# Patient Record
Sex: Male | Born: 1935 | Race: Black or African American | Hispanic: No | Marital: Married | Smoking: Former smoker
Health system: Southern US, Community
[De-identification: ages and names within clinical notes are randomized; demographics above are authoritative.]

## PROBLEM LIST (undated history)

## (undated) DIAGNOSIS — J9611 Chronic respiratory failure with hypoxia: Secondary | ICD-10-CM

## (undated) DIAGNOSIS — I10 Essential (primary) hypertension: Secondary | ICD-10-CM

## (undated) DIAGNOSIS — J9602 Acute respiratory failure with hypercapnia: Secondary | ICD-10-CM

## (undated) DIAGNOSIS — R748 Abnormal levels of other serum enzymes: Secondary | ICD-10-CM

## (undated) DIAGNOSIS — E7800 Pure hypercholesterolemia, unspecified: Secondary | ICD-10-CM

## (undated) DIAGNOSIS — R9431 Abnormal electrocardiogram [ECG] [EKG]: Secondary | ICD-10-CM

## (undated) DIAGNOSIS — D649 Anemia, unspecified: Secondary | ICD-10-CM

## (undated) DIAGNOSIS — M549 Dorsalgia, unspecified: Secondary | ICD-10-CM

## (undated) DIAGNOSIS — I639 Cerebral infarction, unspecified: Secondary | ICD-10-CM

## (undated) DIAGNOSIS — J189 Pneumonia, unspecified organism: Secondary | ICD-10-CM

## (undated) DIAGNOSIS — G8929 Other chronic pain: Secondary | ICD-10-CM

## (undated) DIAGNOSIS — J449 Chronic obstructive pulmonary disease, unspecified: Secondary | ICD-10-CM

## (undated) HISTORY — PX: INGUINAL HERNIA REPAIR: SUR1180

## (undated) HISTORY — PX: OTHER SURGICAL HISTORY: SHX169

---

## 2011-10-06 DIAGNOSIS — J9602 Acute respiratory failure with hypercapnia: Secondary | ICD-10-CM

## 2011-10-06 HISTORY — DX: Acute respiratory failure with hypercapnia: J96.02

## 2011-10-12 ENCOUNTER — Encounter (HOSPITAL_COMMUNITY): Payer: Self-pay | Admitting: Emergency Medicine

## 2011-10-12 ENCOUNTER — Emergency Department (HOSPITAL_COMMUNITY): Payer: Medicare PPO

## 2011-10-12 ENCOUNTER — Inpatient Hospital Stay (HOSPITAL_COMMUNITY)
Admission: EM | Admit: 2011-10-12 | Discharge: 2011-10-24 | DRG: 207 | Disposition: A | Discharge status: Skilled Nursing Facility | Payer: Medicare PPO | Attending: Internal Medicine | Admitting: Internal Medicine

## 2011-10-12 DIAGNOSIS — R0789 Other chest pain: Secondary | ICD-10-CM | POA: Diagnosis present

## 2011-10-12 DIAGNOSIS — N179 Acute kidney failure, unspecified: Secondary | ICD-10-CM

## 2011-10-12 DIAGNOSIS — G9340 Encephalopathy, unspecified: Secondary | ICD-10-CM

## 2011-10-12 DIAGNOSIS — R5383 Other fatigue: Secondary | ICD-10-CM

## 2011-10-12 DIAGNOSIS — J9602 Acute respiratory failure with hypercapnia: Secondary | ICD-10-CM

## 2011-10-12 DIAGNOSIS — R0902 Hypoxemia: Secondary | ICD-10-CM

## 2011-10-12 DIAGNOSIS — R748 Abnormal levels of other serum enzymes: Secondary | ICD-10-CM

## 2011-10-12 DIAGNOSIS — R7309 Other abnormal glucose: Secondary | ICD-10-CM | POA: Diagnosis not present

## 2011-10-12 DIAGNOSIS — J9611 Chronic respiratory failure with hypoxia: Secondary | ICD-10-CM

## 2011-10-12 DIAGNOSIS — R3989 Other symptoms and signs involving the genitourinary system: Secondary | ICD-10-CM | POA: Diagnosis present

## 2011-10-12 DIAGNOSIS — Z9981 Dependence on supplemental oxygen: Secondary | ICD-10-CM

## 2011-10-12 DIAGNOSIS — J9610 Chronic respiratory failure, unspecified whether with hypoxia or hypercapnia: Secondary | ICD-10-CM

## 2011-10-12 DIAGNOSIS — E870 Hyperosmolality and hypernatremia: Secondary | ICD-10-CM

## 2011-10-12 DIAGNOSIS — D649 Anemia, unspecified: Secondary | ICD-10-CM | POA: Diagnosis present

## 2011-10-12 DIAGNOSIS — R071 Chest pain on breathing: Secondary | ICD-10-CM | POA: Diagnosis present

## 2011-10-12 DIAGNOSIS — Z79899 Other long term (current) drug therapy: Secondary | ICD-10-CM

## 2011-10-12 DIAGNOSIS — J189 Pneumonia, unspecified organism: Secondary | ICD-10-CM

## 2011-10-12 DIAGNOSIS — Z7982 Long term (current) use of aspirin: Secondary | ICD-10-CM

## 2011-10-12 DIAGNOSIS — J329 Chronic sinusitis, unspecified: Secondary | ICD-10-CM

## 2011-10-12 DIAGNOSIS — J9620 Acute and chronic respiratory failure, unspecified whether with hypoxia or hypercapnia: Secondary | ICD-10-CM | POA: Diagnosis present

## 2011-10-12 DIAGNOSIS — I10 Essential (primary) hypertension: Secondary | ICD-10-CM

## 2011-10-12 DIAGNOSIS — E871 Hypo-osmolality and hyponatremia: Secondary | ICD-10-CM | POA: Diagnosis present

## 2011-10-12 DIAGNOSIS — Z87891 Personal history of nicotine dependence: Secondary | ICD-10-CM

## 2011-10-12 DIAGNOSIS — E872 Acidosis: Secondary | ICD-10-CM

## 2011-10-12 DIAGNOSIS — T380X5A Adverse effect of glucocorticoids and synthetic analogues, initial encounter: Secondary | ICD-10-CM | POA: Diagnosis not present

## 2011-10-12 DIAGNOSIS — G8929 Other chronic pain: Secondary | ICD-10-CM

## 2011-10-12 DIAGNOSIS — M6282 Rhabdomyolysis: Secondary | ICD-10-CM

## 2011-10-12 DIAGNOSIS — R000 Tachycardia, unspecified: Secondary | ICD-10-CM

## 2011-10-12 DIAGNOSIS — J9600 Acute respiratory failure, unspecified whether with hypoxia or hypercapnia: Secondary | ICD-10-CM

## 2011-10-12 DIAGNOSIS — J441 Chronic obstructive pulmonary disease with (acute) exacerbation: Principal | ICD-10-CM

## 2011-10-12 DIAGNOSIS — M549 Dorsalgia, unspecified: Secondary | ICD-10-CM | POA: Diagnosis present

## 2011-10-12 DIAGNOSIS — T50905A Adverse effect of unspecified drugs, medicaments and biological substances, initial encounter: Secondary | ICD-10-CM

## 2011-10-12 DIAGNOSIS — R9431 Abnormal electrocardiogram [ECG] [EKG]: Secondary | ICD-10-CM

## 2011-10-12 DIAGNOSIS — R739 Hyperglycemia, unspecified: Secondary | ICD-10-CM | POA: Diagnosis not present

## 2011-10-12 DIAGNOSIS — E875 Hyperkalemia: Secondary | ICD-10-CM | POA: Diagnosis present

## 2011-10-12 DIAGNOSIS — R1310 Dysphagia, unspecified: Secondary | ICD-10-CM

## 2011-10-12 DIAGNOSIS — R5381 Other malaise: Secondary | ICD-10-CM

## 2011-10-12 HISTORY — DX: Anemia, unspecified: D64.9

## 2011-10-12 HISTORY — DX: Dorsalgia, unspecified: M54.9

## 2011-10-12 HISTORY — DX: Essential (primary) hypertension: I10

## 2011-10-12 HISTORY — DX: Cerebral infarction, unspecified: I63.9

## 2011-10-12 HISTORY — DX: Other chronic pain: G89.29

## 2011-10-12 HISTORY — DX: Acute respiratory failure with hypercapnia: J96.02

## 2011-10-12 HISTORY — DX: Abnormal levels of other serum enzymes: R74.8

## 2011-10-12 HISTORY — DX: Abnormal electrocardiogram (ECG) (EKG): R94.31

## 2011-10-12 HISTORY — DX: Chronic obstructive pulmonary disease, unspecified: J44.9

## 2011-10-12 HISTORY — DX: Pure hypercholesterolemia, unspecified: E78.00

## 2011-10-12 HISTORY — DX: Chronic respiratory failure with hypoxia: J96.11

## 2011-10-12 LAB — COMPREHENSIVE METABOLIC PANEL
ALT: 27 U/L (ref 0–53)
Alkaline Phosphatase: 63 U/L (ref 39–117)
BUN: 19 mg/dL (ref 6–23)
CO2: 33 mEq/L — ABNORMAL HIGH (ref 19–32)
Chloride: 90 mEq/L — ABNORMAL LOW (ref 96–112)
GFR calc Af Amer: 63 mL/min — ABNORMAL LOW (ref >90)
Glucose, Bld: 124 mg/dL — ABNORMAL HIGH (ref 70–99)
Potassium: 3.7 mEq/L (ref 3.5–5.1)
Sodium: 133 mEq/L — ABNORMAL LOW (ref 135–145)
Total Bilirubin: 0.6 mg/dL (ref 0.3–1.2)

## 2011-10-12 LAB — URINALYSIS, ROUTINE W REFLEX MICROSCOPIC
Bilirubin Urine: NEGATIVE
Ketones, ur: NEGATIVE mg/dL
Leukocytes, UA: NEGATIVE
Nitrite: NEGATIVE
Protein, ur: 100 mg/dL — AB
pH: 6 (ref 5.0–8.0)

## 2011-10-12 LAB — MRSA PCR SCREENING: MRSA by PCR: NEGATIVE

## 2011-10-12 LAB — CBC WITH DIFFERENTIAL/PLATELET
Eosinophils Absolute: 0.1 10*3/uL (ref 0.0–0.7)
Hemoglobin: 12.2 g/dL — ABNORMAL LOW (ref 13.0–17.0)
Lymphocytes Relative: 13 % (ref 12–46)
Lymphs Abs: 1.2 10*3/uL (ref 0.7–4.0)
MCH: 29.2 pg (ref 26.0–34.0)
Monocytes Relative: 8 % (ref 3–12)
Neutro Abs: 7.2 10*3/uL (ref 1.7–7.7)
Neutrophils Relative %: 78 % — ABNORMAL HIGH (ref 43–77)
Platelets: 164 10*3/uL (ref 150–400)
RBC: 4.18 MIL/uL — ABNORMAL LOW (ref 4.22–5.81)
WBC: 9.2 10*3/uL (ref 4.0–10.5)

## 2011-10-12 LAB — BLOOD GAS, ARTERIAL
Acid-Base Excess: 4.8 mmol/L — ABNORMAL HIGH (ref 0.0–2.0)
Bicarbonate: 30.7 mEq/L — ABNORMAL HIGH (ref 20.0–24.0)
Patient temperature: 37
TCO2: 25.5 mmol/L (ref 0–100)

## 2011-10-12 LAB — PRO B NATRIURETIC PEPTIDE: Pro B Natriuretic peptide (BNP): 408.7 pg/mL (ref 0–450)

## 2011-10-12 LAB — D-DIMER, QUANTITATIVE: D-Dimer, Quant: 0.87 ug/mL-FEU — ABNORMAL HIGH (ref 0.00–0.48)

## 2011-10-12 LAB — CARDIAC PANEL(CRET KIN+CKTOT+MB+TROPI)
Relative Index: 1.6 (ref 0.0–2.5)
Total CK: 1925 U/L — ABNORMAL HIGH (ref 7–232)
Troponin I: <0.3 ng/mL (ref <0.30)

## 2011-10-12 LAB — PROCALCITONIN: Procalcitonin: 0.12 ng/mL

## 2011-10-12 LAB — TROPONIN I: Troponin I: <0.3 ng/mL (ref <0.30)

## 2011-10-12 MED ORDER — LEVALBUTEROL HCL 0.63 MG/3ML IN NEBU
0.6300 mg | INHALATION_SOLUTION | RESPIRATORY_TRACT | Status: DC | PRN
  Administered 2011-10-18: 0.63 mg via RESPIRATORY_TRACT
  Filled 2011-10-12 (×2): qty 3

## 2011-10-12 MED ORDER — ACETAMINOPHEN 650 MG RE SUPP: 650.0000 mg | Freq: Four times a day (QID) | RECTAL | Status: DC | PRN

## 2011-10-12 MED ORDER — NABUMETONE 500 MG PO TABS
500.0000 mg | ORAL_TABLET | Freq: Two times a day (BID) | ORAL | Status: DC | PRN
  Administered 2011-10-12: 500 mg via ORAL
  Filled 2011-10-12 (×2): qty 1

## 2011-10-12 MED ORDER — ALBUTEROL SULFATE (5 MG/ML) 0.5% IN NEBU
INHALATION_SOLUTION | RESPIRATORY_TRACT | Status: AC
  Administered 2011-10-12: 10:00:00
  Filled 2011-10-12: qty 0.5

## 2011-10-12 MED ORDER — DOCUSATE SODIUM 100 MG PO CAPS
100.0000 mg | ORAL_CAPSULE | Freq: Two times a day (BID) | ORAL | Status: DC
  Administered 2011-10-12 – 2011-10-14 (×5): 100 mg via ORAL
  Filled 2011-10-12 (×8): qty 1

## 2011-10-12 MED ORDER — ACETAMINOPHEN 325 MG PO TABS
650.0000 mg | ORAL_TABLET | Freq: Four times a day (QID) | ORAL | Status: DC | PRN
  Administered 2011-10-18: 650 mg via ORAL
  Filled 2011-10-12: qty 2

## 2011-10-12 MED ORDER — OXYCODONE HCL 5 MG PO TABS
5.0000 mg | ORAL_TABLET | ORAL | Status: DC | PRN
  Administered 2011-10-14 – 2011-10-15 (×3): 5 mg via ORAL
  Filled 2011-10-12 (×4): qty 1

## 2011-10-12 MED ORDER — IPRATROPIUM BROMIDE 0.02 % IN SOLN
0.5000 mg | RESPIRATORY_TRACT | Status: DC
  Administered 2011-10-12 – 2011-10-24 (×69): 0.5 mg via RESPIRATORY_TRACT
  Filled 2011-10-12 (×70): qty 2.5

## 2011-10-12 MED ORDER — LEVALBUTEROL HCL 0.63 MG/3ML IN NEBU
0.6300 mg | INHALATION_SOLUTION | RESPIRATORY_TRACT | Status: DC
  Administered 2011-10-12 – 2011-10-24 (×70): 0.63 mg via RESPIRATORY_TRACT
  Filled 2011-10-12 (×70): qty 3

## 2011-10-12 MED ORDER — MAGNESIUM SULFATE 40 MG/ML IJ SOLN
2.0000 g | Freq: Once | INTRAMUSCULAR | Status: AC
  Administered 2011-10-12: 2 g via INTRAVENOUS
  Filled 2011-10-12: qty 50

## 2011-10-12 MED ORDER — ALBUTEROL (5 MG/ML) CONTINUOUS INHALATION SOLN
10.0000 mg/h | INHALATION_SOLUTION | Freq: Once | RESPIRATORY_TRACT | Status: AC
  Administered 2011-10-14: 10 mg/h via RESPIRATORY_TRACT
  Filled 2011-10-12: qty 20

## 2011-10-12 MED ORDER — IPRATROPIUM BROMIDE 0.02 % IN SOLN
RESPIRATORY_TRACT | Status: AC
  Filled 2011-10-12: qty 2.5

## 2011-10-12 MED ORDER — METHYLPREDNISOLONE SODIUM SUCC 125 MG IJ SOLR
INTRAMUSCULAR | Status: AC
  Administered 2011-10-12: 80 mg via INTRAVENOUS
  Filled 2011-10-12: qty 2

## 2011-10-12 MED ORDER — GUAIFENESIN ER 600 MG PO TB12
600.0000 mg | ORAL_TABLET | Freq: Two times a day (BID) | ORAL | Status: DC
  Administered 2011-10-12 – 2011-10-14 (×5): 600 mg via ORAL
  Filled 2011-10-12 (×8): qty 1

## 2011-10-12 MED ORDER — AMLODIPINE BESYLATE 5 MG PO TABS
5.0000 mg | ORAL_TABLET | Freq: Every day | ORAL | Status: DC
  Administered 2011-10-12 – 2011-10-14 (×3): 5 mg via ORAL
  Filled 2011-10-12 (×3): qty 1

## 2011-10-12 MED ORDER — METHYLPREDNISOLONE SODIUM SUCC 125 MG IJ SOLR: 125.0000 mg | Freq: Once | INTRAMUSCULAR | Status: DC

## 2011-10-12 MED ORDER — ONDANSETRON HCL 4 MG/2ML IJ SOLN: 4.0000 mg | Freq: Four times a day (QID) | INTRAMUSCULAR | Status: DC | PRN

## 2011-10-12 MED ORDER — IOHEXOL 350 MG/ML SOLN
100.0000 mL | Freq: Once | INTRAVENOUS | Status: AC | PRN
  Administered 2011-10-12: 100 mL via INTRAVENOUS

## 2011-10-12 MED ORDER — ALUM & MAG HYDROXIDE-SIMETH 200-200-20 MG/5ML PO SUSP: 30.0000 mL | Freq: Four times a day (QID) | ORAL | Status: DC | PRN

## 2011-10-12 MED ORDER — POTASSIUM CHLORIDE IN NACL 20-0.9 MEQ/L-% IV SOLN
INTRAVENOUS | Status: DC
  Administered 2011-10-12 – 2011-10-13 (×3): via INTRAVENOUS

## 2011-10-12 MED ORDER — LOSARTAN POTASSIUM 50 MG PO TABS
100.0000 mg | ORAL_TABLET | Freq: Every day | ORAL | Status: DC
  Administered 2011-10-12 – 2011-10-14 (×3): 100 mg via ORAL
  Filled 2011-10-12: qty 2
  Filled 2011-10-12: qty 1
  Filled 2011-10-12 (×2): qty 2
  Filled 2011-10-12: qty 1

## 2011-10-12 MED ORDER — GABAPENTIN 300 MG PO CAPS
300.0000 mg | ORAL_CAPSULE | Freq: Three times a day (TID) | ORAL | Status: DC
  Administered 2011-10-12 – 2011-10-14 (×6): 300 mg via ORAL
  Filled 2011-10-12 (×9): qty 1

## 2011-10-12 MED ORDER — CLONIDINE HCL 0.1 MG PO TABS
0.1000 mg | ORAL_TABLET | Freq: Every day | ORAL | Status: DC
  Administered 2011-10-12 – 2011-10-13 (×2): 0.1 mg via ORAL
  Filled 2011-10-12 (×2): qty 1

## 2011-10-12 MED ORDER — FAMOTIDINE 20 MG PO TABS
20.0000 mg | ORAL_TABLET | Freq: Two times a day (BID) | ORAL | Status: DC
  Administered 2011-10-12 – 2011-10-14 (×5): 20 mg via ORAL
  Filled 2011-10-12 (×5): qty 1

## 2011-10-12 MED ORDER — ASPIRIN EC 81 MG PO TBEC
81.0000 mg | DELAYED_RELEASE_TABLET | Freq: Every day | ORAL | Status: DC
  Administered 2011-10-12 – 2011-10-14 (×3): 81 mg via ORAL
  Filled 2011-10-12 (×5): qty 1

## 2011-10-12 MED ORDER — SIMVASTATIN 20 MG PO TABS
20.0000 mg | ORAL_TABLET | Freq: Every day | ORAL | Status: DC
  Administered 2011-10-12: 20 mg via ORAL
  Filled 2011-10-12 (×2): qty 1

## 2011-10-12 MED ORDER — METHYLPREDNISOLONE SODIUM SUCC 125 MG IJ SOLR
80.0000 mg | Freq: Four times a day (QID) | INTRAMUSCULAR | Status: DC
  Administered 2011-10-12 – 2011-10-17 (×20): 80 mg via INTRAVENOUS
  Filled 2011-10-12 (×19): qty 2

## 2011-10-12 MED ORDER — MAGNESIUM SULFATE 50 % IJ SOLN
2.0000 g | Freq: Once | INTRAVENOUS | Status: DC
  Filled 2011-10-12: qty 4

## 2011-10-12 MED ORDER — ENOXAPARIN SODIUM 40 MG/0.4ML ~~LOC~~ SOLN
40.0000 mg | SUBCUTANEOUS | Status: DC
  Administered 2011-10-12 – 2011-10-23 (×12): 40 mg via SUBCUTANEOUS
  Filled 2011-10-12 (×12): qty 0.4

## 2011-10-12 MED ORDER — SODIUM CHLORIDE 0.9 % IJ SOLN
INTRAMUSCULAR | Status: AC
  Filled 2011-10-12: qty 6

## 2011-10-12 MED ORDER — BENZONATATE 100 MG PO CAPS
100.0000 mg | ORAL_CAPSULE | Freq: Three times a day (TID) | ORAL | Status: DC
  Administered 2011-10-12 – 2011-10-14 (×6): 100 mg via ORAL
  Filled 2011-10-12 (×7): qty 1

## 2011-10-12 MED ORDER — DEXTROSE 5 % IV SOLN
500.0000 mg | INTRAVENOUS | Status: DC
  Administered 2011-10-12 – 2011-10-17 (×6): 500 mg via INTRAVENOUS
  Filled 2011-10-12 (×9): qty 500

## 2011-10-12 MED ORDER — CEFTRIAXONE SODIUM 1 G IJ SOLR
1.0000 g | INTRAMUSCULAR | Status: DC
  Administered 2011-10-12 – 2011-10-23 (×12): 1 g via INTRAVENOUS
  Filled 2011-10-12 (×15): qty 10

## 2011-10-12 MED ORDER — SODIUM CHLORIDE 0.9 % IV SOLN
INTRAVENOUS | Status: DC
  Administered 2011-10-12: 10:00:00 via INTRAVENOUS

## 2011-10-12 MED ORDER — ONDANSETRON HCL 4 MG PO TABS: 4.0000 mg | ORAL_TABLET | Freq: Four times a day (QID) | ORAL | Status: DC | PRN

## 2011-10-12 NOTE — ED Notes (Signed)
CRITICAL VALUE ALERT  Critical value received:  Ph 7.30  CO2 63.2  PO2 62.2  so2 88.5  Bicarb 30.7  Date of notification:  10/12/2011  Time of notification:  1407  Critical value read back:yes  Nurse who received alert:  c Yariel Ferraris rn  MD notified (1st page):  Dr Sherrie Mustache  Time of first page:  1407  MD notified (2nd page):  Time of second page:  Responding MD:  Dr Sherrie Mustache  Time MD responded:  [phone]

## 2011-10-12 NOTE — H&P (Signed)
Harold Johnson MRN: [id number] DOB/AGE: [birth year] 77 y.o. Primary Care Physician: Samuel Jester, M.D. Admit date: 10/12/2011 Chief Complaint: Shortness of breath, worsening wheezing, and cough. HPI: The patient is a 77 year old man with a history significant for oxygen-dependent COPD, hypertension, and chronic low back pain, who presents to the emergency department today with a chief complaint of worsening shortness of breath and cough. He wheezes daily for the most part, however, he has had more wheezing and chest congestion over the past week. He has had worsening shortness of breath at rest and with ambulation. He denies orthopnea. He has had an intermittently dry cough and a productive cough with green to brown sputum. He has had subjective fever but no subjective chills. He has had chest wall pain when he coughs. He denies any associated nausea, vomiting, diarrhea, abdominal pain, blood in his stools, constipation, diarrhea, or pain with urination. He has never been intubated. He stopped smoking more than 10 years ago. He wears his oxygen almost 24 hours daily. He uses his albuterol nebulizer every 6 hours, but he did not increase its use the with worsening wheezing.  In route to the emergency department, apparently the EMT gave him 125 mg of Solu-Medrol and an albuterol nebulization. On arrival to the emergency department, the patient's oxygen saturations were in the upper 80s to low 90s on 2 L of oxygen. He is currently oxygenating 92%. He is tachycardic with a heart rate ranging from 110-124. His chest x-ray revealed COPD/emphysema, but no acute cardiopulmonary disease. His ABG on 2 L of oxygen reveals a pH of 7.3, PCO2 of 63, and PO2 of 62. His sodium is 133, CO2 was 33, glucose is 124, and his troponin I is within normal limits. A d-dimer was ordered by the emergency department physician. It was noted to be elevated. Subsequently, CT angiogram scan of his chest was ordered. It revealed no evidence of  pulmonary emboli but with a small amount of patchy opacity at the right lung base. He is being admitted for further evaluation and management.  Past Medical History  Diagnosis Date  . COPD (chronic obstructive pulmonary disease)   . Hypertension   . Hypercholesteremia   . Stroke   . Chronic respiratory failure with hypoxia   . Chronic back pain     Past Surgical History  Procedure Date  . Fatty tumor     Excision from left shoulder.  . Inguinal hernia repair     Prior to Admission medications   Medication Sig Start Date End Date Taking? Authorizing Provider  amLODipine (NORVASC) 5 MG tablet Take 5 mg by mouth daily.   Yes Historical Provider, MD  aspirin EC 81 MG tablet Take 81 mg by mouth daily.   Yes Historical Provider, MD  cloNIDine (CATAPRES) 0.1 MG tablet Take 0.1 mg by mouth at bedtime.   Yes Historical Provider, MD  gabapentin (NEURONTIN) 300 MG capsule Take 300 mg by mouth 3 (three) times daily.   Yes Historical Provider, MD  losartan-hydrochlorothiazide (HYZAAR) 100-25 MG per tablet Take 1 tablet by mouth daily.   Yes Historical Provider, MD  nabumetone (RELAFEN) 500 MG tablet Take 500 mg by mouth 2 (two) times daily.   Yes Historical Provider, MD  simvastatin (ZOCOR) 20 MG tablet Take 20 mg by mouth at bedtime.   Yes Historical Provider, MD    Allergies: No Known Allergies  Family history: His mother died at a young age. Etiology unsure, however, he thinks that she may have died of  a stroke. His father died of an enlarged heart, query congestive heart failure.  Social History: He is married. He has no children. He lives in [place]. He is retired. After smoking for 45-50 years, he stopped smoking 10 years ago. He stopped drinking alcohol in 1996. He denies illicit drug use.  reports that he has quit smoking.    ROS: As above in history present illness. In addition, he has chronic back pain with some radiation of the pain to his legs. Occasionally has numbness and  tingling in his legs. Otherwise, review of systems is negative.  PHYSICAL EXAM: Blood pressure 156/71, pulse 124, temperature 98.5 F (36.9 C), temperature source Oral, resp. rate 22, height 5\' 9"  (1.753 m), weight 96.163 kg (212 lb), SpO2 89.00%. General: Pleasant alert 77 year old African-American man laying in bed, in no acute distress. HEENT: Head is normocephalic, nontraumatic. Pupils are equal, round, and reactive to light. Extraocular movements are intact. Conjunctivae are clear. Sclerae are white. Tympanic membranes obscured by cerumen bilaterally. Nasal mucosa is dry. No sinus tenderness. Oropharynx reveals fair dentition. Mucous membranes are mildly dry. No posterior exudates or erythema. Neck: Supple, no adenopathy, no thyromegaly, no JVD. Lungs: Bilateral diffuse wheezes throughout all lung fields. Breath sounds audible down to the bases. Heart: S1, S2, with tachycardia. Abdomen: Mildly obese, protuberant, soft, nontender, nondistended. Extremities: Pedal pulses are palpable bilaterally. No pedal edema. No acute hot red joints. Neurologic: Cranial nerves II through XII are intact. Strength is 5 over 5 throughout. Sensation is grossly intact. Psychiatric: He is alert and oriented x3. His speech is clear. He is cooperative. His affect is pleasant.  Basic Metabolic Panel:  Basename 10/12/11 0958  NA 133*  K 3.7  CL 90*  CO2 33*  GLUCOSE 124*  BUN 19  CREATININE 1.26  CALCIUM 10.6*  MG --  PHOS --   Liver Function Tests:  Basename 10/12/11 0958  AST 52*  ALT 27  ALKPHOS 63  BILITOT 0.6  PROT 8.6*  ALBUMIN 3.9   No results found for this basename: LIPASE:2,AMYLASE:2 in the last 72 hours No results found for this basename: AMMONIA:2 in the last 72 hours CBC:  Basename 10/12/11 0958  WBC 9.2  NEUTROABS 7.2  HGB 12.2*  HCT 38.5*  MCV 92.1  PLT 164   Cardiac Enzymes:  Basename 10/12/11 1000  CKTOTAL --  CKMB --  CKMBINDEX --  TROPONINI <0.30    BNP:  Basename 10/12/11 0958  PROBNP 408.7   D-Dimer:  Alvira Philips 10/12/11 0958  DDIMER 0.87*   CBG: No results found for this basename: GLUCAP:6 in the last 72 hours Hemoglobin A1C: No results found for this basename: HGBA1C in the last 72 hours Fasting Lipid Panel: No results found for this basename: CHOL,HDL,LDLCALC,TRIG,CHOLHDL,LDLDIRECT in the last 72 hours Thyroid Function Tests: No results found for this basename: TSH,T4TOTAL,FREET4,T3FREE,THYROIDAB in the last 72 hours Anemia Panel: No results found for this basename: VITAMINB12,FOLATE,FERRITIN,TIBC,IRON,RETICCTPCT in the last 72 hours Coagulation: No results found for this basename: LABPROT:2,INR:2 in the last 72 hours Urine Drug Screen: Drugs of Abuse  No results found for this basename: labopia,  cocainscrnur,  labbenz,  amphetmu,  thcu,  labbarb    Alcohol Level: No results found for this basename: ETH:2 in the last 72 hours Urinalysis:  Basename 10/12/11 1019  COLORURINE YELLOW  LABSPEC >1.030*  PHURINE 6.0  GLUCOSEU NEGATIVE  HGBUR LARGE*  BILIRUBINUR NEGATIVE  KETONESUR NEGATIVE  PROTEINUR 100*  UROBILINOGEN 0.2  NITRITE NEGATIVE  LEUKOCYTESUR NEGATIVE  Misc. Labs:   EKG: Sinus tachycardia, heart rate 119 beats per minute, right axis deviation, nonspecific T-wave abnormalities.  Recent Results (from the past 240 hour(s))  CULTURE, BLOOD (ROUTINE X 2)     Status: Normal (Preliminary result)   Collection Time   10/12/11  9:59 AM      Component Value Range Status Comment   Specimen Description BLOOD LEFT WRIST   Final    Special Requests BOTTLES DRAWN AEROBIC AND ANAEROBIC 8CC   Final    Culture NO GROWTH <24 HRS   Final    Report Status PENDING   Incomplete   CULTURE, BLOOD (ROUTINE X 2)     Status: Normal (Preliminary result)   Collection Time   10/12/11 10:03 AM      Component Value Range Status Comment   Specimen Description BLOOD RIGHT HAND   Final    Special Requests BOTTLES DRAWN AEROBIC  AND ANAEROBIC 8CC   Final    Culture NO GROWTH <24 HRS   Final    Report Status PENDING   Incomplete      Results for orders placed during the hospital encounter of 10/12/11 (from the past 48 hour(s))  CBC WITH DIFFERENTIAL     Status: Abnormal   Collection Time   10/12/11  9:58 AM      Component Value Range Comment   WBC 9.2  4.0 - 10.5 K/uL    RBC 4.18 (*) 4.22 - 5.81 MIL/uL    Hemoglobin 12.2 (*) 13.0 - 17.0 g/dL    HCT 16.1 (*) 09.6 - 52.0 %    MCV 92.1  78.0 - 100.0 fL    MCH 29.2  26.0 - 34.0 pg    MCHC 31.7  30.0 - 36.0 g/dL    RDW 04.5  40.9 - 81.1 %    Platelets 164  150 - 400 K/uL    Neutrophils Relative 78 (*) 43 - 77 %    Neutro Abs 7.2  1.7 - 7.7 K/uL    Lymphocytes Relative 13  12 - 46 %    Lymphs Abs 1.2  0.7 - 4.0 K/uL    Monocytes Relative 8  3 - 12 %    Monocytes Absolute 0.8  0.1 - 1.0 K/uL    Eosinophils Relative 1  0 - 5 %    Eosinophils Absolute 0.1  0.0 - 0.7 K/uL    Basophils Relative 0  0 - 1 %    Basophils Absolute 0.0  0.0 - 0.1 K/uL   COMPREHENSIVE METABOLIC PANEL     Status: Abnormal   Collection Time   10/12/11  9:58 AM      Component Value Range Comment   Sodium 133 (*) 135 - 145 mEq/L    Potassium 3.7  3.5 - 5.1 mEq/L    Chloride 90 (*) 96 - 112 mEq/L    CO2 33 (*) 19 - 32 mEq/L    Glucose, Bld 124 (*) 70 - 99 mg/dL    BUN 19  6 - 23 mg/dL    Creatinine, Ser 9.14  0.50 - 1.35 mg/dL    Calcium 78.2 (*) 8.4 - 10.5 mg/dL    Total Protein 8.6 (*) 6.0 - 8.3 g/dL    Albumin 3.9  3.5 - 5.2 g/dL    AST 52 (*) 0 - 37 U/L    ALT 27  0 - 53 U/L    Alkaline Phosphatase 63  39 - 117 U/L    Total Bilirubin  0.6  0.3 - 1.2 mg/dL    GFR calc non Af Amer 54 (*) >90 mL/min    GFR calc Af Amer 63 (*) >90 mL/min   PRO B NATRIURETIC PEPTIDE     Status: Normal   Collection Time   10/12/11  9:58 AM      Component Value Range Comment   Pro B Natriuretic peptide (BNP) 408.7  0 - 450 pg/mL   D-DIMER, QUANTITATIVE     Status: Abnormal   Collection Time   10/12/11   9:58 AM      Component Value Range Comment   D-Dimer, Quant 0.87 (*) 0.00 - 0.48 ug/mL-FEU   LACTIC ACID, PLASMA     Status: Normal   Collection Time   10/12/11  9:59 AM      Component Value Range Comment   Lactic Acid, Venous 1.5  0.5 - 2.2 mmol/L   PROCALCITONIN     Status: Normal   Collection Time   10/12/11  9:59 AM      Component Value Range Comment   Procalcitonin 0.12     CULTURE, BLOOD (ROUTINE X 2)     Status: Normal (Preliminary result)   Collection Time   10/12/11  9:59 AM      Component Value Range Comment   Specimen Description BLOOD LEFT WRIST      Special Requests BOTTLES DRAWN AEROBIC AND ANAEROBIC 8CC      Culture NO GROWTH <24 HRS      Report Status PENDING     TROPONIN I     Status: Normal   Collection Time   10/12/11 10:00 AM      Component Value Range Comment   Troponin I <0.30  <0.30 ng/mL   CULTURE, BLOOD (ROUTINE X 2)     Status: Normal (Preliminary result)   Collection Time   10/12/11 10:03 AM      Component Value Range Comment   Specimen Description BLOOD RIGHT HAND      Special Requests BOTTLES DRAWN AEROBIC AND ANAEROBIC 8CC      Culture NO GROWTH <24 HRS      Report Status PENDING     URINALYSIS, ROUTINE W REFLEX MICROSCOPIC     Status: Abnormal   Collection Time   10/12/11 10:19 AM      Component Value Range Comment   Color, Urine YELLOW  YELLOW    APPearance CLEAR  CLEAR    Specific Gravity, Urine >1.030 (*) 1.005 - 1.030    pH 6.0  5.0 - 8.0    Glucose, UA NEGATIVE  NEGATIVE mg/dL    Hgb urine dipstick LARGE (*) NEGATIVE    Bilirubin Urine NEGATIVE  NEGATIVE    Ketones, ur NEGATIVE  NEGATIVE mg/dL    Protein, ur 161 (*) NEGATIVE mg/dL    Urobilinogen, UA 0.2  0.0 - 1.0 mg/dL    Nitrite NEGATIVE  NEGATIVE    Leukocytes, UA NEGATIVE  NEGATIVE   URINE MICROSCOPIC-ADD ON     Status: Normal   Collection Time   10/12/11 10:19 AM      Component Value Range Comment   Squamous Epithelial / LPF RARE  RARE    RBC / HPF 0-2  <3 RBC/hpf   BLOOD GAS,  ARTERIAL     Status: Abnormal   Collection Time   10/12/11  1:58 PM      Component Value Range Comment   O2 Content 2.0      pH, Arterial 7.307 (*) 7.350 -  7.450    pCO2 arterial 63.2 (*) 35.0 - 45.0 mmHg    pO2, Arterial 62.2 (*) 80.0 - 100.0 mmHg    Bicarbonate 30.7 (*) 20.0 - 24.0 mEq/L    TCO2 25.5  0 - 100 mmol/L    Acid-Base Excess 4.8 (*) 0.0 - 2.0 mmol/L    O2 Saturation 88.5      Patient temperature 37.0      Collection site RIGHT RADIAL      Drawn by COLLECTED BY RT      Sample type ARTERIAL      Allens test (pass/fail) PASS  PASS     Ct Angio Chest W/cm &/or Wo Cm  10/12/2011  *RADIOLOGY REPORT*  Clinical Data: Shortness of breath.  Elevated D-dimer.  CT ANGIOGRAPHY CHEST  Technique:  Multidetector CT imaging of the chest using the standard protocol during bolus administration of intravenous contrast. Multiplanar reconstructed images including MIPs were obtained and reviewed to evaluate the vascular anatomy.  Contrast: OMNIPAQUE IOHEXOL 350 MG/ML SOLN  Comparison: Portable chest obtained earlier today.  Findings: Normally opacified pulmonary arteries with no pulmonary arterial filling defects.  Airspace opacity in the inferior, posterior aspect of the right lower lobe.  The remainder of the lungs are clear.  Minimally prominent right inferior hilar lymph nodes.  No lung masses.  Two small areas of low density in the liver, not included in their entirety.  No definite associated enhancement visualized.  IMPRESSION:  1.  No pulmonary emboli. 2.  Small amount of patchy opacity at the right lung base.  This could represent pneumonia or patchy atelectasis. 3.  Minimally prominent right inferior hilar lymph nodes, most likely reactive. 4.  Two small probable cysts in the liver.  Original Report Authenticated By: Darrol Angel, M.D.   Dg Chest Port 1 View  10/12/2011  *RADIOLOGY REPORT*  Clinical Data: 1-week history of shortness of breath, cough, and fever.  History of oxygen dependent  COPD.  PORTABLE CHEST - 1 VIEW 10/12/2011 0949 hours:  Comparison: None.  Findings: Emphysematous changes in the upper lobes.  Suboptimal inspiration which accounts for crowded bronchovascular markings in the lung bases.  Cardiac silhouette mildly enlarged for technique and degree of inspiration.  Lungs clear.  No visible pleural effusions.  IMPRESSION: Suboptimal inspiration.  COPD/emphysema.  No acute cardiopulmonary disease.  Original Report Authenticated By: Arnell Sieving, M.D.    Impression:  Principal Problem:  *COPD with acute exacerbation Active Problems:  Chronic respiratory failure with hypoxia  Acute respiratory failure with hypercapnia  PNA (pneumonia)  Tachycardia  Right Axis Deviation  Hyponatremia  Anemia  Chronic back pain  Chest wall pain  Hypertension    1. COPD with acute exacerbation and concomitant community-acquired pneumonia.  2. Acute on chronic hypoxic respiratory failure with concomitant hypercapnia/respiratory acidosis.  3. Hyponatremia, likely secondary to hypovolemia and possibly dehydration. His urine specific gravity appears to be elevated.  4. Sinus tachycardia, likely secondary to acute pulmonary infection/COPD exacerbation, nebulizers, and Solu-Medrol.  5. Chest wall pain, secondary to coughing.  6. Normocytic anemia.  7. Chronic low back pain. He is treated chronically with gabapentin and nabumetone.  8. Hypertension. History chronically with amlodipine, clonidine, and losartan/HCTZ.  Plan:  1. We'll admit the patient to the step down unit for closer monitoring. 2. He was given Solu-Medrol by EMT and multiple albuterol nebulizations in the emergency department. 3. We'll continue IV steroids and Xopenex/Atrovent nebulizations. We'll add Rocephin and azithromycin. We'll continue oxygen therapy at  2-3 L per minute. We'll consider BiPAP if needed. We'll add Tessalon Perles 3 times a day for cough. Will also add Mucinex. We'll give 2 g of  magnesium sulfate IV to hopefully decrease the extent of bronchospasms. 4. Analgesics as needed for pain. 5. IV fluid hydration with normal saline. We'll hold hydrochlorothiazide. 6. We'll start Pepcid prophylactically while on steroids and history of chronic NSAID use. 7. For further evaluation, we'll check cardiac enzymes, anemia panel, TSH, free T4.   Total critical care time 1 hour 15 minutes.   Aili Casillas 10/12/2011, 2:58 PM

## 2011-10-12 NOTE — ED Notes (Signed)
Pt c/o sob and cough x 1 week. Pt has been taking breathing tx's at home with not much relief. 02 2l [place] at home constant. Pt is alert/oriented. Pt c/o being hot also x 1 week. Sob much worse with transfer from ems stretcher to ours. Pt denies pain. No swelling noted to extremities or abd. Slight accessory muscle use noted. Lung sounds wheezing through out with slight rub to r lower/lateral area. sputum is light brown in color. Nondiaphoretic.

## 2011-10-12 NOTE — ED Provider Notes (Addendum)
History    This chart was scribed for Harold Human, MD, MD by Smitty Pluck. The patient was seen in room APA18 and the patient's care was started at 9:39AM.   CSN: [id number]  Arrival date & time 10/12/11  7829   First MD Initiated Contact with Patient 10/12/11 [phone]      Chief Complaint  Patient presents with  . Shortness of Breath  . Cough    (Consider location/radiation/quality/duration/timing/severity/associated sxs/prior treatment) Patient is a 77 y.o. male presenting with shortness of breath and cough. The history is provided by the patient. No language interpreter was used.  Shortness of Breath  The current episode started more than 1 week ago. The problem occurs continuously. The problem has been gradually worsening. The problem is severe. Nothing (He is on home oxygen 2 liters per minute.) relieves the symptoms. The symptoms are aggravated by activity. Associated symptoms include cough, shortness of breath and wheezing. Pertinent negatives include no chest pain. He was not exposed to toxic fumes. Past medical history comments: COPD. Marland Kitchen  Cough Associated symptoms include shortness of breath and wheezing. Pertinent negatives include no chest pain. Past medical history comments: COPD. Harold Johnson is a 77 y.o. male who presents to the Emergency Department BIB EMS complaining of moderate productive cough with green sputum with associated chest pain and SOB onset 1 week ago. Pt reports that he uses 2L of O2 at all times. He reports using breathing treatments at home without relief. Pt reports that he has been hot lately. Pt reports hx of COPD. Denies MI and heart complications. Pt reports that he does not drink alcohol or smoke any more. Symptoms have been constant since onset without radiation.  PCP is Dr.   No past medical history on file.  No past surgical history on file.  No family history on file.  History  Substance Use Topics  . Smoking status: Not on file  .  Smokeless tobacco: Not on file  . Alcohol Use: Not on file      Review of Systems  Constitutional: Negative.        Felt warm, sweated a lot.  Did not measure his temperature.  HENT: Negative.   Eyes: Negative.   Respiratory: Positive for cough, shortness of breath and wheezing.   Cardiovascular: Negative.  Negative for chest pain and leg swelling.  Gastrointestinal: Negative.   Genitourinary: Negative.   Musculoskeletal: Negative.   Skin:       Episodes of sweating.  Neurological: Negative.   Psychiatric/Behavioral: Negative.   All other systems reviewed and are negative.   10 Systems reviewed and all are negative for acute change except as noted in the HPI.   Allergies  Review of patient's allergies indicates not on file.  Home Medications  No current outpatient prescriptions on file.  There were no vitals taken for this visit.  Physical Exam  Nursing note and vitals reviewed. Constitutional: He is oriented to person, place, and time. He appears well-developed and well-nourished.       Elderly man, breathing oxygen. No distress at rest.  HENT:  Head: Normocephalic and atraumatic.  Neck: Normal range of motion. Neck supple.  Cardiovascular: Tachycardia present.   No murmur heard. Pulmonary/Chest: Effort normal. He has wheezes.       Wheezes heard over both lung fields.  Abdominal: Soft. There is no tenderness.  Musculoskeletal: He exhibits no edema.  Neurological: He is alert and oriented to person, place, and time.  Skin: Skin is warm and dry.  Psychiatric: He has a normal mood and affect. His behavior is normal.    ED Course  Procedures (including critical care time) DIAGNOSTIC STUDIES: Oxygen Saturation is 94% on [place], normal by my interpretation.    COORDINATION OF CARE: 9:44AM EDP discusses pt ED treatment course with pt.  10:00AM EDP ordered medication: 0.9% NaCl infusion, albuterol 0.5%, solu-medrol, Atrovent 0.02% nebulizer  12:43PM EDP rechecks pt  and discusses pt lab results with pt.   Results for orders placed during the hospital encounter of 10/12/11  CBC WITH DIFFERENTIAL      Component Value Range   WBC 9.2  4.0 - 10.5 K/uL   RBC 4.18 (*) 4.22 - 5.81 MIL/uL   Hemoglobin 12.2 (*) 13.0 - 17.0 g/dL   HCT 96.0 (*) 45.4 - 09.8 %   MCV 92.1  78.0 - 100.0 fL   MCH 29.2  26.0 - 34.0 pg   MCHC 31.7  30.0 - 36.0 g/dL   RDW 11.9  14.7 - 82.9 %   Platelets 164  150 - 400 K/uL   Neutrophils Relative 78 (*) 43 - 77 %   Neutro Abs 7.2  1.7 - 7.7 K/uL   Lymphocytes Relative 13  12 - 46 %   Lymphs Abs 1.2  0.7 - 4.0 K/uL   Monocytes Relative 8  3 - 12 %   Monocytes Absolute 0.8  0.1 - 1.0 K/uL   Eosinophils Relative 1  0 - 5 %   Eosinophils Absolute 0.1  0.0 - 0.7 K/uL   Basophils Relative 0  0 - 1 %   Basophils Absolute 0.0  0.0 - 0.1 K/uL  COMPREHENSIVE METABOLIC PANEL      Component Value Range   Sodium 133 (*) 135 - 145 mEq/L   Potassium 3.7  3.5 - 5.1 mEq/L   Chloride 90 (*) 96 - 112 mEq/L   CO2 33 (*) 19 - 32 mEq/L   Glucose, Bld 124 (*) 70 - 99 mg/dL   BUN 19  6 - 23 mg/dL   Creatinine, Ser 5.62  0.50 - 1.35 mg/dL   Calcium 13.0 (*) 8.4 - 10.5 mg/dL   Total Protein 8.6 (*) 6.0 - 8.3 g/dL   Albumin 3.9  3.5 - 5.2 g/dL   AST 52 (*) 0 - 37 U/L   ALT 27  0 - 53 U/L   Alkaline Phosphatase 63  39 - 117 U/L   Total Bilirubin 0.6  0.3 - 1.2 mg/dL   GFR calc non Af Amer 54 (*) >90 mL/min   GFR calc Af Amer 63 (*) >90 mL/min  URINALYSIS, ROUTINE W REFLEX MICROSCOPIC      Component Value Range   Color, Urine YELLOW  YELLOW   APPearance CLEAR  CLEAR   Specific Gravity, Urine >1.030 (*) 1.005 - 1.030   pH 6.0  5.0 - 8.0   Glucose, UA NEGATIVE  NEGATIVE mg/dL   Hgb urine dipstick LARGE (*) NEGATIVE   Bilirubin Urine NEGATIVE  NEGATIVE   Ketones, ur NEGATIVE  NEGATIVE mg/dL   Protein, ur 865 (*) NEGATIVE mg/dL   Urobilinogen, UA 0.2  0.0 - 1.0 mg/dL   Nitrite NEGATIVE  NEGATIVE   Leukocytes, UA NEGATIVE  NEGATIVE  PRO B  NATRIURETIC PEPTIDE      Component Value Range   Pro B Natriuretic peptide (BNP) 408.7  0 - 450 pg/mL  D-DIMER, QUANTITATIVE      Component Value Range  D-Dimer, Quant 0.87 (*) 0.00 - 0.48 ug/mL-FEU  LACTIC ACID, PLASMA      Component Value Range   Lactic Acid, Venous 1.5  0.5 - 2.2 mmol/L  CULTURE, BLOOD (ROUTINE X 2)      Component Value Range   Specimen Description BLOOD LEFT WRIST     Special Requests BOTTLES DRAWN AEROBIC AND ANAEROBIC 8CC     Culture PENDING     Report Status PENDING    CULTURE, BLOOD (ROUTINE X 2)      Component Value Range   Specimen Description BLOOD RIGHT HAND     Special Requests BOTTLES DRAWN AEROBIC AND ANAEROBIC 8CC     Culture PENDING     Report Status PENDING    TROPONIN I      Component Value Range   Troponin I <0.30  <0.30 ng/mL  URINE MICROSCOPIC-ADD ON      Component Value Range   Squamous Epithelial / LPF RARE  RARE   RBC / HPF 0-2  <3 RBC/hpf   Dg Chest Port 1 View  10/12/2011  *RADIOLOGY REPORT*  Clinical Data: 1-week history of shortness of breath, cough, and fever.  History of oxygen dependent COPD.  PORTABLE CHEST - 1 VIEW 10/12/2011 0949 hours:  Comparison: None.  Findings: Emphysematous changes in the upper lobes.  Suboptimal inspiration which accounts for crowded bronchovascular markings in the lung bases.  Cardiac silhouette mildly enlarged for technique and degree of inspiration.  Lungs clear.  No visible pleural effusions.  IMPRESSION: Suboptimal inspiration.  COPD/emphysema.  No acute cardiopulmonary disease.  Original Report Authenticated By: Arnell Sieving, M.D.     10:50 AM Chest x-ray shows COPD.  D-dimer elevated at 0.87.  Waiting for chemistries to come back.  11:20 AM Results for orders placed during the hospital encounter of 10/12/11  CBC WITH DIFFERENTIAL      Component Value Range   WBC 9.2  4.0 - 10.5 K/uL   RBC 4.18 (*) 4.22 - 5.81 MIL/uL   Hemoglobin 12.2 (*) 13.0 - 17.0 g/dL   HCT 16.1 (*) 09.6 - 04.5 %    MCV 92.1  78.0 - 100.0 fL   MCH 29.2  26.0 - 34.0 pg   MCHC 31.7  30.0 - 36.0 g/dL   RDW 40.9  81.1 - 91.4 %   Platelets 164  150 - 400 K/uL   Neutrophils Relative 78 (*) 43 - 77 %   Neutro Abs 7.2  1.7 - 7.7 K/uL   Lymphocytes Relative 13  12 - 46 %   Lymphs Abs 1.2  0.7 - 4.0 K/uL   Monocytes Relative 8  3 - 12 %   Monocytes Absolute 0.8  0.1 - 1.0 K/uL   Eosinophils Relative 1  0 - 5 %   Eosinophils Absolute 0.1  0.0 - 0.7 K/uL   Basophils Relative 0  0 - 1 %   Basophils Absolute 0.0  0.0 - 0.1 K/uL  COMPREHENSIVE METABOLIC PANEL      Component Value Range   Sodium 133 (*) 135 - 145 mEq/L   Potassium 3.7  3.5 - 5.1 mEq/L   Chloride 90 (*) 96 - 112 mEq/L   CO2 33 (*) 19 - 32 mEq/L   Glucose, Bld 124 (*) 70 - 99 mg/dL   BUN 19  6 - 23 mg/dL   Creatinine, Ser 7.82  0.50 - 1.35 mg/dL   Calcium 95.6 (*) 8.4 - 10.5 mg/dL   Total Protein 8.6 (*) 6.0 -  8.3 g/dL   Albumin 3.9  3.5 - 5.2 g/dL   AST 52 (*) 0 - 37 U/L   ALT 27  0 - 53 U/L   Alkaline Phosphatase 63  39 - 117 U/L   Total Bilirubin 0.6  0.3 - 1.2 mg/dL   GFR calc non Af Amer 54 (*) >90 mL/min   GFR calc Af Amer 63 (*) >90 mL/min  URINALYSIS, ROUTINE W REFLEX MICROSCOPIC      Component Value Range   Color, Urine YELLOW  YELLOW   APPearance CLEAR  CLEAR   Specific Gravity, Urine >1.030 (*) 1.005 - 1.030   pH 6.0  5.0 - 8.0   Glucose, UA NEGATIVE  NEGATIVE mg/dL   Hgb urine dipstick LARGE (*) NEGATIVE   Bilirubin Urine NEGATIVE  NEGATIVE   Ketones, ur NEGATIVE  NEGATIVE mg/dL   Protein, ur 086 (*) NEGATIVE mg/dL   Urobilinogen, UA 0.2  0.0 - 1.0 mg/dL   Nitrite NEGATIVE  NEGATIVE   Leukocytes, UA NEGATIVE  NEGATIVE  PRO B NATRIURETIC PEPTIDE      Component Value Range   Pro B Natriuretic peptide (BNP) 408.7  0 - 450 pg/mL  D-DIMER, QUANTITATIVE      Component Value Range   D-Dimer, Quant 0.87 (*) 0.00 - 0.48 ug/mL-FEU  LACTIC ACID, PLASMA      Component Value Range   Lactic Acid, Venous 1.5  0.5 - 2.2 mmol/L    CULTURE, BLOOD (ROUTINE X 2)      Component Value Range   Specimen Description BLOOD LEFT WRIST     Special Requests BOTTLES DRAWN AEROBIC AND ANAEROBIC 8CC     Culture PENDING     Report Status PENDING    CULTURE, BLOOD (ROUTINE X 2)      Component Value Range   Specimen Description BLOOD RIGHT HAND     Special Requests BOTTLES DRAWN AEROBIC AND ANAEROBIC 8CC     Culture PENDING     Report Status PENDING    TROPONIN I      Component Value Range   Troponin I <0.30  <0.30 ng/mL  URINE MICROSCOPIC-ADD ON      Component Value Range   Squamous Epithelial / LPF RARE  RARE   RBC / HPF 0-2  <3 RBC/hpf   Dg Chest Port 1 View  10/12/2011  *RADIOLOGY REPORT*  Clinical Data: 1-week history of shortness of breath, cough, and fever.  History of oxygen dependent COPD.  PORTABLE CHEST - 1 VIEW 10/12/2011 0949 hours:  Comparison: None.  Findings: Emphysematous changes in the upper lobes.  Suboptimal inspiration which accounts for crowded bronchovascular markings in the lung bases.  Cardiac silhouette mildly enlarged for technique and degree of inspiration.  Lungs clear.  No visible pleural effusions.  IMPRESSION: Suboptimal inspiration.  COPD/emphysema.  No acute cardiopulmonary disease.  Original Report Authenticated By: Arnell Sieving, M.D.    Lab workup showed elevated D-dimer, chest x-ray showed COPD, will get CT angio of chest to check for PE.    1:02 PM CT angio of chest was negative for PE. Case discussed with Elliot Cousin, M.D., admit to telemetry to [facility] 1 for COPD exacerbation.  2:46 PM  Date: 10/12/2011  Rate: 119  Rhythm: sinus tachycardia  QRS Axis: right  Intervals: normal  ST/T Wave abnormalities: Inverted T waves in inferior leads.  Conduction Disutrbances:none  Narrative Interpretation: Abnormal EKG  Old EKG Reviewed: none available    1. COPD exacerbation  I personally performed the services described in this documentation, which was  scribed in my presence. The recorded information has been reviewed and considered.  Harold Johnson, M.D.      Carleene Cooper III, MD 10/12/11 1306     Carleene Cooper III, MD 10/12/11 [phone]

## 2011-10-12 NOTE — ED Notes (Signed)
Breathing tx finished. Pt ready for ct chest

## 2011-10-12 NOTE — ED Notes (Signed)
RT in to obtain ABG 

## 2011-10-13 ENCOUNTER — Encounter (HOSPITAL_COMMUNITY): Payer: Self-pay | Admitting: Internal Medicine

## 2011-10-13 DIAGNOSIS — R748 Abnormal levels of other serum enzymes: Secondary | ICD-10-CM

## 2011-10-13 DIAGNOSIS — D649 Anemia, unspecified: Secondary | ICD-10-CM

## 2011-10-13 DIAGNOSIS — M6282 Rhabdomyolysis: Secondary | ICD-10-CM | POA: Diagnosis present

## 2011-10-13 DIAGNOSIS — T50905A Adverse effect of unspecified drugs, medicaments and biological substances, initial encounter: Secondary | ICD-10-CM | POA: Diagnosis not present

## 2011-10-13 HISTORY — DX: Abnormal levels of other serum enzymes: R74.8

## 2011-10-13 LAB — BASIC METABOLIC PANEL
CO2: 35 mEq/L — ABNORMAL HIGH (ref 19–32)
Calcium: 10.5 mg/dL (ref 8.4–10.5)
Chloride: 99 mEq/L (ref 96–112)
Creatinine, Ser: 1.32 mg/dL (ref 0.50–1.35)
GFR calc Af Amer: 59 mL/min — ABNORMAL LOW (ref >90)
Sodium: 141 mEq/L (ref 135–145)

## 2011-10-13 LAB — CBC
MCV: 92.9 fL (ref 78.0–100.0)
Platelets: 182 10*3/uL (ref 150–400)
RBC: 4.08 MIL/uL — ABNORMAL LOW (ref 4.22–5.81)
RDW: 13.8 % (ref 11.5–15.5)
WBC: 10.2 10*3/uL (ref 4.0–10.5)

## 2011-10-13 LAB — CARDIAC PANEL(CRET KIN+CKTOT+MB+TROPI): Relative Index: 2.4 (ref 0.0–2.5)

## 2011-10-13 LAB — IRON AND TIBC
Iron: 26 ug/dL — ABNORMAL LOW (ref 42–135)
Saturation Ratios: 11 % — ABNORMAL LOW (ref 20–55)
TIBC: 235 ug/dL (ref 215–435)

## 2011-10-13 LAB — GLUCOSE, CAPILLARY
Glucose-Capillary: 143 mg/dL — ABNORMAL HIGH (ref 70–99)
Glucose-Capillary: 237 mg/dL — ABNORMAL HIGH (ref 70–99)

## 2011-10-13 MED ORDER — INSULIN ASPART 100 UNIT/ML ~~LOC~~ SOLN
0.0000 [IU] | Freq: Three times a day (TID) | SUBCUTANEOUS | Status: DC
  Administered 2011-10-13: 3 [IU] via SUBCUTANEOUS
  Administered 2011-10-13: 7 [IU] via SUBCUTANEOUS
  Administered 2011-10-14 – 2011-10-15 (×3): 3 [IU] via SUBCUTANEOUS
  Administered 2011-10-15: 4 [IU] via SUBCUTANEOUS
  Administered 2011-10-16 (×2): 3 [IU] via SUBCUTANEOUS

## 2011-10-13 MED ORDER — ALPRAZOLAM 0.5 MG PO TABS
0.5000 mg | ORAL_TABLET | Freq: Once | ORAL | Status: AC
  Administered 2011-10-13: 0.5 mg via ORAL
  Filled 2011-10-13: qty 1

## 2011-10-13 MED ORDER — INSULIN GLARGINE 100 UNIT/ML ~~LOC~~ SOLN
10.0000 [IU] | Freq: Every day | SUBCUTANEOUS | Status: DC
  Administered 2011-10-13 – 2011-10-16 (×4): 10 [IU] via SUBCUTANEOUS

## 2011-10-13 MED ORDER — INSULIN ASPART 100 UNIT/ML ~~LOC~~ SOLN: 0.0000 [IU] | Freq: Every day | SUBCUTANEOUS | Status: DC

## 2011-10-13 NOTE — Progress Notes (Signed)
Pt. transferred to 300 dept.  

## 2011-10-13 NOTE — Progress Notes (Signed)
Subjective: The patient says that he is breathing a little bit better. He has less chest congestion. He has no chest wall pain currently. He denies musculoskeletal pain or tenderness except for his chronic low back pain. He denies any recent falls.    Objective: Vital signs in last 24 hours: Filed Vitals:   10/13/11 0600 10/13/11 0700 10/13/11 0800 10/13/11 0834  BP:      Pulse: 78 85    Temp:   97.6 F (36.4 C)   TempSrc:   Oral   Resp: 12 21    Height:      Weight:      SpO2: 98% 99%  95%    Intake/Output Summary (Last 24 hours) at 10/13/11 0914 Last data filed at 10/13/11 0700  Gross per 24 hour  Intake   2195 ml  Output   1800 ml  Net    395 ml    Weight change:   Physical exam: General: Pleasant 77 year old after -- American man sitting up in bed, in no acute distress. Lungs: Faint diffuse wheezing bilaterally, decreased rhonchus wheezing compared to yesterday. Heart: S1, S2, with a soft systolic murmur. Abdomen: Positive bowel sounds, soft, nontender, nondistended. Extremities: No pedal edema. Musculoskeletal: No tenderness of his pectoralis major or minor. No tenderness over his proximal and distal legs. No acute hot red joints.  Lab Results: Basic Metabolic Panel:  Basename 10/13/11 0426 10/12/11 0958  NA 141 133*  K 4.1 3.7  CL 99 90*  CO2 35* 33*  GLUCOSE 152* 124*  BUN 23 19  CREATININE 1.32 1.26  CALCIUM 10.5 10.6*  MG -- --  PHOS -- --   Liver Function Tests:  [facility] 10/12/11 0958  AST 52*  ALT 27  ALKPHOS 63  BILITOT 0.6  PROT 8.6*  ALBUMIN 3.9   No results found for this basename: LIPASE:2,AMYLASE:2 in the last 72 hours No results found for this basename: AMMONIA:2 in the last 72 hours CBC:  Basename 10/13/11 0426 10/12/11 0958  WBC 10.2 9.2  NEUTROABS -- 7.2  HGB 11.9* 12.2*  HCT 37.9* 38.5*  MCV 92.9 92.1  PLT 182 164   Cardiac Enzymes:  Basename 10/13/11 0426 10/12/11 1526 10/12/11 1000  CKTOTAL 3040* 1925* --  CKMB  73.0* 30.6* --  CKMBINDEX -- -- --  TROPONINI <0.30 <0.30 <0.30   BNP:  Basename 10/12/11 0958  PROBNP 408.7   D-Dimer:  Alvira Philips 10/12/11 0958  DDIMER 0.87*   CBG: No results found for this basename: GLUCAP:6 in the last 72 hours Hemoglobin A1C: No results found for this basename: HGBA1C in the last 72 hours Fasting Lipid Panel: No results found for this basename: CHOL,HDL,LDLCALC,TRIG,CHOLHDL,LDLDIRECT in the last 72 hours Thyroid Function Tests:  Basename 10/12/11 0958  TSH 0.543  T4TOTAL --  FREET4 1.20  T3FREE --  THYROIDAB --   Anemia Panel: No results found for this basename: VITAMINB12,FOLATE,FERRITIN,TIBC,IRON,RETICCTPCT in the last 72 hours Coagulation: No results found for this basename: LABPROT:2,INR:2 in the last 72 hours Urine Drug Screen: Drugs of Abuse  No results found for this basename: labopia, cocainscrnur, labbenz, amphetmu, thcu, labbarb    Alcohol Level: No results found for this basename: ETH:2 in the last 72 hours Urinalysis:  Basename 10/12/11 1019  COLORURINE YELLOW  LABSPEC >1.030*  PHURINE 6.0  GLUCOSEU NEGATIVE  HGBUR LARGE*  BILIRUBINUR NEGATIVE  KETONESUR NEGATIVE  PROTEINUR 100*  UROBILINOGEN 0.2  NITRITE NEGATIVE  LEUKOCYTESUR NEGATIVE   Misc. Labs:   Micro: Recent Results (from  the past 240 hour(s))  CULTURE, BLOOD (ROUTINE X 2)     Status: Normal (Preliminary result)   Collection Time   10/12/11  9:59 AM      Component Value Range Status Comment   Specimen Description BLOOD LEFT WRIST   Final    Special Requests BOTTLES DRAWN AEROBIC AND ANAEROBIC 8CC   Final    Culture NO GROWTH <24 HRS   Final    Report Status PENDING   Incomplete   CULTURE, BLOOD (ROUTINE X 2)     Status: Normal (Preliminary result)   Collection Time   10/12/11 10:03 AM      Component Value Range Status Comment   Specimen Description BLOOD RIGHT HAND   Final    Special Requests BOTTLES DRAWN AEROBIC AND ANAEROBIC 8CC   Final    Culture NO  GROWTH <24 HRS   Final    Report Status PENDING   Incomplete   MRSA PCR SCREENING     Status: Normal   Collection Time   10/12/11  3:21 PM      Component Value Range Status Comment   MRSA by PCR NEGATIVE  NEGATIVE Final     Studies/Results: Ct Angio Chest W/cm &/or Wo Cm  10/12/2011  *RADIOLOGY REPORT*  Clinical Data: Shortness of breath.  Elevated D-dimer.  CT ANGIOGRAPHY CHEST  Technique:  Multidetector CT imaging of the chest using the standard protocol during bolus administration of intravenous contrast. Multiplanar reconstructed images including MIPs were obtained and reviewed to evaluate the vascular anatomy.  Contrast: OMNIPAQUE IOHEXOL 350 MG/ML SOLN  Comparison: Portable chest obtained earlier today.  Findings: Normally opacified pulmonary arteries with no pulmonary arterial filling defects.  Airspace opacity in the inferior, posterior aspect of the right lower lobe.  The remainder of the lungs are clear.  Minimally prominent right inferior hilar lymph nodes.  No lung masses.  Two small areas of low density in the liver, not included in their entirety.  No definite associated enhancement visualized.  IMPRESSION:  1.  No pulmonary emboli. 2.  Small amount of patchy opacity at the right lung base.  This could represent pneumonia or patchy atelectasis. 3.  Minimally prominent right inferior hilar lymph nodes, most likely reactive. 4.  Two small probable cysts in the liver.  Original Report Authenticated By: Darrol Angel, M.D.   Dg Chest Port 1 View  10/12/2011  *RADIOLOGY REPORT*  Clinical Data: 1-week history of shortness of breath, cough, and fever.  History of oxygen dependent COPD.  PORTABLE CHEST - 1 VIEW 10/12/2011 0949 hours:  Comparison: None.  Findings: Emphysematous changes in the upper lobes.  Suboptimal inspiration which accounts for crowded bronchovascular markings in the lung bases.  Cardiac silhouette mildly enlarged for technique and degree of inspiration.  Lungs clear.  No  visible pleural effusions.  IMPRESSION: Suboptimal inspiration.  COPD/emphysema.  No acute cardiopulmonary disease.  Original Report Authenticated By: Arnell Sieving, M.D.    Medications:  Scheduled:   . albuterol      . albuterol  10 mg/hr Nebulization Once  . amLODipine  5 mg Oral Daily  . aspirin EC  81 mg Oral Daily  . azithromycin  500 mg Intravenous Q24H  . benzonatate  100 mg Oral TID  . cefTRIAXone (ROCEPHIN)  IV  1 g Intravenous Q24H  . cloNIDine  0.1 mg Oral QHS  . docusate sodium  100 mg Oral BID  . enoxaparin (LOVENOX) injection  40 mg Subcutaneous Q24H  .  famotidine  20 mg Oral BID  . gabapentin  300 mg Oral TID  . guaiFENesin  600 mg Oral BID  . ipratropium      . ipratropium  0.5 mg Nebulization Q4H  . levalbuterol  0.63 mg Nebulization Q4H  . losartan  100 mg Oral Daily  . magnesium sulfate 1 - 4 g bolus IVPB  2 g Intravenous Once  . methylPREDNISolone (SOLU-MEDROL) injection  80 mg Intravenous Q6H  . sodium chloride      . DISCONTD: magnesium sulfate LVP 250-500 ml  2 g Intravenous Once  . DISCONTD: methylPREDNISolone (SOLU-MEDROL) injection  125 mg Intravenous Once  . DISCONTD: simvastatin  20 mg Oral QHS   Continuous:   . 0.9 % NaCl with KCl 20 mEq / L 75 mL/hr at 10/13/11 0737  . DISCONTD: sodium chloride 125 mL/hr at 10/12/11 1012   ZOX:[id number], acetaminophen, alum & mag hydroxide-simeth, iohexol, levalbuterol, nabumetone, ondansetron (ZOFRAN) IV, ondansetron, oxyCODONE  Assessment: Principal Problem:  *COPD with acute exacerbation Active Problems:  Chronic respiratory failure with hypoxia  Acute respiratory failure with hypercapnia  PNA (pneumonia)  Tachycardia  Right Axis Deviation  Hyponatremia  Anemia  Chronic back pain  Chest wall pain  Hypertension  Elevated CK    1. COPD with exacerbation and concomitant community-acquired pneumonia. We'll continue Rocephin, azithromycin, IV steroids, nebulizers, and oxygen. He is also  status post IV magnesium sulfate. We'll also continue Mucinex and Tessalon Perles. He is improved slightly.   Acute on chronic hypoxic respiratory failure with mild concomitant hypercapnia/respiratory acidosis.  Elevated total CK and CK-MB. His troponin I is negative. Etiology of this elevation is unclear. He has had no recent falls. This could be related to statin therapy.  Hypertension. Currently stable.  Sinus tachycardia. Resolved with IV fluid hydration.  Hyponatremia secondary to hypovolemia. Resolved with IV fluids.  Normocytic anemia. His free T4 and TSH are within normal limits. Anemia panel is pending.  Steroid-induced hyperglycemia. We'll start sliding scale NovoLog and Lantus.  Plan: 1. We'll discontinue statin. 2. Continue gentle IV fluid hydration. 3. Continue monitoring the patient in the step down setting. 4. We'll start sliding scale NovoLog and Lantus.   LOS: 1 day   Maleeha Halls 10/13/2011, 9:14 AM

## 2011-10-13 NOTE — Progress Notes (Signed)
md notified of ckmb of 33

## 2011-10-14 ENCOUNTER — Encounter (HOSPITAL_COMMUNITY): Payer: Self-pay | Admitting: Anesthesiology

## 2011-10-14 ENCOUNTER — Inpatient Hospital Stay (HOSPITAL_COMMUNITY): Payer: Medicare PPO

## 2011-10-14 DIAGNOSIS — E875 Hyperkalemia: Secondary | ICD-10-CM | POA: Diagnosis present

## 2011-10-14 DIAGNOSIS — G9340 Encephalopathy, unspecified: Secondary | ICD-10-CM

## 2011-10-14 LAB — BLOOD GAS, ARTERIAL
Acid-Base Excess: 6.1 mmol/L — ABNORMAL HIGH (ref 0.0–2.0)
Bicarbonate: 33.8 mEq/L — ABNORMAL HIGH (ref 20.0–24.0)
Bicarbonate: 31.6 mEq/L — ABNORMAL HIGH (ref 20.0–24.0)
Drawn by: 23534
FIO2: 40 %
O2 Content: 2.5 L/min
Patient temperature: 37
TCO2: 22.4 mmol/L (ref 0–100)
pCO2 arterial: 98.4 mmHg (ref 35.0–45.0)
pH, Arterial: 7.339 — ABNORMAL LOW (ref 7.350–7.450)
pO2, Arterial: 69.7 mmHg — ABNORMAL LOW (ref 80.0–100.0)

## 2011-10-14 LAB — URINE CULTURE: Colony Count: NO GROWTH

## 2011-10-14 LAB — BASIC METABOLIC PANEL
Calcium: 10.2 mg/dL (ref 8.4–10.5)
Creatinine, Ser: 1.16 mg/dL (ref 0.50–1.35)
GFR calc Af Amer: 69 mL/min — ABNORMAL LOW (ref >90)
GFR calc non Af Amer: 60 mL/min — ABNORMAL LOW (ref >90)
Sodium: 141 mEq/L (ref 135–145)

## 2011-10-14 LAB — CK TOTAL AND CKMB (NOT AT ARMC)
CK, MB: 133.6 ng/mL (ref 0.3–4.0)
Relative Index: 2.6 — ABNORMAL HIGH (ref 0.0–2.5)
Total CK: 5238 U/L — ABNORMAL HIGH (ref 7–232)

## 2011-10-14 LAB — GLUCOSE, CAPILLARY: Glucose-Capillary: 116 mg/dL — ABNORMAL HIGH (ref 70–99)

## 2011-10-14 LAB — TROPONIN I: Troponin I: <0.3 ng/mL (ref <0.30)

## 2011-10-14 LAB — PRO B NATRIURETIC PEPTIDE: Pro B Natriuretic peptide (BNP): 321.9 pg/mL (ref 0–450)

## 2011-10-14 MED ORDER — PROPOFOL 10 MG/ML IV EMUL
5.0000 ug/kg/min | INTRAVENOUS | Status: DC
  Administered 2011-10-14 (×2): 35 ug/kg/min via INTRAVENOUS
  Administered 2011-10-15: 45 ug/kg/min via INTRAVENOUS
  Administered 2011-10-15: 30 ug/kg/min via INTRAVENOUS
  Administered 2011-10-15 (×3): 35 ug/kg/min via INTRAVENOUS
  Administered 2011-10-16: 9.6 ug/kg/min via INTRAVENOUS
  Administered 2011-10-16: 17.69 ug/kg/min via INTRAVENOUS
  Administered 2011-10-16: 35 ug/kg/min via INTRAVENOUS
  Administered 2011-10-17: 17.69 ug/kg/min via INTRAVENOUS
  Administered 2011-10-17: 15 ug/kg/min via INTRAVENOUS
  Administered 2011-10-17: 35 ug/kg/min via INTRAVENOUS
  Administered 2011-10-17: 30 ug/kg/min via INTRAVENOUS
  Administered 2011-10-18 (×2): 25 ug/kg/min via INTRAVENOUS
  Administered 2011-10-18: 35 ug/kg/min via INTRAVENOUS
  Administered 2011-10-18: 30 ug/kg/min via INTRAVENOUS
  Administered 2011-10-18: 15 ug/kg/min via INTRAVENOUS
  Administered 2011-10-18: 30 ug/kg/min via INTRAVENOUS
  Administered 2011-10-19: 35 ug/kg/min via INTRAVENOUS
  Administered 2011-10-19: 25 ug/kg/min via INTRAVENOUS
  Administered 2011-10-19: 35 ug/kg/min via INTRAVENOUS
  Administered 2011-10-19: 30 ug/kg/min via INTRAVENOUS
  Administered 2011-10-20: 20 ug/kg/min via INTRAVENOUS
  Administered 2011-10-20: 25 ug/kg/min via INTRAVENOUS
  Filled 2011-10-14 (×28): qty 100

## 2011-10-14 MED ORDER — FUROSEMIDE 10 MG/ML IJ SOLN
20.0000 mg | Freq: Once | INTRAMUSCULAR | Status: AC
  Administered 2011-10-14: 20 mg via INTRAVENOUS
  Filled 2011-10-14: qty 2

## 2011-10-14 MED ORDER — PANTOPRAZOLE SODIUM 40 MG IV SOLR
40.0000 mg | INTRAVENOUS | Status: DC
  Administered 2011-10-14 – 2011-10-15 (×2): 40 mg via INTRAVENOUS
  Filled 2011-10-14 (×2): qty 40

## 2011-10-14 MED ORDER — SUCCINYLCHOLINE CHLORIDE 20 MG/ML IJ SOLN
INTRAMUSCULAR | Status: AC
  Filled 2011-10-14: qty 1

## 2011-10-14 MED ORDER — ROCURONIUM BROMIDE 50 MG/5ML IV SOLN
INTRAVENOUS | Status: AC
  Filled 2011-10-14: qty 2

## 2011-10-14 MED ORDER — HYDRALAZINE HCL 20 MG/ML IJ SOLN
10.0000 mg | INTRAMUSCULAR | Status: DC | PRN
  Administered 2011-10-16 – 2011-10-20 (×4): 10 mg via INTRAVENOUS
  Filled 2011-10-14 (×4): qty 1

## 2011-10-14 MED ORDER — BIOTENE DRY MOUTH MT LIQD
15.0000 mL | Freq: Four times a day (QID) | OROMUCOSAL | Status: DC
  Administered 2011-10-14 – 2011-10-24 (×33): 15 mL via OROMUCOSAL

## 2011-10-14 MED ORDER — CHLORHEXIDINE GLUCONATE 0.12 % MT SOLN
15.0000 mL | Freq: Two times a day (BID) | OROMUCOSAL | Status: DC
  Administered 2011-10-14 – 2011-10-24 (×20): 15 mL via OROMUCOSAL
  Filled 2011-10-14 (×20): qty 15

## 2011-10-14 MED ORDER — ETOMIDATE 2 MG/ML IV SOLN
INTRAVENOUS | Status: AC
  Filled 2011-10-14: qty 20

## 2011-10-14 MED ORDER — SODIUM CHLORIDE 0.45 % IV SOLN
INTRAVENOUS | Status: DC
  Administered 2011-10-14: 75 mL/h via INTRAVENOUS
  Administered 2011-10-15: 04:00:00 via INTRAVENOUS

## 2011-10-14 MED ORDER — CLONIDINE HCL 0.2 MG/24HR TD PTWK: 0.2000 mg | MEDICATED_PATCH | TRANSDERMAL | Status: DC

## 2011-10-14 MED ORDER — LIDOCAINE HCL (CARDIAC) 20 MG/ML IV SOLN
INTRAVENOUS | Status: AC
  Filled 2011-10-14: qty 5

## 2011-10-14 MED ORDER — PROPOFOL 10 MG/ML IV EMUL
INTRAVENOUS | Status: AC
  Filled 2011-10-14: qty 100

## 2011-10-14 MED ORDER — SODIUM CHLORIDE 0.9 % IJ SOLN
INTRAMUSCULAR | Status: AC
  Filled 2011-10-14: qty 3

## 2011-10-14 NOTE — Anesthesia Preprocedure Evaluation (Addendum)
Anesthesia Evaluation  Patient identified by MRN, date of birth, ID band Patient awake    Reviewed: Allergy & Precautions, H&P , NPO status   Airway Mallampati: II TM Distance: >3 FB     Dental  (+) Partial Lower and Missing   Pulmonary COPD oxygen dependent, Current Smoker,    + decreased breath sounds+ wheezing      Cardiovascular Rhythm:regular Rate:Tachycardia     Neuro/Psych    GI/Hepatic   Endo/Other    Renal/GU      Musculoskeletal   Abdominal   Peds  Hematology   Anesthesia Other Findings Called to intubate Mr. Oleksy who was admitted with SOB and hypertension.  K+ 5.4.  BP 188/111, HR 119.  O2 sats 98% on nasal O2 with CO2 of 98.  Intubation ordered by Dr. Sherrie Mustache.  Reproductive/Obstetrics                           Anesthesia Physical Anesthesia Plan  ASA: III  Anesthesia Plan:    Post-op Pain Management:    Induction:   Airway Management Planned: Oral ETT  Additional Equipment:   Intra-op Plan:   Post-operative Plan:   Informed Consent:   Only emergency history available  Plan Discussed with:   Anesthesia Plan Comments:         Anesthesia Quick Evaluation

## 2011-10-14 NOTE — Progress Notes (Addendum)
Subjective:  RN reports that the patient has become confused. He is moving less air. He is wheezing more. When I entered the room, the patient was clearly confused, significantly different from yesterday. His eyes are open, but he does not provide good eye contact. When asked if he was in any distress or pain, he mumbles something that's not intelligible.    Objective: Vital signs in last 24 hours: Filed Vitals:   10/14/11 0512 10/14/11 0738 10/14/11 0857 10/14/11 1123  BP: 171/80  153/75   Pulse: 107  107   Temp: 97.8 F (36.6 C)  97.4 F (36.3 C)   TempSrc: Oral  Oral   Resp: 20  20   Height:      Weight:      SpO2: 99% 97% 95% 94%    Intake/Output Summary (Last 24 hours) at 10/14/11 1151 Last data filed at 10/14/11 0500  Gross per 24 hour  Intake   3891 ml  Output      0 ml  Net   3891 ml    Weight change:   Physical exam: Lungs: Diffuse bilateral wheezing bilaterally and decreased air movement. The kidney. Heart: S1, S2, with a soft systolic murmur. Abdomen: Positive bowel sounds, soft, nontender, nondistended. Extremities: No pedal edema. Neurologic: He is very confused. His eyes are open but he is lethargic. He does not follow commands.     Lab Results: Basic Metabolic Panel:  Basename 10/14/11 0657 10/13/11 0426  NA 141 141  K 5.4* 4.1  CL 102 99  CO2 34* 35*  GLUCOSE 141* 152*  BUN 28* 23  CREATININE 1.16 1.32  CALCIUM 10.2 10.5  MG -- --  PHOS -- --   Liver Function Tests:  [facility] 10/12/11 0958  AST 52*  ALT 27  ALKPHOS 63  BILITOT 0.6  PROT 8.6*  ALBUMIN 3.9   No results found for this basename: LIPASE:2,AMYLASE:2 in the last 72 hours No results found for this basename: AMMONIA:2 in the last 72 hours CBC:  Basename 10/13/11 0426 10/12/11 0958  WBC 10.2 9.2  NEUTROABS -- 7.2  HGB 11.9* 12.2*  HCT 37.9* 38.5*  MCV 92.9 92.1  PLT 182 164   Cardiac Enzymes:  Basename 10/14/11 0657 10/13/11 0426 10/12/11 1526 10/12/11 1000    CKTOTAL 5238* 3040* 1925* --  CKMB 133.6* 73.0* 30.6* --  CKMBINDEX -- -- -- --  TROPONINI -- <0.30 <0.30 <0.30   BNP:  Basename 10/12/11 0958  PROBNP 408.7   D-Dimer:  Alvira Philips 10/12/11 0958  DDIMER 0.87*   CBG:  Basename 10/14/11 1114 10/14/11 0754 10/13/11 2054 10/13/11 1810 10/13/11 1146  GLUCAP 127* 131* 125* 237* 143*   Hemoglobin A1C: No results found for this basename: HGBA1C in the last 72 hours Fasting Lipid Panel: No results found for this basename: CHOL,HDL,LDLCALC,TRIG,CHOLHDL,LDLDIRECT in the last 72 hours Thyroid Function Tests:  Basename 10/12/11 0958  TSH 0.543  T4TOTAL --  FREET4 1.20  T3FREE --  THYROIDAB --   Anemia Panel:  Basename 10/13/11 0426  VITAMINB12 680  FOLATE --  FERRITIN 140  TIBC 235  IRON 26*  RETICCTPCT --   Coagulation: No results found for this basename: LABPROT:2,INR:2 in the last 72 hours Urine Drug Screen: Drugs of Abuse  No results found for this basename: labopia,  cocainscrnur,  labbenz,  amphetmu,  thcu,  labbarb    Alcohol Level: No results found for this basename: ETH:2 in the last 72 hours Urinalysis:  Basename 10/12/11 1019  COLORURINE  YELLOW  LABSPEC >1.030*  PHURINE 6.0  GLUCOSEU NEGATIVE  HGBUR LARGE*  BILIRUBINUR NEGATIVE  KETONESUR NEGATIVE  PROTEINUR 100*  UROBILINOGEN 0.2  NITRITE NEGATIVE  LEUKOCYTESUR NEGATIVE   Misc. Labs:   Micro: Recent Results (from the past 240 hour(s))  CULTURE, BLOOD (ROUTINE X 2)     Status: Normal (Preliminary result)   Collection Time   10/12/11  9:59 AM      Component Value Range Status Comment   Specimen Description BLOOD LEFT WRIST   Final    Special Requests BOTTLES DRAWN AEROBIC AND ANAEROBIC 8CC   Final    Culture NO GROWTH 1 DAY   Final    Report Status PENDING   Incomplete   CULTURE, BLOOD (ROUTINE X 2)     Status: Normal (Preliminary result)   Collection Time   10/12/11 10:03 AM      Component Value Range Status Comment   Specimen Description  BLOOD RIGHT HAND   Final    Special Requests BOTTLES DRAWN AEROBIC AND ANAEROBIC 8CC   Final    Culture NO GROWTH 1 DAY   Final    Report Status PENDING   Incomplete   URINE CULTURE     Status: Normal   Collection Time   10/12/11 10:19 AM      Component Value Range Status Comment   Specimen Description URINE, CLEAN CATCH   Final    Special Requests NONE   Final    Culture  Setup Time 10/12/2011 19:39   Final    Colony Count NO GROWTH   Final    Culture NO GROWTH   Final    Report Status 10/14/2011 FINAL   Final   MRSA PCR SCREENING     Status: Normal   Collection Time   10/12/11  3:21 PM      Component Value Range Status Comment   MRSA by PCR NEGATIVE  NEGATIVE Final     Studies/Results: Ct Angio Chest W/cm &/or Wo Cm  10/12/2011  *RADIOLOGY REPORT*  Clinical Data: Shortness of breath.  Elevated D-dimer.  CT ANGIOGRAPHY CHEST  Technique:  Multidetector CT imaging of the chest using the standard protocol during bolus administration of intravenous contrast. Multiplanar reconstructed images including MIPs were obtained and reviewed to evaluate the vascular anatomy.  Contrast: OMNIPAQUE IOHEXOL 350 MG/ML SOLN  Comparison: Portable chest obtained earlier today.  Findings: Normally opacified pulmonary arteries with no pulmonary arterial filling defects.  Airspace opacity in the inferior, posterior aspect of the right lower lobe.  The remainder of the lungs are clear.  Minimally prominent right inferior hilar lymph nodes.  No lung masses.  Two small areas of low density in the liver, not included in their entirety.  No definite associated enhancement visualized.  IMPRESSION:  1.  No pulmonary emboli. 2.  Small amount of patchy opacity at the right lung base.  This could represent pneumonia or patchy atelectasis. 3.  Minimally prominent right inferior hilar lymph nodes, most likely reactive. 4.  Two small probable cysts in the liver.  Original Report Authenticated By: Darrol Angel, M.D.     Medications:  Scheduled:    . albuterol  10 mg/hr Nebulization Once  . ALPRAZolam  0.5 mg Oral Once  . amLODipine  5 mg Oral Daily  . aspirin EC  81 mg Oral Daily  . azithromycin  500 mg Intravenous Q24H  . benzonatate  100 mg Oral TID  . cefTRIAXone (ROCEPHIN)  IV  1 g  Intravenous Q24H  . cloNIDine  0.1 mg Oral QHS  . docusate sodium  100 mg Oral BID  . enoxaparin (LOVENOX) injection  40 mg Subcutaneous Q24H  . famotidine  20 mg Oral BID  . furosemide  20 mg Intravenous Once  . gabapentin  300 mg Oral TID  . guaiFENesin  600 mg Oral BID  . insulin aspart  0-20 Units Subcutaneous TID WC  . insulin aspart  0-5 Units Subcutaneous QHS  . insulin glargine  10 Units Subcutaneous QHS  . ipratropium  0.5 mg Nebulization Q4H  . levalbuterol  0.63 mg Nebulization Q4H  . losartan  100 mg Oral Daily  . methylPREDNISolone (SOLU-MEDROL) injection  80 mg Intravenous Q6H   Continuous:    . sodium chloride    . DISCONTD: 0.9 % NaCl with KCl 20 mEq / L 75 mL/hr at 10/13/11 2245   ZOX:[id number], acetaminophen, alum & mag hydroxide-simeth, levalbuterol, nabumetone, ondansetron (ZOFRAN) IV, ondansetron, oxyCODONE  Assessment: Principal Problem:  *COPD with acute exacerbation Active Problems:  Chronic respiratory failure with hypoxia  Acute respiratory failure with hypercapnia  PNA (pneumonia)  Tachycardia  Right Axis Deviation  Hyponatremia  Anemia  Chronic back pain  Chest wall pain  Hypertension  Elevated CK  Hyperglycemia, drug-induced  Encephalopathy acute  Hyperkalemia   The patient has become significantly worse. He is lethargic, encephalopathic, and moving little air. ABG ordered and just resulted. PH is 7.1, PCO2 98, PO2 70. Apparently, the patient had been getting high-dose oxygen infusions with the nebulizations which may have decreased his respiratory drive resulting in respiratory acidosis. Discussed CODE STATUS with his wife. He is a full code. Given his acute  and worsening respiratory failure and acute encephalopathy it would be best to intubate him. Anesthesiology has been notified. We'll consult Dr. Juanetta Gosling to assist with ventilator management. We'll discontinue potassium in the IV fluids. We'll give 1 dose of Lasix IV empirically. We'll order a 2-D echocardiogram and another EKG for evaluation of elevated CK and CK-MB. We'll check a proBNP is well.   Plan: Transfer the patient to the ICU. He will be intubated and mechanically ventilated. As above.    Critical care time: 60 minutes.    LOS: 2 days   Beya Tipps 10/14/2011, 11:51 AM

## 2011-10-14 NOTE — Consult Note (Signed)
Consult requested by: Dr. Sherrie Mustache Consult requested for respiratory failure:  HPI: This is a 77 year old who has severe COPD which requires oxygen at home. He was admitted to the hospital about 2 days ago and had done well initially and seemed to be improving. However today he developed marked worsening of his shortness of breath and was brought to the intensive care unit. Found a PCO2 in the 90s and respiratory distress and was intubated and placed on mechanical ventilation. He is not able to give any history but apparently he has not been intubated in the past  Past Medical History  Diagnosis Date  . COPD (chronic obstructive pulmonary disease)   . Hypertension   . Hypercholesteremia   . Stroke   . Chronic respiratory failure with hypoxia   . Chronic back pain   . Elevated CK 10/13/2011     History reviewed. No pertinent family history.   History   Social History  . Marital Status: Married    Spouse Name: N/A    Number of Children: N/A  . Years of Education: N/A   Social History Main Topics  . Smoking status: Former Games developer  . Smokeless tobacco: None  . Alcohol Use: No     used to  . Drug Use: No  . Sexually Active:    Other Topics Concern  . None   Social History Narrative  . None     ROS: Unobtainable    Objective: Vital signs in last 24 hours: Temp:  [97.4 F (36.3 C)-97.9 F (36.6 C)] 97.9 F (36.6 C) (07/09 1145) Pulse Rate:  [73-129] 76  (07/09 1830) Resp:  [0-22] 14  (07/09 1830) BP: (52-193)/(38-106) 107/60 mmHg (07/09 1830) SpO2:  [92 %-100 %] 100 % (07/09 1830) FiO2 (%):  [3 %-40.5 %] 39.9 % (07/09 1830) Weight change:  Last BM Date: 10/13/11  Intake/Output from previous day: 07/08 0701 - 07/09 0700 In: 4191 [P.O.:150; I.V.:3991; IV Piggyback:50] Out: 300 [Urine:300]  PHYSICAL EXAM Is intubated. He is on a ventilator. His O2 saturation is in the 100s. He looks comfortable. His pupils react. His nose and throat are clear. His mucous membranes  are minimally dry. His neck is supple without masses. His chest is clear with decreased breath sounds and end expiratory wheezes. His heart is regular. His abdomen is soft without masses. Bowel sounds present and active. Extremities showed no edema. Central nervous system examination is grossly intact.  Lab Results: Basic Metabolic Panel:  Basename 10/14/11 0657 10/13/11 0426  NA 141 141  K 5.4* 4.1  CL 102 99  CO2 34* 35*  GLUCOSE 141* 152*  BUN 28* 23  CREATININE 1.16 1.32  CALCIUM 10.2 10.5  MG -- --  PHOS -- --   Liver Function Tests:  [facility] 10/12/11 0958  AST 52*  ALT 27  ALKPHOS 63  BILITOT 0.6  PROT 8.6*  ALBUMIN 3.9   No results found for this basename: LIPASE:2,AMYLASE:2 in the last 72 hours No results found for this basename: AMMONIA:2 in the last 72 hours CBC:  Basename 10/13/11 0426 10/12/11 0958  WBC 10.2 9.2  NEUTROABS -- 7.2  HGB 11.9* 12.2*  HCT 37.9* 38.5*  MCV 92.9 92.1  PLT 182 164   Cardiac Enzymes:  Basename 10/14/11 0657 10/13/11 0426 10/12/11 1526  CKTOTAL 5238* 3040* 1925*  CKMB 133.6* 73.0* 30.6*  CKMBINDEX -- -- --  TROPONINI <0.30 <0.30 <0.30   BNP:  Basename 10/14/11 0657 10/12/11 0958  PROBNP 321.9 408.7  D-Dimer:  [facility] 10/12/11 0958  DDIMER 0.87*   CBG:  Basename 10/14/11 1611 10/14/11 1114 10/14/11 0754 10/13/11 2054 10/13/11 1810 10/13/11 1146  GLUCAP 99 127* 131* 125* 237* 143*   Hemoglobin A1C: No results found for this basename: HGBA1C in the last 72 hours Fasting Lipid Panel: No results found for this basename: CHOL,HDL,LDLCALC,TRIG,CHOLHDL,LDLDIRECT in the last 72 hours Thyroid Function Tests:  Basename 10/12/11 0958  TSH 0.543  T4TOTAL --  FREET4 1.20  T3FREE --  THYROIDAB --   Anemia Panel:  Basename 10/13/11 0426  VITAMINB12 680  FOLATE --  FERRITIN 140  TIBC 235  IRON 26*  RETICCTPCT --   Coagulation: No results found for this basename: LABPROT:2,INR:2 in the last 72 hours Urine  Drug Screen: Drugs of Abuse  No results found for this basename: labopia, cocainscrnur, labbenz, amphetmu, thcu, labbarb    Alcohol Level: No results found for this basename: ETH:2 in the last 72 hours Urinalysis:  Basename 10/12/11 1019  COLORURINE YELLOW  LABSPEC >1.030*  PHURINE 6.0  GLUCOSEU NEGATIVE  HGBUR LARGE*  BILIRUBINUR NEGATIVE  KETONESUR NEGATIVE  PROTEINUR 100*  UROBILINOGEN 0.2  NITRITE NEGATIVE  LEUKOCYTESUR NEGATIVE   Misc. Labs:   ABGS:  Basename 10/14/11 1500  PHART 7.339*  PO2ART 129.0*  TCO2 22.4  HCO3 31.6*     MICROBIOLOGY: Recent Results (from the past 240 hour(s))  CULTURE, BLOOD (ROUTINE X 2)     Status: Normal (Preliminary result)   Collection Time   10/12/11  9:59 AM      Component Value Range Status Comment   Specimen Description BLOOD LEFT WRIST   Final    Special Requests BOTTLES DRAWN AEROBIC AND ANAEROBIC 8CC   Final    Culture NO GROWTH 2 DAYS   Final    Report Status PENDING   Incomplete   CULTURE, BLOOD (ROUTINE X 2)     Status: Normal (Preliminary result)   Collection Time   10/12/11 10:03 AM      Component Value Range Status Comment   Specimen Description BLOOD RIGHT HAND   Final    Special Requests BOTTLES DRAWN AEROBIC AND ANAEROBIC 8CC   Final    Culture NO GROWTH 2 DAYS   Final    Report Status PENDING   Incomplete   URINE CULTURE     Status: Normal   Collection Time   10/12/11 10:19 AM      Component Value Range Status Comment   Specimen Description URINE, CLEAN CATCH   Final    Special Requests NONE   Final    Culture  Setup Time 10/12/2011 19:39   Final    Colony Count NO GROWTH   Final    Culture NO GROWTH   Final    Report Status 10/14/2011 FINAL   Final   MRSA PCR SCREENING     Status: Normal   Collection Time   10/12/11  3:21 PM      Component Value Range Status Comment   MRSA by PCR NEGATIVE  NEGATIVE Final     Studies/Results: Dg Chest Port 1 View  10/14/2011  *RADIOLOGY REPORT*  Clinical Data: Intubated.   PORTABLE CHEST - 1 VIEW  Comparison: 2 days ago.  Findings: Endotracheal tube tip 1.5 cm above the carina, directed toward the right mainstem bronchus.  Stable mildly enlarged cardiac silhouette.  Interval minimal patchy opacity at the right lung base.  Diffuse osteopenia.  IMPRESSION:  1.  The endotracheal tube is low in position.  It is recommended that this be retracted 4 cm. 2.  Minimal right basilar pneumonia or patchy atelectasis. 3.  Stable mild cardiomegaly.  Original Report Authenticated By: Darrol Angel, M.D.   Dg Chest Port 1v Same Day  10/14/2011  *RADIOLOGY REPORT*  Clinical Data: Endotracheal tube repositioning.  PORTABLE CHEST - 1 VIEW SAME DAY 10/14/2011 1437 hours:  Comparison: Portable chest x-ray earlier same date 1236 hours.  Findings: Endotracheal tube tip now in satisfactory position approximately 3 cm above the carina.  New mild atelectasis in the right upper lobe.  Minimal linear atelectasis at the right lung base.  Lungs otherwise clear.  Cardiac silhouette mildly enlarged but stable.  Pulmonary vascularity normal.  Nasogastric tube looped in the stomach with its tip in the proximal body.  IMPRESSION:  1.  Endotracheal tube tip now in satisfactory position approximately 3 cm above the carina. 2.  New mild right upper lobe atelectasis.  Stable minimal linear atelectasis at the right lung base. 3.  Stable cardiomegaly without pulmonary edema.  Original Report Authenticated By: Arnell Sieving, M.D.    Medications:  Scheduled:   . albuterol  10 mg/hr Nebulization Once  . ALPRAZolam  0.5 mg Oral Once  . antiseptic oral rinse  15 mL Mouth Rinse QID  . azithromycin  500 mg Intravenous Q24H  . cefTRIAXone (ROCEPHIN)  IV  1 g Intravenous Q24H  . chlorhexidine  15 mL Mouth Rinse BID  . docusate sodium  100 mg Oral BID  . enoxaparin (LOVENOX) injection  40 mg Subcutaneous Q24H  . etomidate      . furosemide  20 mg Intravenous Once  . insulin aspart  0-20 Units Subcutaneous TID  WC  . insulin aspart  0-5 Units Subcutaneous QHS  . insulin glargine  10 Units Subcutaneous QHS  . ipratropium  0.5 mg Nebulization Q4H  . levalbuterol  0.63 mg Nebulization Q4H  . lidocaine (cardiac) 100 mg/70ml      . methylPREDNISolone (SOLU-MEDROL) injection  80 mg Intravenous Q6H  . pantoprazole (PROTONIX) IV  40 mg Intravenous Q24H  . propofol      . rocuronium      . sodium chloride      . succinylcholine      . DISCONTD: amLODipine  5 mg Oral Daily  . DISCONTD: aspirin EC  81 mg Oral Daily  . DISCONTD: benzonatate  100 mg Oral TID  . DISCONTD: cloNIDine  0.2 mg Transdermal Weekly  . DISCONTD: cloNIDine  0.1 mg Oral QHS  . DISCONTD: famotidine  20 mg Oral BID  . DISCONTD: gabapentin  300 mg Oral TID  . DISCONTD: guaiFENesin  600 mg Oral BID  . DISCONTD: losartan  100 mg Oral Daily   Continuous:   . sodium chloride 75 mL/hr at 10/14/11 1800  . propofol 35 mcg/kg/min (10/14/11 1800)  . DISCONTD: 0.9 % NaCl with KCl 20 mEq / L 75 mL/hr at 10/13/11 2245   WUJ:[id number], acetaminophen, alum & mag hydroxide-simeth, hydrALAZINE, levalbuterol, nabumetone, ondansetron (ZOFRAN) IV, ondansetron, oxyCODONE  Assesment: He has COPD with acute exacerbation and acute on chronic respiratory failure. He is intubated and on the ventilator. I agree with treatment at this point. Principal Problem:  *COPD with acute exacerbation Active Problems:  Tachycardia  Right Axis Deviation  Hyponatremia  Anemia  Chronic respiratory failure with hypoxia  Acute respiratory failure with hypercapnia  Chronic back pain  PNA (pneumonia)  Chest wall pain  Hypertension  Elevated CK  Hyperglycemia, drug-induced  Encephalopathy acute  Hyperkalemia    Plan: Continue current medications and treatments. He will be reevaluated in the morning and see if he can wean    LOS: 2 days   Janeese Mcgloin L 10/14/2011, 6:50 PM

## 2011-10-14 NOTE — Progress Notes (Signed)
Pt brought from 300 due to respiratory distress. MD order to intubate. CRNA called and made aware. RT at bedside. MD at bedside.  At 1219 6cc of Amidate given and 3 cc of Rocuronium given per CRNA.  At 1222 first attempt to intubated. Number 8 tube placed, unsuccessful.  At 1223 2 cc of Amidate given per CRNA.  At 1225 second attempt to intubated successful. # 8 ET tube placed at 24 at lips.  At 1227 pt placed on vent  Pts spouse in waiting room and updated and aware of change in patient status. Once pt was stabilized spouse brought to bedside.

## 2011-10-14 NOTE — Progress Notes (Signed)
Pt was placed on the vent at 1226 due to respiratory stress ( COPD, Asthma). Mouth was cleaned, bed at 30degrees until his blood pressure dropped. Pt has some high ppeak and a lot of secretions. Secretions are tan.

## 2011-10-14 NOTE — Procedures (Signed)
Intubation Procedure Note Harold Johnson [id number] 09/19/1934  Procedure: Intubation Indications: Airway protection and maintenance  Procedure Details Consent: Risks of procedure as well as the alternatives and risks of each were explained to the (patient/caregiver).  Consent for procedure obtained. Time Out: Verified patient identification, verified procedure, site/side was marked, verified correct patient position, special equipment/implants available, medications/allergies/relevent history reviewed, required imaging and test results available.  Performed  Maximum sterile technique was used including gloves, gown and hand hygiene.  MAC and 3    Evaluation Hemodynamic Status: BP stable throughout; O2 sats: stable throughout Patient's Current Condition: stable Complications: No apparent complications Patient did tolerate procedure well. Chest X-ray ordered to verify placement.  CXR: pending.   Katheren Shams 10/14/2011

## 2011-10-14 NOTE — Care Management Note (Signed)
    Page 1 of 1   10/24/2011     11:10:21 AM   CARE MANAGEMENT NOTE 10/24/2011  Patient:  Harold Johnson, Harold Johnson   Account Number:  [id number]  Date Initiated:  10/14/2011  Documentation initiated by:  Rosemary Holms  Subjective/Objective Assessment:   Pt admitted from home where he lives at home with spouse. admitted  with COPD exacerbation and PNA. on 10/13/11 transfered from ICU to 300.     Action/Plan:   Today pt was transfered back to unit and is currently on vent.   Anticipated DC Date:  10/25/2011   Anticipated DC Plan:  SKILLED NURSING FACILITY  In-house referral  Clinical Social Worker      DC Planning Services  CM consult      Choice offered to / List presented to:             Status of service:  Completed, signed off Medicare Important Message given?   (If response is "NO", the following Medicare IM given date fields will be blank) Date Medicare IM given:   Date Additional Medicare IM given:    Discharge Disposition:  SKILLED NURSING FACILITY  Per UR Regulation:    If discussed at Long Length of Stay Meetings, dates discussed:   10/21/2011    Comments:  10/24/11 Rosemary Holms RN BSN CM SNF bed offered and accepted. Per CSW, going to [facility] in [place]. Will begin authorization process with [employer].  10/22/11 830 Elis Rawlinson RN BSN CM CM raised issue of LTAC with MD. PT to evaluate.  10/14/11 1100 Krue Peterka Leanord Hawking RN BSN CM

## 2011-10-14 NOTE — Anesthesia Postprocedure Evaluation (Signed)
  Anesthesia Post-op Note  Patient: Harold Johnson  Procedure(s) Performed: * No surgery found *  Patient Location: ICU  Anesthesia Type: MAC/Amidate, rocuronuium  Level of Consciousness: sedated  Airway and Oxygen Therapy: Patient remains intubated per anesthesia plan  Post-op Pain: none  Post-op Assessment: Post-op Vital signs reviewed  Post-op Vital Signs: stable  Complications: No apparent anesthesia complications   Patient intubated emergently in ICU.  Amidate 12 mg and Rocuronium 30 mg IV,  DL x 2 with cricoid pressure.  Mac3, grade 2 view.  #8 ETT placed, +ETCO2, decreased breath sounds bilaterally.  PCXR ordered to verify placement.

## 2011-10-14 NOTE — Progress Notes (Signed)
CRITICAL VALUE ALERT  Critical value received:  CK-MB   133.6  Date of notification:  10/14/2011  Time of notification:  0750  Critical value read back:yes  Nurse who received alert:  Billie Lade LPN MD notified (1st page):  Dr Sherrie Mustache  Time of first page:  [phone] MD notified (2nd page):Fisher,D  Time of second page:  Responding MD: Theador Hawthorne  Time MD responded:  [phone]

## 2011-10-15 ENCOUNTER — Encounter (HOSPITAL_COMMUNITY): Payer: Medicare Other

## 2011-10-15 ENCOUNTER — Inpatient Hospital Stay (HOSPITAL_COMMUNITY): Payer: Medicare PPO

## 2011-10-15 DIAGNOSIS — E872 Acidosis: Secondary | ICD-10-CM | POA: Diagnosis not present

## 2011-10-15 DIAGNOSIS — N179 Acute kidney failure, unspecified: Secondary | ICD-10-CM

## 2011-10-15 LAB — BLOOD GAS, ARTERIAL
Drawn by: 21310
MECHVT: 500 mL
RATE: 14 resp/min
pCO2 arterial: 51.7 mmHg — ABNORMAL HIGH (ref 35.0–45.0)
pH, Arterial: 7.435 (ref 7.350–7.450)

## 2011-10-15 LAB — GLUCOSE, CAPILLARY
Glucose-Capillary: 119 mg/dL — ABNORMAL HIGH (ref 70–99)
Glucose-Capillary: 168 mg/dL — ABNORMAL HIGH (ref 70–99)
Glucose-Capillary: 133 mg/dL — ABNORMAL HIGH (ref 70–99)

## 2011-10-15 LAB — COMPREHENSIVE METABOLIC PANEL
ALT: 43 U/L (ref 0–53)
AST: 59 U/L — ABNORMAL HIGH (ref 0–37)
CO2: 35 mEq/L — ABNORMAL HIGH (ref 19–32)
Calcium: 9.6 mg/dL (ref 8.4–10.5)
GFR calc non Af Amer: 40 mL/min — ABNORMAL LOW (ref >90)
Sodium: 141 mEq/L (ref 135–145)
Total Protein: 6.6 g/dL (ref 6.0–8.3)

## 2011-10-15 LAB — CBC
HCT: 33.7 % — ABNORMAL LOW (ref 39.0–52.0)
Hemoglobin: 10.5 g/dL — ABNORMAL LOW (ref 13.0–17.0)
MCH: 29.4 pg (ref 26.0–34.0)
MCHC: 31.2 g/dL (ref 30.0–36.0)

## 2011-10-15 LAB — CK TOTAL AND CKMB (NOT AT ARMC): Relative Index: 1.4 (ref 0.0–2.5)

## 2011-10-15 MED ORDER — SODIUM CHLORIDE 0.9 % IJ SOLN
10.0000 mL | Freq: Two times a day (BID) | INTRAMUSCULAR | Status: DC
  Administered 2011-10-15 – 2011-10-16 (×3): 10 mL
  Administered 2011-10-17 (×2): 20 mL
  Administered 2011-10-18 (×2): 10 mL
  Administered 2011-10-19: 20 mL
  Administered 2011-10-20 – 2011-10-21 (×4): 10 mL
  Administered 2011-10-22: 30 mL
  Administered 2011-10-22: 20 mL
  Administered 2011-10-23: 30 mL
  Administered 2011-10-23 – 2011-10-24 (×2): 10 mL
  Filled 2011-10-15: qty 6
  Filled 2011-10-15: qty 3
  Filled 2011-10-15: qty 9
  Filled 2011-10-15: qty 3
  Filled 2011-10-15: qty 6
  Filled 2011-10-15 (×2): qty 3
  Filled 2011-10-15: qty 9
  Filled 2011-10-15: qty 6
  Filled 2011-10-15: qty 3
  Filled 2011-10-15 (×3): qty 6
  Filled 2011-10-15: qty 3
  Filled 2011-10-15: qty 6

## 2011-10-15 MED ORDER — SODIUM CHLORIDE 0.9 % IJ SOLN
10.0000 mL | INTRAMUSCULAR | Status: DC | PRN
  Administered 2011-10-22 – 2011-10-24 (×2): 10 mL
  Filled 2011-10-15 (×3): qty 3

## 2011-10-15 MED ORDER — KCL IN DEXTROSE-NACL 20-5-0.45 MEQ/L-%-% IV SOLN
INTRAVENOUS | Status: DC
  Administered 2011-10-15: 100 mL/h via INTRAVENOUS
  Administered 2011-10-17: 1000 mL via INTRAVENOUS
  Administered 2011-10-19 – 2011-10-21 (×2): via INTRAVENOUS

## 2011-10-15 NOTE — Progress Notes (Signed)
Subjective: He remains intubated and on the ventilator. He is sedated. His blood gas is better but he still has an elevated PCO2 which I suspect is his baseline.  Objective: Vital signs in last 24 hours: Temp:  [97.4 F (36.3 C)-99.6 F (37.6 C)] 98.1 F (36.7 C) (07/10 0400) Pulse Rate:  [66-129] 67  (07/10 0630) Resp:  [0-22] 0  (07/10 0630) BP: (52-193)/(38-106) 116/74 mmHg (07/10 0630) SpO2:  [94 %-100 %] 100 % (07/10 0735) FiO2 (%):  [39.1 %-40.8 %] 40 % (07/10 0802) Weight:  [99.1 kg (218 lb 7.6 oz)] 99.1 kg (218 lb 7.6 oz) (07/10 0500) Weight change:  Last BM Date: 10/13/11  Intake/Output from previous day: 07/09 0701 - 07/10 0700 In: 1953.4 [I.V.:1333.4; NG/GT:70; IV Piggyback:550] Out: 850 [Urine:550; Emesis/NG output:300]  PHYSICAL EXAM General appearance: no distress and Intubated sedated and on the ventilator Resp: rhonchi bilaterally Cardio: regular rate and rhythm, S1, S2 normal, no murmur, click, rub or gallop GI: soft, non-tender; bowel sounds normal; no masses,  no organomegaly Extremities: extremities normal, atraumatic, no cyanosis or edema  Lab Results:    Basic Metabolic Panel:  Basename 10/15/11 0445 10/14/11 0657  NA 141 141  K 4.3 5.4*  CL 103 102  CO2 35* 34*  GLUCOSE 124* 141*  BUN 39* 28*  CREATININE 1.62* 1.16  CALCIUM 9.6 10.2  MG -- --  PHOS -- --   Liver Function Tests:  Basename 10/15/11 0445 10/12/11 0958  AST 59* 52*  ALT 43 27  ALKPHOS 54 63  BILITOT 0.3 0.6  PROT 6.6 8.6*  ALBUMIN 2.9* 3.9   No results found for this basename: LIPASE:2,AMYLASE:2 in the last 72 hours No results found for this basename: AMMONIA:2 in the last 72 hours CBC:  Basename 10/15/11 0445 10/13/11 0426 10/12/11 0958  WBC 8.8 10.2 --  NEUTROABS -- -- 7.2  HGB 10.5* 11.9* --  HCT 33.7* 37.9* --  MCV 94.4 92.9 --  PLT 178 182 --   Cardiac Enzymes:  Basename 10/15/11 0445 10/14/11 0657 10/13/11 0426 10/12/11 1526  CKTOTAL 1318* 5238* 3040*  --  CKMB 18.4* 133.6* 73.0* --  CKMBINDEX -- -- -- --  TROPONINI -- <0.30 <0.30 <0.30   BNP:  Basename 10/14/11 0657 10/12/11 0958  PROBNP 321.9 408.7   D-Dimer:  Alvira Philips 10/12/11 0958  DDIMER 0.87*   CBG:  Basename 10/15/11 0356 10/14/11 2349 10/14/11 1955 10/14/11 1611 10/14/11 1114 10/14/11 0754  GLUCAP 119* 128* 116* 99 127* 131*   Hemoglobin A1C: No results found for this basename: HGBA1C in the last 72 hours Fasting Lipid Panel: No results found for this basename: CHOL,HDL,LDLCALC,TRIG,CHOLHDL,LDLDIRECT in the last 72 hours Thyroid Function Tests:  Basename 10/12/11 0958  TSH 0.543  T4TOTAL --  FREET4 1.20  T3FREE --  THYROIDAB --   Anemia Panel:  Basename 10/13/11 0426  VITAMINB12 680  FOLATE --  FERRITIN 140  TIBC 235  IRON 26*  RETICCTPCT --   Coagulation: No results found for this basename: LABPROT:2,INR:2 in the last 72 hours Urine Drug Screen: Drugs of Abuse  No results found for this basename: labopia, cocainscrnur, labbenz, amphetmu, thcu, labbarb    Alcohol Level: No results found for this basename: ETH:2 in the last 72 hours Urinalysis:  Basename 10/12/11 1019  COLORURINE YELLOW  LABSPEC >1.030*  PHURINE 6.0  GLUCOSEU NEGATIVE  HGBUR LARGE*  BILIRUBINUR NEGATIVE  KETONESUR NEGATIVE  PROTEINUR 100*  UROBILINOGEN 0.2  NITRITE NEGATIVE  LEUKOCYTESUR NEGATIVE   Misc. Labs:  ABGS  Basename 10/15/11 0410  PHART 7.435  PO2ART 137.0*  TCO2 28.3  HCO3 34.1*   CULTURES Recent Results (from the past 240 hour(s))  CULTURE, BLOOD (ROUTINE X 2)     Status: Normal (Preliminary result)   Collection Time   10/12/11  9:59 AM      Component Value Range Status Comment   Specimen Description BLOOD LEFT WRIST   Final    Special Requests BOTTLES DRAWN AEROBIC AND ANAEROBIC 8CC   Final    Culture NO GROWTH 2 DAYS   Final    Report Status PENDING   Incomplete   CULTURE, BLOOD (ROUTINE X 2)     Status: Normal (Preliminary result)    Collection Time   10/12/11 10:03 AM      Component Value Range Status Comment   Specimen Description BLOOD RIGHT HAND   Final    Special Requests BOTTLES DRAWN AEROBIC AND ANAEROBIC 8CC   Final    Culture NO GROWTH 2 DAYS   Final    Report Status PENDING   Incomplete   URINE CULTURE     Status: Normal   Collection Time   10/12/11 10:19 AM      Component Value Range Status Comment   Specimen Description URINE, CLEAN CATCH   Final    Special Requests NONE   Final    Culture  Setup Time 10/12/2011 19:39   Final    Colony Count NO GROWTH   Final    Culture NO GROWTH   Final    Report Status 10/14/2011 FINAL   Final   MRSA PCR SCREENING     Status: Normal   Collection Time   10/12/11  3:21 PM      Component Value Range Status Comment   MRSA by PCR NEGATIVE  NEGATIVE Final    Studies/Results: Dg Chest Port 1 View  10/14/2011  *RADIOLOGY REPORT*  Clinical Data: Intubated.  PORTABLE CHEST - 1 VIEW  Comparison: 2 days ago.  Findings: Endotracheal tube tip 1.5 cm above the carina, directed toward the right mainstem bronchus.  Stable mildly enlarged cardiac silhouette.  Interval minimal patchy opacity at the right lung base.  Diffuse osteopenia.  IMPRESSION:  1.  The endotracheal tube is low in position.  It is recommended that this be retracted 4 cm. 2.  Minimal right basilar pneumonia or patchy atelectasis. 3.  Stable mild cardiomegaly.  Original Report Authenticated By: Darrol Angel, M.D.   Dg Chest Port 1v Same Day  10/14/2011  *RADIOLOGY REPORT*  Clinical Data: Endotracheal tube repositioning.  PORTABLE CHEST - 1 VIEW SAME DAY 10/14/2011 1437 hours:  Comparison: Portable chest x-ray earlier same date 1236 hours.  Findings: Endotracheal tube tip now in satisfactory position approximately 3 cm above the carina.  New mild atelectasis in the right upper lobe.  Minimal linear atelectasis at the right lung base.  Lungs otherwise clear.  Cardiac silhouette mildly enlarged but stable.  Pulmonary vascularity  normal.  Nasogastric tube looped in the stomach with its tip in the proximal body.  IMPRESSION:  1.  Endotracheal tube tip now in satisfactory position approximately 3 cm above the carina. 2.  New mild right upper lobe atelectasis.  Stable minimal linear atelectasis at the right lung base. 3.  Stable cardiomegaly without pulmonary edema.  Original Report Authenticated By: Arnell Sieving, M.D.    Medications:  Scheduled:   . antiseptic oral rinse  15 mL Mouth Rinse QID  . azithromycin  500  mg Intravenous Q24H  . cefTRIAXone (ROCEPHIN)  IV  1 g Intravenous Q24H  . chlorhexidine  15 mL Mouth Rinse BID  . docusate sodium  100 mg Oral BID  . enoxaparin (LOVENOX) injection  40 mg Subcutaneous Q24H  . etomidate      . furosemide  20 mg Intravenous Once  . insulin aspart  0-20 Units Subcutaneous TID WC  . insulin aspart  0-5 Units Subcutaneous QHS  . insulin glargine  10 Units Subcutaneous QHS  . ipratropium  0.5 mg Nebulization Q4H  . levalbuterol  0.63 mg Nebulization Q4H  . lidocaine (cardiac) 100 mg/75ml      . methylPREDNISolone (SOLU-MEDROL) injection  80 mg Intravenous Q6H  . pantoprazole (PROTONIX) IV  40 mg Intravenous Q24H  . propofol      . rocuronium      . sodium chloride      . succinylcholine      . DISCONTD: amLODipine  5 mg Oral Daily  . DISCONTD: aspirin EC  81 mg Oral Daily  . DISCONTD: benzonatate  100 mg Oral TID  . DISCONTD: cloNIDine  0.2 mg Transdermal Weekly  . DISCONTD: cloNIDine  0.1 mg Oral QHS  . DISCONTD: famotidine  20 mg Oral BID  . DISCONTD: gabapentin  300 mg Oral TID  . DISCONTD: guaiFENesin  600 mg Oral BID  . DISCONTD: losartan  100 mg Oral Daily   Continuous:   . sodium chloride 75 mL/hr at 10/15/11 0600  . propofol 35 mcg/kg/min (10/15/11 0600)  . DISCONTD: 0.9 % NaCl with KCl 20 mEq / L 75 mL/hr at 10/13/11 2245   FAO:ZHYQMVHQIONGE, acetaminophen, alum & mag hydroxide-simeth, hydrALAZINE, levalbuterol, nabumetone, ondansetron (ZOFRAN) IV,  ondansetron, oxyCODONE  Assesment: He was intubated yesterday after a COPD exacerbation. He remains on sedation at this point. He has fairly thick secretions which may make it was more difficult as far as weaning and extubation. Principal Problem:  *COPD with acute exacerbation Active Problems:  Tachycardia  Right Axis Deviation  Hyponatremia  Anemia  Chronic respiratory failure with hypoxia  Acute respiratory failure with hypercapnia  Chronic back pain  PNA (pneumonia)  Chest wall pain  Hypertension  Elevated CK  Hyperglycemia, drug-induced  Encephalopathy acute  Hyperkalemia    Plan: I think it's okay to see what he can do today as far as weaning I doubt if he'll come off the ventilator    LOS: 3 days   Brentt Fread L 10/15/2011, 8:25 AM

## 2011-10-15 NOTE — Progress Notes (Signed)
INITIAL ADULT NUTRITION ASSESSMENT Date: 10/15/2011   Time: 1:49 PM Reason for Assessment: Pt intubated   ASSESSMENT: Male 77 y.o.  Dx: COPD with acute exacerbation   Past Medical History  Diagnosis Date  . COPD (chronic obstructive pulmonary disease)   . Hypertension   . Hypercholesteremia   . Stroke   . Chronic respiratory failure with hypoxia   . Chronic back pain   . Elevated CK 10/13/2011    Scheduled Meds:   . antiseptic oral rinse  15 mL Mouth Rinse QID  . azithromycin  500 mg Intravenous Q24H  . cefTRIAXone (ROCEPHIN)  IV  1 g Intravenous Q24H  . chlorhexidine  15 mL Mouth Rinse BID  . enoxaparin (LOVENOX) injection  40 mg Subcutaneous Q24H  . etomidate      . insulin aspart  0-20 Units Subcutaneous TID WC  . insulin aspart  0-5 Units Subcutaneous QHS  . insulin glargine  10 Units Subcutaneous QHS  . ipratropium  0.5 mg Nebulization Q4H  . levalbuterol  0.63 mg Nebulization Q4H  . lidocaine (cardiac) 100 mg/73ml      . methylPREDNISolone (SOLU-MEDROL) injection  80 mg Intravenous Q6H  . pantoprazole (PROTONIX) IV  40 mg Intravenous Q24H  . propofol      . rocuronium      . sodium chloride  10-40 mL Intracatheter Q12H  . sodium chloride      . succinylcholine      . DISCONTD: aspirin EC  81 mg Oral Daily  . DISCONTD: cloNIDine  0.2 mg Transdermal Weekly  . DISCONTD: docusate sodium  100 mg Oral BID  . DISCONTD: gabapentin  300 mg Oral TID  . DISCONTD: guaiFENesin  600 mg Oral BID  . DISCONTD: losartan  100 mg Oral Daily   Continuous Infusions:   . dextrose 5 % and 0.45 % NaCl with KCl 20 mEq/L 100 mL/hr at 10/15/11 1300  . propofol 35 mcg/kg/min (10/15/11 1306)  . DISCONTD: sodium chloride 75 mL/hr at 10/15/11 0600   PRN Meds:.acetaminophen, acetaminophen, alum & mag hydroxide-simeth, hydrALAZINE, levalbuterol, nabumetone, ondansetron (ZOFRAN) IV, ondansetron, sodium chloride, DISCONTD: oxyCODONE  Ht: 5\' 9"  (175.3 cm)  Wt: 218 lb 7.6 oz (99.1  kg)  Ideal Wt: 70.7 kg % Ideal Wt: 140%  Usual Wt: Unknown at this time   Body mass index is 32.26 kg/(m^2).Obesity Class I  Food/Nutrition Related Hx: Pt intubated, sedated unable to provide nutrition hx .  Vent settings-MV-11.9, Temp: 37.1C, Propofol: 19.8 ml/hr ( if current rate maintained provides ~520kcal/day). D5 1/2 NS@ 100 provides ~400kcal per day. Energy total of ~ 920 kcal per day.    CMP     Component Value Date/Time   NA 141 10/15/2011 0445   K 4.3 10/15/2011 0445   CL 103 10/15/2011 0445   CO2 35* 10/15/2011 0445   GLUCOSE 124* 10/15/2011 0445   BUN 39* 10/15/2011 0445   CREATININE 1.62* 10/15/2011 0445   CALCIUM 9.6 10/15/2011 0445   PROT 6.6 10/15/2011 0445   ALBUMIN 2.9* 10/15/2011 0445   AST 59* 10/15/2011 0445   ALT 43 10/15/2011 0445   ALKPHOS 54 10/15/2011 0445   BILITOT 0.3 10/15/2011 0445   GFRNONAA 40* 10/15/2011 0445   GFRAA 46* 10/15/2011 0445   CBC    Component Value Date/Time   WBC 8.8 10/15/2011 0445   RBC 3.57* 10/15/2011 0445   HGB 10.5* 10/15/2011 0445   HCT 33.7* 10/15/2011 0445   PLT 178 10/15/2011 0445   MCV 94.4  10/15/2011 0445   MCH 29.4 10/15/2011 0445   MCHC 31.2 10/15/2011 0445   RDW 13.7 10/15/2011 0445   LYMPHSABS 1.2 10/12/2011 0958   MONOABS 0.8 10/12/2011 0958   EOSABS 0.1 10/12/2011 0958   BASOSABS 0.0 10/12/2011 0958   CBG (last 3)   Basename 10/15/11 0740 10/15/11 0356 10/14/11 2349  GLUCAP 124* 119* 128*      Intake/Output Summary (Last 24 hours) at 10/15/11 1352 Last data filed at 10/15/11 1300  Gross per 24 hour  Intake 2760.36 ml  Output    850 ml  Net 1910.36 ml     Diet Order: NPO  Supplements/Tube Feeding:none at this time  IVF:    dextrose 5 % and 0.45 % NaCl with KCl 20 mEq/L Last Rate: 100 mL/hr at 10/15/11 1300  propofol Last Rate: 35 mcg/kg/min (10/15/11 1306)  DISCONTD: sodium chloride Last Rate: 75 mL/hr at 10/15/11 0600    Estimated Nutritional Needs:   Kcal:1939-2143 kcal/day (to meet 60-70% of goal  provide-1163-1392 kcal/day) Protein:109 gr/day Fluid:1 ml/kcal  NUTRITION DIAGNOSIS: -Inadequate oral intake (NI-2.1).  Status: Ongoing  RELATED TO: inability to eat  AS EVIDENCE BY: mechanical ventilation  MONITORING/EVALUATION(Goals): -Vent status, nutrition plan   EDUCATION NEEDS: -Education not appropriate at this time  INTERVENTION: - If tube feeding becomes and option recommend: Initiate Osmolite 1.2 @ 20 ml/hr and increase by 10 ml every 4 hours to initial goal rate of 40 ml/hr. Add ProStat 30 ml BID per tube. Nutrition Support will provide:1352 kcal, 83 gr protein, ~787 ml water daily.   Dietitian [phone]  DOCUMENTATION CODES Per approved criteria  -Obesity Unspecified    Francene Boyers 10/15/2011, 1:49 PM

## 2011-10-15 NOTE — Progress Notes (Signed)
*  PRELIMINARY RESULTS* Echocardiogram 2D Echocardiogram has been performed.  Harold Johnson 10/15/2011, 8:53 AM

## 2011-10-15 NOTE — Progress Notes (Signed)
At 1455 hr RT attempted to wean  Patient on CPAP/PS 15/5 40% FIO2. Patient became very anxious and BP increased to 188/90. RT discontinued wean and placed back on rest mode for the remainder of the shift. RT suctioned for moderate amount of yellow thick secretions throughout the shift.  RN made aware and RT will continue to monitor.

## 2011-10-15 NOTE — Progress Notes (Signed)
Subjective: The patient is intubated and unresponsive. Nursing reports no changes overnight. The patient remained stable.    Objective: Vital signs in last 24 hours: Filed Vitals:   10/15/11 0600 10/15/11 0615 10/15/11 0630 10/15/11 0735  BP: 133/74 129/71 116/74   Pulse: 68 68 67   Temp:      TempSrc:      Resp: 0 0 0   Height:      Weight:      SpO2: 100% 100% 100% 100%    Intake/Output Summary (Last 24 hours) at 10/15/11 0913 Last data filed at 10/15/11 0600  Gross per 24 hour  Intake 1953.43 ml  Output    850 ml  Net 1103.43 ml    Weight change:   Physical exam: Lungs: Better air movement on the ventilator. Occasional crackles and wheezes auscultated, significantly less than yesterday. Heart: S1, S2, with a soft systolic murmur. Abdomen: Positive bowel sounds, soft, nontender, nondistended. Extremities: No pedal edema. Neurologic: He is unresponsive on the ventilator.     Lab Results: Basic Metabolic Panel:  Basename 10/15/11 0445 10/14/11 0657  NA 141 141  K 4.3 5.4*  CL 103 102  CO2 35* 34*  GLUCOSE 124* 141*  BUN 39* 28*  CREATININE 1.62* 1.16  CALCIUM 9.6 10.2  MG -- --  PHOS -- --   Liver Function Tests:  Basename 10/15/11 0445 10/12/11 0958  AST 59* 52*  ALT 43 27  ALKPHOS 54 63  BILITOT 0.3 0.6  PROT 6.6 8.6*  ALBUMIN 2.9* 3.9   No results found for this basename: LIPASE:2,AMYLASE:2 in the last 72 hours No results found for this basename: AMMONIA:2 in the last 72 hours CBC:  Basename 10/15/11 0445 10/13/11 0426 10/12/11 0958  WBC 8.8 10.2 --  NEUTROABS -- -- 7.2  HGB 10.5* 11.9* --  HCT 33.7* 37.9* --  MCV 94.4 92.9 --  PLT 178 182 --   Cardiac Enzymes:  Basename 10/15/11 0445 10/14/11 0657 10/13/11 0426 10/12/11 1526  CKTOTAL 1318* 5238* 3040* --  CKMB 18.4* 133.6* 73.0* --  CKMBINDEX -- -- -- --  TROPONINI -- <0.30 <0.30 <0.30   BNP:  Basename 10/14/11 0657 10/12/11 0958  PROBNP 321.9 408.7   D-Dimer:  Alvira Philips  10/12/11 0958  DDIMER 0.87*   CBG:  Basename 10/15/11 0740 10/15/11 0356 10/14/11 2349 10/14/11 1955 10/14/11 1611 10/14/11 1114  GLUCAP 124* 119* 128* 116* 99 127*   Hemoglobin A1C: No results found for this basename: HGBA1C in the last 72 hours Fasting Lipid Panel: No results found for this basename: CHOL,HDL,LDLCALC,TRIG,CHOLHDL,LDLDIRECT in the last 72 hours Thyroid Function Tests:  Basename 10/12/11 0958  TSH 0.543  T4TOTAL --  FREET4 1.20  T3FREE --  THYROIDAB --   Anemia Panel:  Basename 10/13/11 0426  VITAMINB12 680  FOLATE --  FERRITIN 140  TIBC 235  IRON 26*  RETICCTPCT --   Coagulation: No results found for this basename: LABPROT:2,INR:2 in the last 72 hours Urine Drug Screen: Drugs of Abuse  No results found for this basename: labopia,  cocainscrnur,  labbenz,  amphetmu,  thcu,  labbarb    Alcohol Level: No results found for this basename: ETH:2 in the last 72 hours Urinalysis:  Basename 10/12/11 1019  COLORURINE YELLOW  LABSPEC >1.030*  PHURINE 6.0  GLUCOSEU NEGATIVE  HGBUR LARGE*  BILIRUBINUR NEGATIVE  KETONESUR NEGATIVE  PROTEINUR 100*  UROBILINOGEN 0.2  NITRITE NEGATIVE  LEUKOCYTESUR NEGATIVE   Misc. Labs:   Micro: Recent Results (from the  past 240 hour(s))  CULTURE, BLOOD (ROUTINE X 2)     Status: Normal (Preliminary result)   Collection Time   10/12/11  9:59 AM      Component Value Range Status Comment   Specimen Description BLOOD LEFT WRIST   Final    Special Requests BOTTLES DRAWN AEROBIC AND ANAEROBIC 8CC   Final    Culture NO GROWTH 2 DAYS   Final    Report Status PENDING   Incomplete   CULTURE, BLOOD (ROUTINE X 2)     Status: Normal (Preliminary result)   Collection Time   10/12/11 10:03 AM      Component Value Range Status Comment   Specimen Description BLOOD RIGHT HAND   Final    Special Requests BOTTLES DRAWN AEROBIC AND ANAEROBIC 8CC   Final    Culture NO GROWTH 2 DAYS   Final    Report Status PENDING   Incomplete     URINE CULTURE     Status: Normal   Collection Time   10/12/11 10:19 AM      Component Value Range Status Comment   Specimen Description URINE, CLEAN CATCH   Final    Special Requests NONE   Final    Culture  Setup Time 10/12/2011 19:39   Final    Colony Count NO GROWTH   Final    Culture NO GROWTH   Final    Report Status 10/14/2011 FINAL   Final   MRSA PCR SCREENING     Status: Normal   Collection Time   10/12/11  3:21 PM      Component Value Range Status Comment   MRSA by PCR NEGATIVE  NEGATIVE Final     Studies/Results: Portable Chest Xray In Am  10/15/2011  *RADIOLOGY REPORT*  Clinical Data: Respiratory failure  PORTABLE CHEST - 1 VIEW  Comparison: Portable exam 0530 hours compared to 10/14/2011  Findings: Tip of endotracheal tube 3.6 cm above carina. Nasogastric tube extends into abdomen. Upper normal-sized cardiac silhouette. Mediastinal contours and pulmonary vascularity normal. Improved right upper lobe opacity. Minimal bibasilar atelectasis. No definite infiltrate, pleural effusion or pneumothorax.  IMPRESSION: Minimal bibasilar atelectasis. Improved right upper lobe aeration.  Original Report Authenticated By: Lollie Marrow, M.D.   Dg Chest Port 1 View  10/14/2011  *RADIOLOGY REPORT*  Clinical Data: Intubated.  PORTABLE CHEST - 1 VIEW  Comparison: 2 days ago.  Findings: Endotracheal tube tip 1.5 cm above the carina, directed toward the right mainstem bronchus.  Stable mildly enlarged cardiac silhouette.  Interval minimal patchy opacity at the right lung base.  Diffuse osteopenia.  IMPRESSION:  1.  The endotracheal tube is low in position.  It is recommended that this be retracted 4 cm. 2.  Minimal right basilar pneumonia or patchy atelectasis. 3.  Stable mild cardiomegaly.  Original Report Authenticated By: Darrol Angel, M.D.   Dg Chest Port 1v Same Day  10/14/2011  *RADIOLOGY REPORT*  Clinical Data: Endotracheal tube repositioning.  PORTABLE CHEST - 1 VIEW SAME DAY 10/14/2011 1437  hours:  Comparison: Portable chest x-ray earlier same date 1236 hours.  Findings: Endotracheal tube tip now in satisfactory position approximately 3 cm above the carina.  New mild atelectasis in the right upper lobe.  Minimal linear atelectasis at the right lung base.  Lungs otherwise clear.  Cardiac silhouette mildly enlarged but stable.  Pulmonary vascularity normal.  Nasogastric tube looped in the stomach with its tip in the proximal body.  IMPRESSION:  1.  Endotracheal  tube tip now in satisfactory position approximately 3 cm above the carina. 2.  New mild right upper lobe atelectasis.  Stable minimal linear atelectasis at the right lung base. 3.  Stable cardiomegaly without pulmonary edema.  Original Report Authenticated By: Arnell Sieving, M.D.    Medications:  Scheduled:    . antiseptic oral rinse  15 mL Mouth Rinse QID  . azithromycin  500 mg Intravenous Q24H  . cefTRIAXone (ROCEPHIN)  IV  1 g Intravenous Q24H  . chlorhexidine  15 mL Mouth Rinse BID  . docusate sodium  100 mg Oral BID  . enoxaparin (LOVENOX) injection  40 mg Subcutaneous Q24H  . etomidate      . furosemide  20 mg Intravenous Once  . insulin aspart  0-20 Units Subcutaneous TID WC  . insulin aspart  0-5 Units Subcutaneous QHS  . insulin glargine  10 Units Subcutaneous QHS  . ipratropium  0.5 mg Nebulization Q4H  . levalbuterol  0.63 mg Nebulization Q4H  . lidocaine (cardiac) 100 mg/14ml      . methylPREDNISolone (SOLU-MEDROL) injection  80 mg Intravenous Q6H  . pantoprazole (PROTONIX) IV  40 mg Intravenous Q24H  . propofol      . rocuronium      . sodium chloride      . succinylcholine      . DISCONTD: amLODipine  5 mg Oral Daily  . DISCONTD: aspirin EC  81 mg Oral Daily  . DISCONTD: benzonatate  100 mg Oral TID  . DISCONTD: cloNIDine  0.2 mg Transdermal Weekly  . DISCONTD: cloNIDine  0.1 mg Oral QHS  . DISCONTD: famotidine  20 mg Oral BID  . DISCONTD: gabapentin  300 mg Oral TID  . DISCONTD: guaiFENesin   600 mg Oral BID  . DISCONTD: losartan  100 mg Oral Daily   Continuous:    . sodium chloride 75 mL/hr at 10/15/11 0600  . propofol 35 mcg/kg/min (10/15/11 0600)  . DISCONTD: 0.9 % NaCl with KCl 20 mEq / L 75 mL/hr at 10/13/11 2245   ZOX:[id number], acetaminophen, alum & mag hydroxide-simeth, hydrALAZINE, levalbuterol, nabumetone, ondansetron (ZOFRAN) IV, ondansetron, oxyCODONE  Assessment: Principal Problem:  *COPD with acute exacerbation Active Problems:  Chronic respiratory failure with hypoxia  Acute respiratory failure with hypercapnia  PNA (pneumonia)  Tachycardia  Right Axis Deviation  Hyponatremia  Anemia  Chronic back pain  Chest wall pain  Hypertension  Elevated CK  Hyperglycemia, drug-induced  Encephalopathy acute  Hyperkalemia  Acute renal failure  Acute respiratory acidosis   1. Acute hypercapnic respiratory failure/respiratory acidosis superimposed on chronic hypoxic respiratory failure. Status post intubation/mechanical ventilation on 10/14/2011 worsening respiratory status. Current ABG is much improved. His respiratory acidosis has resolved. His PCO2 is 52, down from 98 yesterday. It is likely that high-dose oxygen infusion with nebulizers yesterday decreased his respiratory drive. Dr. Juanetta Gosling has been consulted. His assistance is appreciated.  COPD with acute exacerbation. We'll continue ceftriaxone, azithromycin, steroids, and bronchodilators. CT angiogram of his chest was negative for PE.  Elevated total CK/CK-MB. Possibly related to mild rhabdomyolysis in the setting of increased work of breathing. His total CK is trending downward. Obtaining a 2-D echocardiogram to evaluate for wall motion abnormalities.  Acute renal failure. His creatinine increased from yesterday. He was given Lasix empirically yesterday because of worsening respiratory failure. His chest x-ray reveals no pulmonary edema.  Hyperkalemia. Resolved, status post Lasix. Holding the  ARB.  Steroid-induced hyperglycemia. We'll continue NovoLog and Lantus.  Hypertension. His blood pressures  are low normal status post intubation. Holding Norvasc, clonidine, and losartan/HCTZ. We'll continue hydralazine IV when necessary.  Mild normocytic anemia. Etiology likely multifactorial. We'll continue to follow.     Plan:  1. We'll add dextrose to the IV fluids to give the patient a calorie source. Will increase the rate. We'll hold off on tube feedings for now.  2. We'll check the results of the 2-D echocardiogram pending. 3. Otherwise, continue current management.    Critical care time: 40 minutes.    LOS: 3 days   Alfons Sulkowski 10/15/2011, 9:13 AM

## 2011-10-15 NOTE — Progress Notes (Signed)
PATIENT HAS RESTED WELL BBS DIMINISHED SP02 99% SUCTION WHITE TO TAN THICK SECRETIONS PATIENT HAS HAD ALL NEBULIZER TREATMENTS

## 2011-10-15 NOTE — Progress Notes (Signed)
UR Chart Review Completed  

## 2011-10-16 ENCOUNTER — Inpatient Hospital Stay (HOSPITAL_COMMUNITY): Payer: Medicare PPO

## 2011-10-16 DIAGNOSIS — M6282 Rhabdomyolysis: Secondary | ICD-10-CM

## 2011-10-16 LAB — BLOOD GAS, ARTERIAL
Bicarbonate: 31.1 mEq/L — ABNORMAL HIGH (ref 20.0–24.0)
PEEP: 5 cmH2O
Patient temperature: 37
TCO2: 27.8 mmol/L (ref 0–100)
pCO2 arterial: 55.7 mmHg — ABNORMAL HIGH (ref 35.0–45.0)
pH, Arterial: 7.366 (ref 7.350–7.450)
pO2, Arterial: 129 mmHg — ABNORMAL HIGH (ref 80.0–100.0)

## 2011-10-16 LAB — CK TOTAL AND CKMB (NOT AT ARMC)
CK, MB: 7.6 ng/mL (ref 0.3–4.0)
Relative Index: 1.4 (ref 0.0–2.5)
Total CK: 551 U/L — ABNORMAL HIGH (ref 7–232)

## 2011-10-16 LAB — PRO B NATRIURETIC PEPTIDE: Pro B Natriuretic peptide (BNP): 226.5 pg/mL (ref 0–450)

## 2011-10-16 LAB — BASIC METABOLIC PANEL
Calcium: 9.7 mg/dL (ref 8.4–10.5)
GFR calc Af Amer: 58 mL/min — ABNORMAL LOW (ref >90)
GFR calc non Af Amer: 50 mL/min — ABNORMAL LOW (ref >90)
Glucose, Bld: 138 mg/dL — ABNORMAL HIGH (ref 70–99)
Potassium: 4.2 mEq/L (ref 3.5–5.1)
Sodium: 143 mEq/L (ref 135–145)

## 2011-10-16 LAB — GLUCOSE, CAPILLARY
Glucose-Capillary: 124 mg/dL — ABNORMAL HIGH (ref 70–99)
Glucose-Capillary: 112 mg/dL — ABNORMAL HIGH (ref 70–99)

## 2011-10-16 MED ORDER — JEVITY 1.2 CAL PO LIQD
1000.0000 mL | ORAL | Status: DC
  Administered 2011-10-16: 13:00:00
  Administered 2011-10-19: 60 mL/h
  Filled 2011-10-16 (×3): qty 1000

## 2011-10-16 MED ORDER — PANTOPRAZOLE SODIUM 40 MG PO PACK
40.0000 mg | PACK | Freq: Every day | ORAL | Status: DC
  Administered 2011-10-16 – 2011-10-19 (×4): 40 mg
  Filled 2011-10-16 (×7): qty 20

## 2011-10-16 NOTE — Progress Notes (Signed)
Subjective: He failed weaning yesterday. He has a large amount of secretions and has required frequent suctioning. He remains intubated and on the ventilator  Objective: Vital signs in last 24 hours: Temp:  [98.1 F (36.7 C)-99.3 F (37.4 C)] 99.2 F (37.3 C) (07/11 0404) Pulse Rate:  [66-104] 97  (07/11 0600) Resp:  [0-25] 21  (07/11 0600) BP: (104-192)/(61-113) 147/80 mmHg (07/11 0600) SpO2:  [98 %-100 %] 100 % (07/11 0600) FiO2 (%):  [39.6 %-40.9 %] 39.8 % (07/11 0600) Weight:  [99.7 kg (219 lb 12.8 oz)] 99.7 kg (219 lb 12.8 oz) (07/11 0500) Weight change: 0.6 kg (1 lb 5.2 oz) Last BM Date: 10/14/11  Intake/Output from previous day: 07/10 0701 - 07/11 0700 In: 2869.4 [I.V.:2589.4; NG/GT:30; IV Piggyback:250] Out: 2050 [Urine:1400; Emesis/NG output:650]  PHYSICAL EXAM General appearance: no distress and Intubated and sedated Resp: rhonchi bilaterally Cardio: regular rate and rhythm, S1, S2 normal, no murmur, click, rub or gallop GI: soft, non-tender; bowel sounds normal; no masses,  no organomegaly Extremities: extremities normal, atraumatic, no cyanosis or edema  Lab Results:    Basic Metabolic Panel:  Basename 10/16/11 0533 10/15/11 0445  NA 143 141  K 4.2 4.3  CL 105 103  CO2 32 35*  GLUCOSE 138* 124*  BUN 32* 39*  CREATININE 1.34 1.62*  CALCIUM 9.7 9.6  MG -- --  PHOS -- --   Liver Function Tests:  Basename 10/15/11 0445  AST 59*  ALT 43  ALKPHOS 54  BILITOT 0.3  PROT 6.6  ALBUMIN 2.9*   No results found for this basename: LIPASE:2,AMYLASE:2 in the last 72 hours No results found for this basename: AMMONIA:2 in the last 72 hours CBC:  Basename 10/15/11 0445  WBC 8.8  NEUTROABS --  HGB 10.5*  HCT 33.7*  MCV 94.4  PLT 178   Cardiac Enzymes:  Basename 10/16/11 0533 10/15/11 0445 10/14/11 0657  CKTOTAL 551* 1318* 5238*  CKMB 7.6* 18.4* 133.6*  CKMBINDEX -- -- --  TROPONINI -- -- <0.30   BNP:  Basename 10/14/11 0657  PROBNP 321.9    D-Dimer: No results found for this basename: DDIMER:2 in the last 72 hours CBG:  Basename 10/15/11 2117 10/15/11 1652 10/15/11 1129 10/15/11 0740 10/15/11 0356 10/14/11 2349  GLUCAP 133* 168* 120* 124* 119* 128*   Hemoglobin A1C: No results found for this basename: HGBA1C in the last 72 hours Fasting Lipid Panel: No results found for this basename: CHOL,HDL,LDLCALC,TRIG,CHOLHDL,LDLDIRECT in the last 72 hours Thyroid Function Tests: No results found for this basename: TSH,T4TOTAL,FREET4,T3FREE,THYROIDAB in the last 72 hours Anemia Panel: No results found for this basename: VITAMINB12,FOLATE,FERRITIN,TIBC,IRON,RETICCTPCT in the last 72 hours Coagulation: No results found for this basename: LABPROT:2,INR:2 in the last 72 hours Urine Drug Screen: Drugs of Abuse  No results found for this basename: labopia, cocainscrnur, labbenz, amphetmu, thcu, labbarb    Alcohol Level: No results found for this basename: ETH:2 in the last 72 hours Urinalysis: No results found for this basename: COLORURINE:2,APPERANCEUR:2,LABSPEC:2,PHURINE:2,GLUCOSEU:2,HGBUR:2,BILIRUBINUR:2,KETONESUR:2,PROTEINUR:2,UROBILINOGEN:2,NITRITE:2,LEUKOCYTESUR:2 in the last 72 hours Misc. Labs:  ABGS  Basename 10/16/11 0430  PHART 7.366  PO2ART 129.0*  TCO2 27.8  HCO3 31.1*   CULTURES Recent Results (from the past 240 hour(s))  CULTURE, BLOOD (ROUTINE X 2)     Status: Normal (Preliminary result)   Collection Time   10/12/11  9:59 AM      Component Value Range Status Comment   Specimen Description BLOOD LEFT WRIST   Final    Special Requests BOTTLES DRAWN AEROBIC AND  ANAEROBIC 8CC   Final    Culture NO GROWTH 3 DAYS   Final    Report Status PENDING   Incomplete   CULTURE, BLOOD (ROUTINE X 2)     Status: Normal (Preliminary result)   Collection Time   10/12/11 10:03 AM      Component Value Range Status Comment   Specimen Description BLOOD RIGHT HAND   Final    Special Requests BOTTLES DRAWN AEROBIC AND ANAEROBIC  8CC   Final    Culture NO GROWTH 3 DAYS   Final    Report Status PENDING   Incomplete   URINE CULTURE     Status: Normal   Collection Time   10/12/11 10:19 AM      Component Value Range Status Comment   Specimen Description URINE, CLEAN CATCH   Final    Special Requests NONE   Final    Culture  Setup Time 10/12/2011 19:39   Final    Colony Count NO GROWTH   Final    Culture NO GROWTH   Final    Report Status 10/14/2011 FINAL   Final   MRSA PCR SCREENING     Status: Normal   Collection Time   10/12/11  3:21 PM      Component Value Range Status Comment   MRSA by PCR NEGATIVE  NEGATIVE Final    Studies/Results: Portable Chest Xray In Am  10/15/2011  *RADIOLOGY REPORT*  Clinical Data: Respiratory failure  PORTABLE CHEST - 1 VIEW  Comparison: Portable exam 0530 hours compared to 10/14/2011  Findings: Tip of endotracheal tube 3.6 cm above carina. Nasogastric tube extends into abdomen. Upper normal-sized cardiac silhouette. Mediastinal contours and pulmonary vascularity normal. Improved right upper lobe opacity. Minimal bibasilar atelectasis. No definite infiltrate, pleural effusion or pneumothorax.  IMPRESSION: Minimal bibasilar atelectasis. Improved right upper lobe aeration.  Original Report Authenticated By: Lollie Marrow, M.D.   Dg Chest Port 1 View  10/14/2011  *RADIOLOGY REPORT*  Clinical Data: Intubated.  PORTABLE CHEST - 1 VIEW  Comparison: 2 days ago.  Findings: Endotracheal tube tip 1.5 cm above the carina, directed toward the right mainstem bronchus.  Stable mildly enlarged cardiac silhouette.  Interval minimal patchy opacity at the right lung base.  Diffuse osteopenia.  IMPRESSION:  1.  The endotracheal tube is low in position.  It is recommended that this be retracted 4 cm. 2.  Minimal right basilar pneumonia or patchy atelectasis. 3.  Stable mild cardiomegaly.  Original Report Authenticated By: Darrol Angel, M.D.   Dg Chest Port 1v Same Day  10/15/2011  *RADIOLOGY REPORT*  Clinical  Data: PICC placement.  PORTABLE CHEST - 1 VIEW SAME DAY  Comparison: Chest 10/15/2011 at 6:35 a.m.  Findings: Right PICC is in place with the tip in the lower superior vena cava.  Support apparatus is otherwise unchanged.  Aeration is unchanged.  Heart size normal.  IMPRESSION: PICC in the lower superior vena cava.  No other change.  Original Report Authenticated By: Bernadene Bell. Maricela Curet, M.D.   Dg Chest Port 1v Same Day  10/14/2011  *RADIOLOGY REPORT*  Clinical Data: Endotracheal tube repositioning.  PORTABLE CHEST - 1 VIEW SAME DAY 10/14/2011 1437 hours:  Comparison: Portable chest x-ray earlier same date 1236 hours.  Findings: Endotracheal tube tip now in satisfactory position approximately 3 cm above the carina.  New mild atelectasis in the right upper lobe.  Minimal linear atelectasis at the right lung base.  Lungs otherwise clear.  Cardiac silhouette mildly  enlarged but stable.  Pulmonary vascularity normal.  Nasogastric tube looped in the stomach with its tip in the proximal body.  IMPRESSION:  1.  Endotracheal tube tip now in satisfactory position approximately 3 cm above the carina. 2.  New mild right upper lobe atelectasis.  Stable minimal linear atelectasis at the right lung base. 3.  Stable cardiomegaly without pulmonary edema.  Original Report Authenticated By: Arnell Sieving, M.D.    Medications:  Scheduled:   . antiseptic oral rinse  15 mL Mouth Rinse QID  . azithromycin  500 mg Intravenous Q24H  . cefTRIAXone (ROCEPHIN)  IV  1 g Intravenous Q24H  . chlorhexidine  15 mL Mouth Rinse BID  . enoxaparin (LOVENOX) injection  40 mg Subcutaneous Q24H  . insulin aspart  0-20 Units Subcutaneous TID WC  . insulin aspart  0-5 Units Subcutaneous QHS  . insulin glargine  10 Units Subcutaneous QHS  . ipratropium  0.5 mg Nebulization Q4H  . levalbuterol  0.63 mg Nebulization Q4H  . methylPREDNISolone (SOLU-MEDROL) injection  80 mg Intravenous Q6H  . pantoprazole (PROTONIX) IV  40 mg Intravenous  Q24H  . sodium chloride  10-40 mL Intracatheter Q12H  . DISCONTD: docusate sodium  100 mg Oral BID   Continuous:   . dextrose 5 % and 0.45 % NaCl with KCl 20 mEq/L 100 mL/hr at 10/16/11 0600  . propofol 17 mcg/kg/min (10/16/11 0658)  . DISCONTD: sodium chloride 75 mL/hr at 10/15/11 0600   ZOX:[id number], acetaminophen, alum & mag hydroxide-simeth, hydrALAZINE, levalbuterol, nabumetone, ondansetron (ZOFRAN) IV, ondansetron, sodium chloride, DISCONTD: oxyCODONE  Assesment: He has acute on chronic respiratory failure. He has a lot of secretions. I think his chest is in groove somewhat. He has multiple other medical problems as listed below. He remains on ventilator support Principal Problem:  *COPD with acute exacerbation Active Problems:  Tachycardia  Right Axis Deviation  Hyponatremia  Anemia  Chronic respiratory failure with hypoxia  Acute respiratory failure with hypercapnia  Chronic back pain  PNA (pneumonia)  Chest wall pain  Hypertension  Elevated CK  Hyperglycemia, drug-induced  Encephalopathy acute  Hyperkalemia  Acute renal failure  Acute respiratory acidosis    Plan: Try again today to see if he can be weaned    LOS: 4 days   Aerik Polan L 10/16/2011, 8:01 AM

## 2011-10-16 NOTE — Progress Notes (Signed)
CRITICAL VALUE ALERT  Critical value received:  CKMB 7.6 (improved, last 18.4)  Date of notification:  10/16/11  Time of notification:  0740  Critical value read back:yes  Nurse who received alert:  Lovena Neighbours RN  MD notified (1st page):  [phone]  Time of first page:  [phone]  MD notified (2nd page):n/a  Time of second page:n/a  Responding MD:  Dr Lendell Caprice  Time MD responded:  Dr already aware CKMB is critical.

## 2011-10-16 NOTE — Progress Notes (Signed)
Chart reviewed. Discussed with Dr. Juanetta Gosling.  Subjective: Nods "yes" when asked if short of breath.  Objective: Vital signs in last 24 hours: Filed Vitals:   10/16/11 0432 10/16/11 0500 10/16/11 0600 10/16/11 0807  BP: 192/113 163/93 147/80   Pulse:  104 97   Temp:      TempSrc:      Resp:  25 21   Height:      Weight:  99.7 kg (219 lb 12.8 oz)    SpO2:  100% 100% 99%   Weight change: 0.6 kg (1 lb 5.2 oz)  Intake/Output Summary (Last 24 hours) at 10/16/11 1057 Last data filed at 10/16/11 0700  Gross per 24 hour  Intake 2641.1 ml  Output   2050 ml  Net  591.1 ml   General: intubated. Eyes open. Nods yes and no. Auscultation bilaterally without wheezes rhonchi or rales Cardiovascular regular rate and rhythm without murmurs cath rales Abdomen soft nontender nondistended Extremities no clubbing cyanosis or edema  Lab Results: Basic Metabolic Panel:  Lab 10/16/11 1610 10/15/11 0445  NA 143 141  K 4.2 4.3  CL 105 103  CO2 32 35*  GLUCOSE 138* 124*  BUN 32* 39*  CREATININE 1.34 1.62*  CALCIUM 9.7 9.6  MG -- --  PHOS -- --   Liver Function Tests:  Lab 10/15/11 0445 10/12/11 0958  AST 59* 52*  ALT 43 27  ALKPHOS 54 63  BILITOT 0.3 0.6  PROT 6.6 8.6*  ALBUMIN 2.9* 3.9   No results found for this basename: LIPASE:2,AMYLASE:2 in the last 168 hours No results found for this basename: AMMONIA:2 in the last 168 hours CBC:  Lab 10/15/11 0445 10/13/11 0426 10/12/11 0958  WBC 8.8 10.2 --  NEUTROABS -- -- 7.2  HGB 10.5* 11.9* --  HCT 33.7* 37.9* --  MCV 94.4 92.9 --  PLT 178 182 --   Cardiac Enzymes:  Lab 10/16/11 0533 10/15/11 0445 10/14/11 0657 10/13/11 0426 10/12/11 1526  CKTOTAL 551* 1318* 5238* -- --  CKMB 7.6* 18.4* 133.6* -- --  CKMBINDEX -- -- -- -- --  TROPONINI -- -- <0.30 <0.30 <0.30   BNP:  Lab 10/14/11 0657 10/12/11 0958  PROBNP 321.9 408.7   D-Dimer:  Lab 10/12/11 0958  DDIMER 0.87*   CBG:  Lab 10/16/11 0815 10/15/11 2117 10/15/11  1652 10/15/11 1129 10/15/11 0740 10/15/11 0356  GLUCAP 124* 133* 168* 120* 124* 119*   Hemoglobin A1C: No results found for this basename: HGBA1C in the last 168 hours Fasting Lipid Panel: No results found for this basename: CHOL,HDL,LDLCALC,TRIG,CHOLHDL,LDLDIRECT in the last 960 hours Thyroid Function Tests:  Lab 10/12/11 0958  TSH 0.543  T4TOTAL --  FREET4 1.20  T3FREE --  THYROIDAB --   Coagulation: No results found for this basename: LABPROT:4,INR:4 in the last 168 hours Anemia Panel:  Lab 10/13/11 0426  VITAMINB12 680  FOLATE --  FERRITIN 140  TIBC 235  IRON 26*  RETICCTPCT --   Urine Drug Screen: Drugs of Abuse  No results found for this basename: labopia, cocainscrnur, labbenz, amphetmu, thcu, labbarb    Alcohol Level: No results found for this basename: ETH:2 in the last 168 hours Urinalysis:  Lab 10/12/11 1019  COLORURINE YELLOW  LABSPEC >1.030*  PHURINE 6.0  GLUCOSEU NEGATIVE  HGBUR LARGE*  BILIRUBINUR NEGATIVE  KETONESUR NEGATIVE  PROTEINUR 100*  UROBILINOGEN 0.2  NITRITE NEGATIVE  LEUKOCYTESUR NEGATIVE   Micro Results: Recent Results (from the past 240 hour(s))  CULTURE, BLOOD (ROUTINE X 2)  Status: Normal (Preliminary result)   Collection Time   10/12/11  9:59 AM      Component Value Range Status Comment   Specimen Description BLOOD LEFT WRIST   Final    Special Requests BOTTLES DRAWN AEROBIC AND ANAEROBIC 8CC   Final    Culture NO GROWTH 4 DAYS   Final    Report Status PENDING   Incomplete   CULTURE, BLOOD (ROUTINE X 2)     Status: Normal (Preliminary result)   Collection Time   10/12/11 10:03 AM      Component Value Range Status Comment   Specimen Description BLOOD RIGHT HAND   Final    Special Requests BOTTLES DRAWN AEROBIC AND ANAEROBIC 8CC   Final    Culture NO GROWTH 4 DAYS   Final    Report Status PENDING   Incomplete   URINE CULTURE     Status: Normal   Collection Time   10/12/11 10:19 AM      Component Value Range Status Comment    Specimen Description URINE, CLEAN CATCH   Final    Special Requests NONE   Final    Culture  Setup Time 10/12/2011 19:39   Final    Colony Count NO GROWTH   Final    Culture NO GROWTH   Final    Report Status 10/14/2011 FINAL   Final   MRSA PCR SCREENING     Status: Normal   Collection Time   10/12/11  3:21 PM      Component Value Range Status Comment   MRSA by PCR NEGATIVE  NEGATIVE Final    Studies/Results: Dg Chest Port 1 View  10/16/2011  *RADIOLOGY REPORT*  Clinical Data: Respiratory failure  PORTABLE CHEST - 1 VIEW  Comparison: Portable chest x-ray of 10/15/2011  Findings: There is little change in aeration.  The endotracheal tube and right PICC line are unchanged in position and cardiomegaly is stable.  IMPRESSION: No significant change in aeration and placement of PICC line.  Original Report Authenticated By: Juline Patch, M.D.   Portable Chest Xray In Am  10/15/2011  *RADIOLOGY REPORT*  Clinical Data: Respiratory failure  PORTABLE CHEST - 1 VIEW  Comparison: Portable exam 0530 hours compared to 10/14/2011  Findings: Tip of endotracheal tube 3.6 cm above carina. Nasogastric tube extends into abdomen. Upper normal-sized cardiac silhouette. Mediastinal contours and pulmonary vascularity normal. Improved right upper lobe opacity. Minimal bibasilar atelectasis. No definite infiltrate, pleural effusion or pneumothorax.  IMPRESSION: Minimal bibasilar atelectasis. Improved right upper lobe aeration.  Original Report Authenticated By: Lollie Marrow, M.D.   Dg Chest Port 1 View  10/14/2011  *RADIOLOGY REPORT*  Clinical Data: Intubated.  PORTABLE CHEST - 1 VIEW  Comparison: 2 days ago.  Findings: Endotracheal tube tip 1.5 cm above the carina, directed toward the right mainstem bronchus.  Stable mildly enlarged cardiac silhouette.  Interval minimal patchy opacity at the right lung base.  Diffuse osteopenia.  IMPRESSION:  1.  The endotracheal tube is low in position.  It is recommended that this be  retracted 4 cm. 2.  Minimal right basilar pneumonia or patchy atelectasis. 3.  Stable mild cardiomegaly.  Original Report Authenticated By: Darrol Angel, M.D.   Dg Chest Port 1v Same Day  10/15/2011  *RADIOLOGY REPORT*  Clinical Data: PICC placement.  PORTABLE CHEST - 1 VIEW SAME DAY  Comparison: Chest 10/15/2011 at 6:35 a.m.  Findings: Right PICC is in place with the tip in the lower superior vena  cava.  Support apparatus is otherwise unchanged.  Aeration is unchanged.  Heart size normal.  IMPRESSION: PICC in the lower superior vena cava.  No other change.  Original Report Authenticated By: Bernadene Bell. Maricela Curet, M.D.   Dg Chest Port 1v Same Day  10/14/2011  *RADIOLOGY REPORT*  Clinical Data: Endotracheal tube repositioning.  PORTABLE CHEST - 1 VIEW SAME DAY 10/14/2011 1437 hours:  Comparison: Portable chest x-ray earlier same date 1236 hours.  Findings: Endotracheal tube tip now in satisfactory position approximately 3 cm above the carina.  New mild atelectasis in the right upper lobe.  Minimal linear atelectasis at the right lung base.  Lungs otherwise clear.  Cardiac silhouette mildly enlarged but stable.  Pulmonary vascularity normal.  Nasogastric tube looped in the stomach with its tip in the proximal body.  IMPRESSION:  1.  Endotracheal tube tip now in satisfactory position approximately 3 cm above the carina. 2.  New mild right upper lobe atelectasis.  Stable minimal linear atelectasis at the right lung base. 3.  Stable cardiomegaly without pulmonary edema.  Original Report Authenticated By: Arnell Sieving, M.D.   Echocardiogram:  - Procedure narrative: Transthoracic echocardiography. Image quality was suboptimal with incomplete image acquisition. - Left ventricle: The cavity size was normal. There was moderate concentric hypertrophy. Systolic function was vigorous. The estimated ejection fraction was in the range of 65% to 70%. Wall motion was normal; there were no regional wall motion  abnormalities. Doppler parameters are consistent with abnormal left ventricular relaxation (grade 1 diastolic dysfunction). - Aortic valve: Sclerosis without stenosis. - Left atrium: The atrium was at the upper limits of normal in size.   Scheduled Meds:   . antiseptic oral rinse  15 mL Mouth Rinse QID  . azithromycin  500 mg Intravenous Q24H  . cefTRIAXone (ROCEPHIN)  IV  1 g Intravenous Q24H  . chlorhexidine  15 mL Mouth Rinse BID  . enoxaparin (LOVENOX) injection  40 mg Subcutaneous Q24H  . insulin aspart  0-20 Units Subcutaneous TID WC  . insulin aspart  0-5 Units Subcutaneous QHS  . insulin glargine  10 Units Subcutaneous QHS  . ipratropium  0.5 mg Nebulization Q4H  . levalbuterol  0.63 mg Nebulization Q4H  . methylPREDNISolone (SOLU-MEDROL) injection  80 mg Intravenous Q6H  . pantoprazole (PROTONIX) IV  40 mg Intravenous Q24H  . sodium chloride  10-40 mL Intracatheter Q12H   Continuous Infusions:   . dextrose 5 % and 0.45 % NaCl with KCl 20 mEq/L 100 mL/hr at 10/16/11 0600  . propofol 9.6 mcg/kg/min (10/16/11 1009)   PRN Meds:.acetaminophen, acetaminophen, alum & mag hydroxide-simeth, hydrALAZINE, levalbuterol, nabumetone, ondansetron (ZOFRAN) IV, ondansetron, sodium chloride Assessment/Plan: Principal Problem:  *Acute respiratory failure with hypercapnia Active Problems:  COPD with acute exacerbation  PNA (pneumonia)  Anemia  Hypertension  Hyperglycemia, drug-induced  Acute renal failure  Tachycardia  Right Axis Deviation  Chronic respiratory failure with hypoxia  Chronic back pain  Rhabdomyolysis  Encephalopathy acute  Dr. Juanetta Gosling reports patient will likely be unable to come off the vent today. He has copious secretions. We'll start tube feeds. Check pro BNP. Decrease IV fluids. Continue antibiotics.   LOS: 4 days   Harold Johnson L 10/16/2011, 10:57 AM

## 2011-10-16 NOTE — Progress Notes (Signed)
Nutrition Support Note  Patient has endotracheal tube in place with tip of tube 1.5 cm above the carina. Jev. 1.2 is infusing @ 20 ml/hr.  ml . Tube feeding regimen currently providing  576 kcal, 26.6 grams protein, and 387 ml H2O.   Propofol infusing @ 10 ml/hr providing ~264 kcal/day.  IVF-D5 1/2 NS decr to 20 ml/hr.  Free water flushes:none noted  Residuals: none noted  Last bm: 10/13/11    Royann Shivers MS,RD,LDN Pager: [phone]

## 2011-10-17 ENCOUNTER — Inpatient Hospital Stay (HOSPITAL_COMMUNITY): Payer: Medicare PPO

## 2011-10-17 DIAGNOSIS — R7309 Other abnormal glucose: Secondary | ICD-10-CM

## 2011-10-17 DIAGNOSIS — T50904A Poisoning by unspecified drugs, medicaments and biological substances, undetermined, initial encounter: Secondary | ICD-10-CM

## 2011-10-17 LAB — CULTURE, BLOOD (ROUTINE X 2): Culture: NO GROWTH

## 2011-10-17 LAB — GLUCOSE, CAPILLARY
Glucose-Capillary: 134 mg/dL — ABNORMAL HIGH (ref 70–99)
Glucose-Capillary: 100 mg/dL — ABNORMAL HIGH (ref 70–99)
Glucose-Capillary: 118 mg/dL — ABNORMAL HIGH (ref 70–99)

## 2011-10-17 LAB — TRIGLYCERIDES: Triglycerides: 150 mg/dL — ABNORMAL HIGH (ref <150)

## 2011-10-17 LAB — CK TOTAL AND CKMB (NOT AT ARMC): Relative Index: 1.3 (ref 0.0–2.5)

## 2011-10-17 MED ORDER — METHYLPREDNISOLONE SODIUM SUCC 125 MG IJ SOLR
80.0000 mg | Freq: Two times a day (BID) | INTRAMUSCULAR | Status: DC
  Administered 2011-10-17 – 2011-10-20 (×6): 80 mg via INTRAVENOUS
  Filled 2011-10-17 (×7): qty 2

## 2011-10-17 MED ORDER — INSULIN ASPART 100 UNIT/ML ~~LOC~~ SOLN
0.0000 [IU] | SUBCUTANEOUS | Status: DC
  Administered 2011-10-17 (×2): 1 [IU] via SUBCUTANEOUS
  Administered 2011-10-17: 2 [IU] via SUBCUTANEOUS
  Administered 2011-10-17: 5 [IU] via SUBCUTANEOUS
  Administered 2011-10-17: 2 [IU] via SUBCUTANEOUS
  Administered 2011-10-18: 1 [IU] via SUBCUTANEOUS
  Administered 2011-10-18: 2 [IU] via SUBCUTANEOUS
  Administered 2011-10-18 – 2011-10-19 (×5): 1 [IU] via SUBCUTANEOUS
  Administered 2011-10-19: 2 [IU] via SUBCUTANEOUS
  Administered 2011-10-19 – 2011-10-20 (×2): 1 [IU] via SUBCUTANEOUS
  Administered 2011-10-20: 2 [IU] via SUBCUTANEOUS
  Administered 2011-10-21 – 2011-10-22 (×4): 1 [IU] via SUBCUTANEOUS
  Administered 2011-10-22: 2 [IU] via SUBCUTANEOUS
  Administered 2011-10-22: 1 [IU] via SUBCUTANEOUS

## 2011-10-17 MED ORDER — INSULIN GLARGINE 100 UNIT/ML ~~LOC~~ SOLN
5.0000 [IU] | Freq: Every day | SUBCUTANEOUS | Status: DC
  Administered 2011-10-17 – 2011-10-19 (×3): 5 [IU] via SUBCUTANEOUS

## 2011-10-17 NOTE — Progress Notes (Signed)
Nutrition Support Note  Patient has endotracheal tube in place with tip of tube 1.5 cm above the carina. Jev. 1.2 is infusing @ 60 ml/hr.  ml . Tube feeding regimen currently providing   1728 kcal, 79.9 grams protein, and 1152 ml H2O.   Propofol infusing @ 17 ml/hr providing ~448 kcal/day.   IVF-D5 1/2 NS decr to 20 ml/hr.  Free water flushes:none noted  Residuals: none noted  Last bm: 10/13/11  Nutrition support meeting 100% of est energy,73% protein needs.  Royann Shivers MS,RD,LDN Pager: [phone]

## 2011-10-17 NOTE — Progress Notes (Signed)
RT weaned Pt today and his BP and HR increased. And he became very agitated. He was coughing on the vent. RT cjanged the inline suction and the nurse had to increase sedation a lot today.

## 2011-10-17 NOTE — Progress Notes (Signed)
Discussed with Dr. Juanetta Gosling.  Objective: Vital signs in last 24 hours: Filed Vitals:   10/17/11 0400 10/17/11 0403 10/17/11 0500 10/17/11 0600  BP: 169/94 169/94 154/78 149/79  Pulse: 93  97 95  Temp: 98.5 F (36.9 C)     TempSrc: Axillary     Resp: 16  19 17   Height:      Weight:   98.7 kg (217 lb 9.5 oz)   SpO2: 100%  100% 100%   Weight change: -1 kg (-2 lb 3.3 oz)  Intake/Output Summary (Last 24 hours) at 10/17/11 0824 Last data filed at 10/17/11 0643  Gross per 24 hour  Intake 1429.74 ml  Output   2050 ml  Net -620.26 ml   General: intubated. Eyes closed. Auscultation Occasional wheeze, no rhochi. diminished Cardiovascular fast, regular Abdomen soft nontender nondistended Extremities no clubbing cyanosis or edema  Lab Results: Basic Metabolic Panel:  Lab 10/16/11 2956 10/15/11 0445  NA 143 141  K 4.2 4.3  CL 105 103  CO2 32 35*  GLUCOSE 138* 124*  BUN 32* 39*  CREATININE 1.34 1.62*  CALCIUM 9.7 9.6  MG -- --  PHOS -- --   Liver Function Tests:  Lab 10/15/11 0445 10/12/11 0958  AST 59* 52*  ALT 43 27  ALKPHOS 54 63  BILITOT 0.3 0.6  PROT 6.6 8.6*  ALBUMIN 2.9* 3.9   No results found for this basename: LIPASE:2,AMYLASE:2 in the last 168 hours No results found for this basename: AMMONIA:2 in the last 168 hours CBC:  Lab 10/15/11 0445 10/13/11 0426 10/12/11 0958  WBC 8.8 10.2 --  NEUTROABS -- -- 7.2  HGB 10.5* 11.9* --  HCT 33.7* 37.9* --  MCV 94.4 92.9 --  PLT 178 182 --   Cardiac Enzymes:  Lab 10/17/11 0502 10/16/11 0533 10/15/11 0445 10/14/11 0657 10/13/11 0426 10/12/11 1526  CKTOTAL 360* 551* 1318* -- -- --  CKMB 4.8* 7.6* 18.4* -- -- --  CKMBINDEX -- -- -- -- -- --  TROPONINI -- -- -- <0.30 <0.30 <0.30   BNP:  Lab 10/16/11 1043 10/14/11 0657 10/12/11 0958  PROBNP 226.5 321.9 408.7   D-Dimer:  Lab 10/12/11 0958  DDIMER 0.87*   CBG:  Lab 10/17/11 0358 10/17/11 0021 10/16/11 2149 10/16/11 1637 10/16/11 1217 10/16/11 0815    GLUCAP 151* 141* 112* 132* 138* 124*   Hemoglobin A1C: No results found for this basename: HGBA1C in the last 168 hours Fasting Lipid Panel:  Lab 10/17/11 0502  CHOL --  HDL --  LDLCALC --  TRIG 150*  CHOLHDL --  LDLDIRECT --   Thyroid Function Tests:  Lab 10/12/11 0958  TSH 0.543  T4TOTAL --  FREET4 1.20  T3FREE --  THYROIDAB --   Coagulation: No results found for this basename: LABPROT:4,INR:4 in the last 168 hours Anemia Panel:  Lab 10/13/11 0426  VITAMINB12 680  FOLATE --  FERRITIN 140  TIBC 235  IRON 26*  RETICCTPCT --   Urine Drug Screen: Drugs of Abuse  No results found for this basename: labopia,  cocainscrnur,  labbenz,  amphetmu,  thcu,  labbarb    Alcohol Level: No results found for this basename: ETH:2 in the last 168 hours Urinalysis:  Lab 10/12/11 1019  COLORURINE YELLOW  LABSPEC >1.030*  PHURINE 6.0  GLUCOSEU NEGATIVE  HGBUR LARGE*  BILIRUBINUR NEGATIVE  KETONESUR NEGATIVE  PROTEINUR 100*  UROBILINOGEN 0.2  NITRITE NEGATIVE  LEUKOCYTESUR NEGATIVE   Micro Results: Recent Results (from the past 240 hour(s))  CULTURE, BLOOD (ROUTINE X 2)     Status: Normal (Preliminary result)   Collection Time   10/12/11  9:59 AM      Component Value Range Status Comment   Specimen Description BLOOD LEFT WRIST   Final    Special Requests BOTTLES DRAWN AEROBIC AND ANAEROBIC 8CC   Final    Culture NO GROWTH 4 DAYS   Final    Report Status PENDING   Incomplete   CULTURE, BLOOD (ROUTINE X 2)     Status: Normal (Preliminary result)   Collection Time   10/12/11 10:03 AM      Component Value Range Status Comment   Specimen Description BLOOD RIGHT HAND   Final    Special Requests BOTTLES DRAWN AEROBIC AND ANAEROBIC 8CC   Final    Culture NO GROWTH 4 DAYS   Final    Report Status PENDING   Incomplete   URINE CULTURE     Status: Normal   Collection Time   10/12/11 10:19 AM      Component Value Range Status Comment   Specimen Description URINE, CLEAN CATCH    Final    Special Requests NONE   Final    Culture  Setup Time 10/12/2011 19:39   Final    Colony Count NO GROWTH   Final    Culture NO GROWTH   Final    Report Status 10/14/2011 FINAL   Final   MRSA PCR SCREENING     Status: Normal   Collection Time   10/12/11  3:21 PM      Component Value Range Status Comment   MRSA by PCR NEGATIVE  NEGATIVE Final    Studies/Results: Dg Chest Port 1 View  10/17/2011  *RADIOLOGY REPORT*  Clinical Data: Respiratory failure.  PORTABLE CHEST - 1 VIEW  Comparison: 10/16/2011.  Findings: Endotracheal tube and nasogastric tube remain present. The nasogastric tube is doubled upon itself in the gastric fundus. Monitoring leads are projected over the chest.  There is no airspace disease.  No effusion is present.  Cardiopericardial silhouette is within normal limits.  IMPRESSION: 1.  Stable support apparatus. 2.  No change in pulmonary aeration. 3.  Prominence of the pulmonary arteries suggesting pulmonary arterial hypertension. 3.  Prominence of the pulmonary arteries suggesting pulmonary arterial hypertension.  Original Report Authenticated By: Andreas Newport, M.D.   Dg Chest Port 1 View  10/16/2011  *RADIOLOGY REPORT*  Clinical Data: Respiratory failure  PORTABLE CHEST - 1 VIEW  Comparison: Portable chest x-ray of 10/15/2011  Findings: There is little change in aeration.  The endotracheal tube and right PICC line are unchanged in position and cardiomegaly is stable.  IMPRESSION: No significant change in aeration and placement of PICC line.  Original Report Authenticated By: Juline Patch, M.D.   Dg Chest Port 1v Same Day  10/16/2011  *RADIOLOGY REPORT*  Clinical Data: Evaluate endotracheal tube placement.  PORTABLE CHEST - 1 VIEW SAME DAY  Comparison: 10/16/2011  Findings: Endotracheal tube is approximate 4.1 cm above the carina. Patient is rotated towards the right on this examination. Nasogastric tube extends into the stomach region.  The lungs are clear without focal  disease or edema.  Heart size is grossly stable. PICC line may have been pulled back and the tip is now in the upper SVC region.  IMPRESSION: Endotracheal tube appears to be appropriately positioned above the carina.  No focal chest disease.  Original Report Authenticated By: Richarda Overlie, M.D.   Dg Chest Port 1v  Same Day  10/15/2011  *RADIOLOGY REPORT*  Clinical Data: PICC placement.  PORTABLE CHEST - 1 VIEW SAME DAY  Comparison: Chest 10/15/2011 at 6:35 a.m.  Findings: Right PICC is in place with the tip in the lower superior vena cava.  Support apparatus is otherwise unchanged.  Aeration is unchanged.  Heart size normal.  IMPRESSION: PICC in the lower superior vena cava.  No other change.  Original Report Authenticated By: Bernadene Bell. D'ALESSIO, M.D.   Echocardiogram:  - Procedure narrative: Transthoracic echocardiography. Image quality was suboptimal with incomplete image acquisition. - Left ventricle: The cavity size was normal. There was moderate concentric hypertrophy. Systolic function was vigorous. The estimated ejection fraction was in the range of 65% to 70%. Wall motion was normal; there were no regional wall motion abnormalities. Doppler parameters are consistent with abnormal left ventricular relaxation (grade 1 diastolic dysfunction). - Aortic valve: Sclerosis without stenosis. - Left atrium: The atrium was at the upper limits of normal in size.   Scheduled Meds:    . antiseptic oral rinse  15 mL Mouth Rinse QID  . azithromycin  500 mg Intravenous Q24H  . cefTRIAXone (ROCEPHIN)  IV  1 g Intravenous Q24H  . chlorhexidine  15 mL Mouth Rinse BID  . enoxaparin (LOVENOX) injection  40 mg Subcutaneous Q24H  . insulin aspart  0-9 Units Subcutaneous Q4H  . insulin glargine  10 Units Subcutaneous QHS  . ipratropium  0.5 mg Nebulization Q4H  . levalbuterol  0.63 mg Nebulization Q4H  . methylPREDNISolone (SOLU-MEDROL) injection  80 mg Intravenous Q6H  . pantoprazole sodium  40 mg Per  Tube Q1200  . sodium chloride  10-40 mL Intracatheter Q12H  . DISCONTD: insulin aspart  0-20 Units Subcutaneous TID WC  . DISCONTD: insulin aspart  0-5 Units Subcutaneous QHS  . DISCONTD: pantoprazole (PROTONIX) IV  40 mg Intravenous Q24H   Continuous Infusions:    . dextrose 5 % and 0.45 % NaCl with KCl 20 mEq/L 20 mL/hr at 10/17/11 0600  . feeding supplement (JEVITY 1.2 CAL) 20 mL/hr at 10/16/11 1313  . propofol 8.846 mcg/kg/min (10/17/11 0643)   PRN Meds:.acetaminophen, acetaminophen, alum & mag hydroxide-simeth, hydrALAZINE, levalbuterol, ondansetron (ZOFRAN) IV, ondansetron, sodium chloride, DISCONTD: nabumetone Assessment/Plan: Principal Problem:  *Acute respiratory failure with hypercapnia Active Problems:  COPD with acute exacerbation  PNA (pneumonia)  Anemia  Hypertension  Hyperglycemia, drug-induced  Acute renal failure  Tachycardia  Right Axis Deviation  Chronic respiratory failure with hypoxia  Chronic back pain  Rhabdomyolysis  Encephalopathy acute  Tolerating tube feeds. Continue antibiotics, steroids, bronchodilators. CPK almost near normal. Vent per pulmonary.   LOS: 5 days   Darril Patriarca L 10/17/2011, 8:24 AM

## 2011-10-17 NOTE — Progress Notes (Signed)
Notified MD regarding every 4 hour CBG checks since patient is on tube feeding. MD said to put order in for every 4 hours CBG check and sensitive scale.

## 2011-10-17 NOTE — Progress Notes (Signed)
Subjective: He is he's more alert. He remains intubated on the ventilator. Yesterday his blood pressure went up when weaning was attempted  Objective: Vital signs in last 24 hours: Temp:  [98.5 F (36.9 C)-99.9 F (37.7 C)] 98.5 F (36.9 C) (07/12 0400) Pulse Rate:  [73-110] 95  (07/12 0600) Resp:  [13-23] 17  (07/12 0600) BP: (126-174)/(76-96) 149/79 mmHg (07/12 0600) SpO2:  [97 %-100 %] 100 % (07/12 0600) FiO2 (%):  [39.4 %-40.5 %] 39.8 % (07/12 0600) Weight:  [98.7 kg (217 lb 9.5 oz)] 98.7 kg (217 lb 9.5 oz) (07/12 0500) Weight change: -1 kg (-2 lb 3.3 oz) Last BM Date: 10/14/11  Intake/Output from previous day: 07/11 0701 - 07/12 0700 In: 1529.7 [I.V.:1099.7; NG/GT:430] Out: 2050 [Urine:1800; Emesis/NG output:250]  PHYSICAL EXAM General appearance: alert, mild distress and Intubated Resp: rhonchi bilaterally Cardio: regular rate and rhythm, S1, S2 normal, no murmur, click, rub or gallop GI: soft, non-tender; bowel sounds normal; no masses,  no organomegaly Extremities: extremities normal, atraumatic, no cyanosis or edema  Lab Results:    Basic Metabolic Panel:  Basename 10/16/11 0533 10/15/11 0445  NA 143 141  K 4.2 4.3  CL 105 103  CO2 32 35*  GLUCOSE 138* 124*  BUN 32* 39*  CREATININE 1.34 1.62*  CALCIUM 9.7 9.6  MG -- --  PHOS -- --   Liver Function Tests:  Basename 10/15/11 0445  AST 59*  ALT 43  ALKPHOS 54  BILITOT 0.3  PROT 6.6  ALBUMIN 2.9*   No results found for this basename: LIPASE:2,AMYLASE:2 in the last 72 hours No results found for this basename: AMMONIA:2 in the last 72 hours CBC:  Basename 10/15/11 0445  WBC 8.8  NEUTROABS --  HGB 10.5*  HCT 33.7*  MCV 94.4  PLT 178   Cardiac Enzymes:  Basename 10/17/11 0502 10/16/11 0533 10/15/11 0445  CKTOTAL 360* 551* 1318*  CKMB 4.8* 7.6* 18.4*  CKMBINDEX -- -- --  TROPONINI -- -- --   BNP:  Basename 10/16/11 1043  PROBNP 226.5   D-Dimer: No results found for this basename:  DDIMER:2 in the last 72 hours CBG:  Basename 10/17/11 0358 10/17/11 0021 10/16/11 2149 10/16/11 1637 10/16/11 1217 10/16/11 0815  GLUCAP 151* 141* 112* 132* 138* 124*   Hemoglobin A1C: No results found for this basename: HGBA1C in the last 72 hours Fasting Lipid Panel:  Basename 10/17/11 0502  CHOL --  HDL --  LDLCALC --  TRIG 150*  CHOLHDL --  LDLDIRECT --   Thyroid Function Tests: No results found for this basename: TSH,T4TOTAL,FREET4,T3FREE,THYROIDAB in the last 72 hours Anemia Panel: No results found for this basename: VITAMINB12,FOLATE,FERRITIN,TIBC,IRON,RETICCTPCT in the last 72 hours Coagulation: No results found for this basename: LABPROT:2,INR:2 in the last 72 hours Urine Drug Screen: Drugs of Abuse  No results found for this basename: labopia, cocainscrnur, labbenz, amphetmu, thcu, labbarb    Alcohol Level: No results found for this basename: ETH:2 in the last 72 hours Urinalysis: No results found for this basename: COLORURINE:2,APPERANCEUR:2,LABSPEC:2,PHURINE:2,GLUCOSEU:2,HGBUR:2,BILIRUBINUR:2,KETONESUR:2,PROTEINUR:2,UROBILINOGEN:2,NITRITE:2,LEUKOCYTESUR:2 in the last 72 hours Misc. Labs:  ABGS  Basename 10/17/11 0409  PHART 7.454*  PO2ART 155.0*  TCO2 PENDING  HCO3 32.3*   CULTURES Recent Results (from the past 240 hour(s))  CULTURE, BLOOD (ROUTINE X 2)     Status: Normal (Preliminary result)   Collection Time   10/12/11  9:59 AM      Component Value Range Status Comment   Specimen Description BLOOD LEFT WRIST   Final  Special Requests BOTTLES DRAWN AEROBIC AND ANAEROBIC 8CC   Final    Culture NO GROWTH 4 DAYS   Final    Report Status PENDING   Incomplete   CULTURE, BLOOD (ROUTINE X 2)     Status: Normal (Preliminary result)   Collection Time   10/12/11 10:03 AM      Component Value Range Status Comment   Specimen Description BLOOD RIGHT HAND   Final    Special Requests BOTTLES DRAWN AEROBIC AND ANAEROBIC 8CC   Final    Culture NO GROWTH 4 DAYS    Final    Report Status PENDING   Incomplete   URINE CULTURE     Status: Normal   Collection Time   10/12/11 10:19 AM      Component Value Range Status Comment   Specimen Description URINE, CLEAN CATCH   Final    Special Requests NONE   Final    Culture  Setup Time 10/12/2011 19:39   Final    Colony Count NO GROWTH   Final    Culture NO GROWTH   Final    Report Status 10/14/2011 FINAL   Final   MRSA PCR SCREENING     Status: Normal   Collection Time   10/12/11  3:21 PM      Component Value Range Status Comment   MRSA by PCR NEGATIVE  NEGATIVE Final    Studies/Results: Dg Chest Port 1 View  10/16/2011  *RADIOLOGY REPORT*  Clinical Data: Respiratory failure  PORTABLE CHEST - 1 VIEW  Comparison: Portable chest x-ray of 10/15/2011  Findings: There is little change in aeration.  The endotracheal tube and right PICC line are unchanged in position and cardiomegaly is stable.  IMPRESSION: No significant change in aeration and placement of PICC line.  Original Report Authenticated By: Juline Patch, M.D.   Dg Chest Port 1v Same Day  10/16/2011  *RADIOLOGY REPORT*  Clinical Data: Evaluate endotracheal tube placement.  PORTABLE CHEST - 1 VIEW SAME DAY  Comparison: 10/16/2011  Findings: Endotracheal tube is approximate 4.1 cm above the carina. Patient is rotated towards the right on this examination. Nasogastric tube extends into the stomach region.  The lungs are clear without focal disease or edema.  Heart size is grossly stable. PICC line may have been pulled back and the tip is now in the upper SVC region.  IMPRESSION: Endotracheal tube appears to be appropriately positioned above the carina.  No focal chest disease.  Original Report Authenticated By: Richarda Overlie, M.D.   Dg Chest Port 1v Same Day  10/15/2011  *RADIOLOGY REPORT*  Clinical Data: PICC placement.  PORTABLE CHEST - 1 VIEW SAME DAY  Comparison: Chest 10/15/2011 at 6:35 a.m.  Findings: Right PICC is in place with the tip in the lower superior  vena cava.  Support apparatus is otherwise unchanged.  Aeration is unchanged.  Heart size normal.  IMPRESSION: PICC in the lower superior vena cava.  No other change.  Original Report Authenticated By: Bernadene Bell. Maricela Curet, M.D.    Medications:  Scheduled:   . antiseptic oral rinse  15 mL Mouth Rinse QID  . azithromycin  500 mg Intravenous Q24H  . cefTRIAXone (ROCEPHIN)  IV  1 g Intravenous Q24H  . chlorhexidine  15 mL Mouth Rinse BID  . enoxaparin (LOVENOX) injection  40 mg Subcutaneous Q24H  . insulin aspart  0-9 Units Subcutaneous Q4H  . insulin glargine  10 Units Subcutaneous QHS  . ipratropium  0.5 mg Nebulization Q4H  .  levalbuterol  0.63 mg Nebulization Q4H  . methylPREDNISolone (SOLU-MEDROL) injection  80 mg Intravenous Q6H  . pantoprazole sodium  40 mg Per Tube Q1200  . sodium chloride  10-40 mL Intracatheter Q12H  . DISCONTD: insulin aspart  0-20 Units Subcutaneous TID WC  . DISCONTD: insulin aspart  0-5 Units Subcutaneous QHS  . DISCONTD: pantoprazole (PROTONIX) IV  40 mg Intravenous Q24H   Continuous:   . dextrose 5 % and 0.45 % NaCl with KCl 20 mEq/L 20 mL/hr at 10/17/11 0600  . feeding supplement (JEVITY 1.2 CAL) 20 mL/hr at 10/16/11 1313  . propofol 8.846 mcg/kg/min (10/17/11 1610)   RUE:[id number], acetaminophen, alum & mag hydroxide-simeth, hydrALAZINE, levalbuterol, ondansetron (ZOFRAN) IV, ondansetron, sodium chloride, DISCONTD: nabumetone  Assesment: He has acute respiratory failure. He remains intubated. He seems better. He has had problems with hypertension S. he has weaned. I am hopeful that he will be able to be extubated later today. He has when necessary medications for hypertension ordered. Principal Problem:  *Acute respiratory failure with hypercapnia Active Problems:  COPD with acute exacerbation  Tachycardia  Right Axis Deviation  Anemia  Chronic respiratory failure with hypoxia  Chronic back pain  PNA (pneumonia)  Hypertension   Rhabdomyolysis  Hyperglycemia, drug-induced  Encephalopathy acute  Acute renal failure    Plan: Continue his treatments attempt weaning and possible extubation today    LOS: 5 days   Jarad Barth L 10/17/2011, 8:01 AM

## 2011-10-18 LAB — GLUCOSE, CAPILLARY
Glucose-Capillary: 142 mg/dL — ABNORMAL HIGH (ref 70–99)
Glucose-Capillary: 145 mg/dL — ABNORMAL HIGH (ref 70–99)
Glucose-Capillary: 134 mg/dL — ABNORMAL HIGH (ref 70–99)

## 2011-10-18 LAB — CBC
HCT: 35.1 % — ABNORMAL LOW (ref 39.0–52.0)
Hemoglobin: 11.2 g/dL — ABNORMAL LOW (ref 13.0–17.0)
MCHC: 31.9 g/dL (ref 30.0–36.0)
MCV: 92.9 fL (ref 78.0–100.0)
RDW: 14.3 % (ref 11.5–15.5)

## 2011-10-18 LAB — BLOOD GAS, ARTERIAL
PEEP: 5 cmH2O
RATE: 14 resp/min
pCO2 arterial: 55 mmHg — ABNORMAL HIGH (ref 35.0–45.0)
pO2, Arterial: 104 mmHg — ABNORMAL HIGH (ref 80.0–100.0)

## 2011-10-18 LAB — BASIC METABOLIC PANEL
BUN: 40 mg/dL — ABNORMAL HIGH (ref 6–23)
Chloride: 106 mEq/L (ref 96–112)
Glucose, Bld: 155 mg/dL — ABNORMAL HIGH (ref 70–99)
Potassium: 4.3 mEq/L (ref 3.5–5.1)

## 2011-10-18 MED ORDER — LORAZEPAM 2 MG/ML IJ SOLN
INTRAMUSCULAR | Status: AC
  Administered 2011-10-18: 1 mg via INTRAVENOUS
  Filled 2011-10-18: qty 1

## 2011-10-18 MED ORDER — LORAZEPAM 2 MG/ML IJ SOLN
1.0000 mg | INTRAMUSCULAR | Status: DC | PRN
  Administered 2011-10-18 – 2011-10-21 (×5): 1 mg via INTRAVENOUS
  Filled 2011-10-18 (×4): qty 1

## 2011-10-18 NOTE — Progress Notes (Addendum)
Discussed with Dr. Juanetta Gosling.  Objective: Vital signs in last 24 hours: Filed Vitals:   10/18/11 1000 10/18/11 1100 10/18/11 1101 10/18/11 1200  BP: 161/82 162/86  124/71  Pulse: 95 98  86  Temp:    99 F (37.2 C)  TempSrc:    Oral  Resp: 26 23  16   Height:      Weight:      SpO2: 99% 97% 98% 98%   Weight change: -0.4 kg (-14.1 oz)  Intake/Output Summary (Last 24 hours) at 10/18/11 1447 Last data filed at 10/18/11 1222  Gross per 24 hour  Intake 2526.29 ml  Output   1100 ml  Net 1426.29 ml   General: intubated. Eyes closed. Lungs:  diminished throughout without wheezes rhonchi or rales Cardiovascular, regular rate rhythm without murmurs gallops rubs Abdomen soft nontender nondistended Extremities no clubbing cyanosis or edema  Lab Results: Basic Metabolic Panel:  Lab 10/18/11 1610 10/16/11 0533  NA 144 143  K 4.3 4.2  CL 106 105  CO2 34* 32  GLUCOSE 155* 138*  BUN 40* 32*  CREATININE 1.33 1.34  CALCIUM 9.3 9.7  MG -- --  PHOS -- --   Liver Function Tests:  Lab 10/15/11 0445 10/12/11 0958  AST 59* 52*  ALT 43 27  ALKPHOS 54 63  BILITOT 0.3 0.6  PROT 6.6 8.6*  ALBUMIN 2.9* 3.9   No results found for this basename: LIPASE:2,AMYLASE:2 in the last 168 hours No results found for this basename: AMMONIA:2 in the last 168 hours CBC:  Lab 10/18/11 0515 10/15/11 0445 10/12/11 0958  WBC 10.9* 8.8 --  NEUTROABS -- -- 7.2  HGB 11.2* 10.5* --  HCT 35.1* 33.7* --  MCV 92.9 94.4 --  PLT 158 178 --   Cardiac Enzymes:  Lab 10/17/11 0502 10/16/11 0533 10/15/11 0445 10/14/11 0657 10/13/11 0426 10/12/11 1526  CKTOTAL 360* 551* 1318* -- -- --  CKMB 4.8* 7.6* 18.4* -- -- --  CKMBINDEX -- -- -- -- -- --  TROPONINI -- -- -- <0.30 <0.30 <0.30   BNP:  Lab 10/16/11 1043 10/14/11 0657 10/12/11 0958  PROBNP 226.5 321.9 408.7   D-Dimer:  Lab 10/12/11 0958  DDIMER 0.87*   CBG:  Lab 10/18/11 1133 10/18/11 0749 10/18/11 0341 10/17/11 2308 10/17/11 1957 10/17/11 1625   GLUCAP 114* 153* 142* 118* 100* 215*   Hemoglobin A1C: No results found for this basename: HGBA1C in the last 168 hours Fasting Lipid Panel:  Lab 10/17/11 0502  CHOL --  HDL --  LDLCALC --  TRIG 150*  CHOLHDL --  LDLDIRECT --   Thyroid Function Tests:  Lab 10/12/11 0958  TSH 0.543  T4TOTAL --  FREET4 1.20  T3FREE --  THYROIDAB --   Coagulation: No results found for this basename: LABPROT:4,INR:4 in the last 168 hours Anemia Panel:  Lab 10/13/11 0426  VITAMINB12 680  FOLATE --  FERRITIN 140  TIBC 235  IRON 26*  RETICCTPCT --   Urine Drug Screen: Drugs of Abuse  No results found for this basename: labopia,  cocainscrnur,  labbenz,  amphetmu,  thcu,  labbarb    Alcohol Level: No results found for this basename: ETH:2 in the last 168 hours Urinalysis:  Lab 10/12/11 1019  COLORURINE YELLOW  LABSPEC >1.030*  PHURINE 6.0  GLUCOSEU NEGATIVE  HGBUR LARGE*  BILIRUBINUR NEGATIVE  KETONESUR NEGATIVE  PROTEINUR 100*  UROBILINOGEN 0.2  NITRITE NEGATIVE  LEUKOCYTESUR NEGATIVE   Micro Results: Recent Results (from the past 240 hour(s))  CULTURE, BLOOD (ROUTINE X 2)     Status: Normal   Collection Time   10/12/11  9:59 AM      Component Value Range Status Comment   Specimen Description BLOOD LEFT WRIST   Final    Special Requests BOTTLES DRAWN AEROBIC AND ANAEROBIC 8CC   Final    Culture NO GROWTH 5 DAYS   Final    Report Status 10/17/2011 FINAL   Final   CULTURE, BLOOD (ROUTINE X 2)     Status: Normal   Collection Time   10/12/11 10:03 AM      Component Value Range Status Comment   Specimen Description BLOOD RIGHT HAND   Final    Special Requests BOTTLES DRAWN AEROBIC AND ANAEROBIC 8CC   Final    Culture NO GROWTH 5 DAYS   Final    Report Status 10/17/2011 FINAL   Final   URINE CULTURE     Status: Normal   Collection Time   10/12/11 10:19 AM      Component Value Range Status Comment   Specimen Description URINE, CLEAN CATCH   Final    Special Requests NONE    Final    Culture  Setup Time 10/12/2011 19:39   Final    Colony Count NO GROWTH   Final    Culture NO GROWTH   Final    Report Status 10/14/2011 FINAL   Final   MRSA PCR SCREENING     Status: Normal   Collection Time   10/12/11  3:21 PM      Component Value Range Status Comment   MRSA by PCR NEGATIVE  NEGATIVE Final    Studies/Results: Dg Chest Port 1 View  10/17/2011  *RADIOLOGY REPORT*  Clinical Data: Respiratory failure.  PORTABLE CHEST - 1 VIEW  Comparison: 10/16/2011.  Findings: Endotracheal tube and nasogastric tube remain present. The nasogastric tube is doubled upon itself in the gastric fundus. Monitoring leads are projected over the chest.  There is no airspace disease.  No effusion is present.  Cardiopericardial silhouette is within normal limits.  IMPRESSION: 1.  Stable support apparatus. 2.  No change in pulmonary aeration. 3.  Prominence of the pulmonary arteries suggesting pulmonary arterial hypertension. 3.  Prominence of the pulmonary arteries suggesting pulmonary arterial hypertension.  Original Report Authenticated By: Andreas Newport, M.D.   Dg Chest Port 1v Same Day  10/16/2011  *RADIOLOGY REPORT*  Clinical Data: Evaluate endotracheal tube placement.  PORTABLE CHEST - 1 VIEW SAME DAY  Comparison: 10/16/2011  Findings: Endotracheal tube is approximate 4.1 cm above the carina. Patient is rotated towards the right on this examination. Nasogastric tube extends into the stomach region.  The lungs are clear without focal disease or edema.  Heart size is grossly stable. PICC line may have been pulled back and the tip is now in the upper SVC region.  IMPRESSION: Endotracheal tube appears to be appropriately positioned above the carina.  No focal chest disease.  Original Report Authenticated By: Richarda Overlie, M.D.   Echocardiogram:  - Procedure narrative: Transthoracic echocardiography. Image quality was suboptimal with incomplete image acquisition. - Left ventricle: The cavity size was  normal. There was moderate concentric hypertrophy. Systolic function was vigorous. The estimated ejection fraction was in the range of 65% to 70%. Wall motion was normal; there were no regional wall motion abnormalities. Doppler parameters are consistent with abnormal left ventricular relaxation (grade 1 diastolic dysfunction). - Aortic valve: Sclerosis without stenosis. - Left atrium: The atrium was at  the upper limits of normal in size.   Scheduled Meds:    . antiseptic oral rinse  15 mL Mouth Rinse QID  . cefTRIAXone (ROCEPHIN)  IV  1 g Intravenous Q24H  . chlorhexidine  15 mL Mouth Rinse BID  . enoxaparin (LOVENOX) injection  40 mg Subcutaneous Q24H  . insulin aspart  0-9 Units Subcutaneous Q4H  . insulin glargine  5 Units Subcutaneous QHS  . ipratropium  0.5 mg Nebulization Q4H  . levalbuterol  0.63 mg Nebulization Q4H  . methylPREDNISolone (SOLU-MEDROL) injection  80 mg Intravenous Q12H  . pantoprazole sodium  40 mg Per Tube Q1200  . sodium chloride  10-40 mL Intracatheter Q12H  . DISCONTD: azithromycin  500 mg Intravenous Q24H   Continuous Infusions:    . dextrose 5 % and 0.45 % NaCl with KCl 20 mEq/L 20 mL/hr at 10/18/11 1200  . feeding supplement (JEVITY 1.2 CAL) 20 mL/hr at 10/16/11 1313  . propofol 25 mcg/kg/min (10/18/11 1200)   PRN Meds:.acetaminophen, acetaminophen, alum & mag hydroxide-simeth, hydrALAZINE, levalbuterol, LORazepam, ondansetron (ZOFRAN) IV, ondansetron, sodium chloride Assessment/Plan: Principal Problem:  *Acute respiratory failure with hypercapnia Active Problems:  COPD with acute exacerbation  PNA (pneumonia)  Anemia  Hypertension  Hyperglycemia, drug-induced  Acute renal failure  Tachycardia  Right Axis Deviation  Chronic respiratory failure with hypoxia  Chronic back pain  Rhabdomyolysis  Encephalopathy acute  Tolerating tube feeds. Continue antibiotics, steroids, bronchodilators. Vent per pulmonary.   LOS: 6 days    Thierno Hun L 10/18/2011, 2:47 PM

## 2011-10-18 NOTE — Progress Notes (Signed)
Resp There: Patient evaluated; treatments given; suctioned.  Patient placed on SBT (5PS/5Peep); tolerated approx 2hours but exhibited fluctuations in tidal volumes, rrate, agitation and slowing escalating hr and bp.  Patient not extubated due to inconsistent reactions to SBT.  Patient also remained lethargic off of sedation during weaning trial.  Changed HME, [place] suction and adjusted ETT.  Patient remains on vent with original settings.   Charlott Holler RRT

## 2011-10-18 NOTE — Plan of Care (Signed)
Problem: Consults Goal: Diabetes Guidelines if Diabetic/Glucose > 140 If diabetic or lab glucose is > 140 mg/dl - Initiate Diabetes/Hyperglycemia Guidelines & Document Interventions  Outcome: Completed/Met Date Met:  10/18/11 Pt is not a diabetic but on tube feeding and on steroids

## 2011-10-18 NOTE — Progress Notes (Signed)
Subjective: He failed weaning yesterday and it seemed to be related to anxiety although he denied being anxious. He looks very calm but is still sedated. His chest is clear  Objective: Vital signs in last 24 hours: Temp:  [97.7 F (36.5 C)-99.4 F (37.4 C)] 98.1 F (36.7 C) (07/13 0400) Pulse Rate:  [73-131] 95  (07/13 0645) Resp:  [8-36] 16  (07/13 0645) BP: (90-163)/(53-124) 150/76 mmHg (07/13 0645) SpO2:  [92 %-100 %] 99 % (07/13 0814) FiO2 (%):  [31.7 %-40.6 %] 32 % (07/13 0814) Weight:  [98.3 kg (216 lb 11.4 oz)] 98.3 kg (216 lb 11.4 oz) (07/13 0400) Weight change: -0.4 kg (-14.1 oz) Last BM Date: 10/18/11  Intake/Output from previous day: 07/12 0701 - 07/13 0700 In: 2761.1 [I.V.:851.1; NG/GT:1260; IV Piggyback:650] Out: 1100 [Urine:1100] Total I/O In: 60 [NG/GT:60] Out: -    Physical Exam: @PNPE @   Lab Results: Basic Metabolic Panel:  Basename 10/18/11 0515 10/16/11 0533  NA 144 143  K 4.3 4.2  CL 106 105  CO2 34* 32  GLUCOSE 155* 138*  BUN 40* 32*  CREATININE 1.33 1.34  CALCIUM 9.3 9.7  MG -- --  PHOS -- --   Liver Function Tests: No results found for this basename: AST:2,ALT:2,ALKPHOS:2,BILITOT:2,PROT:2,ALBUMIN:2 in the last 72 hours No results found for this basename: LIPASE:2,AMYLASE:2 in the last 72 hours No results found for this basename: AMMONIA:2 in the last 72 hours CBC:  Basename 10/18/11 0515  WBC 10.9*  NEUTROABS --  HGB 11.2*  HCT 35.1*  MCV 92.9  PLT 158   Cardiac Enzymes:  Basename 10/17/11 0502 10/16/11 0533  CKTOTAL 360* 551*  CKMB 4.8* 7.6*  CKMBINDEX -- --  TROPONINI -- --   BNP:  Basename 10/16/11 1043  PROBNP 226.5   D-Dimer: No results found for this basename: DDIMER:2 in the last 72 hours CBG:  Basename 10/18/11 0749 10/18/11 0341 10/17/11 2308 10/17/11 1957 10/17/11 1625 10/17/11 1145  GLUCAP 153* 142* 118* 100* 215* 175*   Hemoglobin A1C: No results found for this basename: HGBA1C in the last 72  hours Fasting Lipid Panel:  Basename 10/17/11 0502  CHOL --  HDL --  LDLCALC --  TRIG 150*  CHOLHDL --  LDLDIRECT --   Thyroid Function Tests: No results found for this basename: TSH,T4TOTAL,FREET4,T3FREE,THYROIDAB in the last 72 hours Anemia Panel: No results found for this basename: VITAMINB12,FOLATE,FERRITIN,TIBC,IRON,RETICCTPCT in the last 72 hours Coagulation: No results found for this basename: LABPROT:2,INR:2 in the last 72 hours Urine Drug Screen: Drugs of Abuse  No results found for this basename: labopia, cocainscrnur, labbenz, amphetmu, thcu, labbarb    Alcohol Level: No results found for this basename: ETH:2 in the last 72 hours Urinalysis: No results found for this basename: COLORURINE:2,APPERANCEUR:2,LABSPEC:2,PHURINE:2,GLUCOSEU:2,HGBUR:2,BILIRUBINUR:2,KETONESUR:2,PROTEINUR:2,UROBILINOGEN:2,NITRITE:2,LEUKOCYTESUR:2 in the last 72 hours Misc. Labs:  Recent Results (from the past 240 hour(s))  CULTURE, BLOOD (ROUTINE X 2)     Status: Normal   Collection Time   10/12/11  9:59 AM      Component Value Range Status Comment   Specimen Description BLOOD LEFT WRIST   Final    Special Requests BOTTLES DRAWN AEROBIC AND ANAEROBIC 8CC   Final    Culture NO GROWTH 5 DAYS   Final    Report Status 10/17/2011 FINAL   Final   CULTURE, BLOOD (ROUTINE X 2)     Status: Normal   Collection Time   10/12/11 10:03 AM      Component Value Range Status Comment   Specimen Description  BLOOD RIGHT HAND   Final    Special Requests BOTTLES DRAWN AEROBIC AND ANAEROBIC 8CC   Final    Culture NO GROWTH 5 DAYS   Final    Report Status 10/17/2011 FINAL   Final   URINE CULTURE     Status: Normal   Collection Time   10/12/11 10:19 AM      Component Value Range Status Comment   Specimen Description URINE, CLEAN CATCH   Final    Special Requests NONE   Final    Culture  Setup Time 10/12/2011 19:39   Final    Colony Count NO GROWTH   Final    Culture NO GROWTH   Final    Report Status 10/14/2011  FINAL   Final   MRSA PCR SCREENING     Status: Normal   Collection Time   10/12/11  3:21 PM      Component Value Range Status Comment   MRSA by PCR NEGATIVE  NEGATIVE Final     Studies/Results: Dg Chest Port 1 View  10/17/2011  *RADIOLOGY REPORT*  Clinical Data: Respiratory failure.  PORTABLE CHEST - 1 VIEW  Comparison: 10/16/2011.  Findings: Endotracheal tube and nasogastric tube remain present. The nasogastric tube is doubled upon itself in the gastric fundus. Monitoring leads are projected over the chest.  There is no airspace disease.  No effusion is present.  Cardiopericardial silhouette is within normal limits.  IMPRESSION: 1.  Stable support apparatus. 2.  No change in pulmonary aeration. 3.  Prominence of the pulmonary arteries suggesting pulmonary arterial hypertension. 3.  Prominence of the pulmonary arteries suggesting pulmonary arterial hypertension.  Original Report Authenticated By: Andreas Newport, M.D.   Dg Chest Port 1v Same Day  10/16/2011  *RADIOLOGY REPORT*  Clinical Data: Evaluate endotracheal tube placement.  PORTABLE CHEST - 1 VIEW SAME DAY  Comparison: 10/16/2011  Findings: Endotracheal tube is approximate 4.1 cm above the carina. Patient is rotated towards the right on this examination. Nasogastric tube extends into the stomach region.  The lungs are clear without focal disease or edema.  Heart size is grossly stable. PICC line may have been pulled back and the tip is now in the upper SVC region.  IMPRESSION: Endotracheal tube appears to be appropriately positioned above the carina.  No focal chest disease.  Original Report Authenticated By: Richarda Overlie, M.D.    Medications: Scheduled Meds:   . antiseptic oral rinse  15 mL Mouth Rinse QID  . azithromycin  500 mg Intravenous Q24H  . cefTRIAXone (ROCEPHIN)  IV  1 g Intravenous Q24H  . chlorhexidine  15 mL Mouth Rinse BID  . enoxaparin (LOVENOX) injection  40 mg Subcutaneous Q24H  . insulin aspart  0-9 Units Subcutaneous Q4H   . insulin glargine  5 Units Subcutaneous QHS  . ipratropium  0.5 mg Nebulization Q4H  . levalbuterol  0.63 mg Nebulization Q4H  . methylPREDNISolone (SOLU-MEDROL) injection  80 mg Intravenous Q12H  . pantoprazole sodium  40 mg Per Tube Q1200  . sodium chloride  10-40 mL Intracatheter Q12H  . DISCONTD: insulin glargine  10 Units Subcutaneous QHS  . DISCONTD: methylPREDNISolone (SOLU-MEDROL) injection  80 mg Intravenous Q6H   Continuous Infusions:   . dextrose 5 % and 0.45 % NaCl with KCl 20 mEq/L 20 mL/hr at 10/18/11 0700  . feeding supplement (JEVITY 1.2 CAL) 20 mL/hr at 10/16/11 1313  . propofol 25 mcg/kg/min (10/18/11 0600)   PRN Meds:.acetaminophen, acetaminophen, alum & mag hydroxide-simeth, hydrALAZINE, levalbuterol, LORazepam,  ondansetron (ZOFRAN) IV, ondansetron, sodium chloride  Assessment/Plan: He has respiratory failure. He was unable to wean yesterday. He may be able to wean today. It is possible that we're simply going to have to remove the tube because of his anxiety. I will have him wean today and perhaps extubate.  Principal Problem:  *Acute respiratory failure with hypercapnia Active Problems:  COPD with acute exacerbation  Tachycardia  Right Axis Deviation  Anemia  Chronic respiratory failure with hypoxia  Chronic back pain  PNA (pneumonia)  Hypertension  Rhabdomyolysis  Hyperglycemia, drug-induced  Encephalopathy acute  Acute renal failure    LOS: 6 days   Arick Mareno L [facility] Pager: [phone] 10/18/2011, 8:30 AM

## 2011-10-19 LAB — GLUCOSE, CAPILLARY
Glucose-Capillary: 130 mg/dL — ABNORMAL HIGH (ref 70–99)
Glucose-Capillary: 128 mg/dL — ABNORMAL HIGH (ref 70–99)
Glucose-Capillary: 129 mg/dL — ABNORMAL HIGH (ref 70–99)
Glucose-Capillary: 167 mg/dL — ABNORMAL HIGH (ref 70–99)
Glucose-Capillary: 120 mg/dL — ABNORMAL HIGH (ref 70–99)

## 2011-10-19 LAB — BLOOD GAS, ARTERIAL
Bicarbonate: 33.9 mEq/L — ABNORMAL HIGH (ref 20.0–24.0)
O2 Saturation: 96.6 %
PEEP: 5 cmH2O
pCO2 arterial: 60 mmHg (ref 35.0–45.0)
pO2, Arterial: 89.8 mmHg (ref 80.0–100.0)

## 2011-10-19 LAB — CREATININE, SERUM
Creatinine, Ser: 1.32 mg/dL (ref 0.50–1.35)
GFR calc Af Amer: 59 mL/min — ABNORMAL LOW (ref >90)

## 2011-10-19 NOTE — Progress Notes (Signed)
Have decreased PT"S rate from 14 to 10. Hopefully  this will allow his PH to become closer to his base line. His secretions appear for the most part to be small, breath sounds decreased with occasional wheezes. He appears at this time to be setting his own frequency of 14-16 times a minute. This should allow him to be extubated easier in morning. Will monitor through out night for problems.

## 2011-10-19 NOTE — Progress Notes (Signed)
Pt weaned from 0815 to 1145. Pts BP and HR remained stable during entire weaning trial. Pt did wean on of Diprivan, and received 1mg  IV Ativan at start of wean. RT at bedside at 1145 and weaning trial ended and sedation meds increased so pt could rest.

## 2011-10-19 NOTE — Progress Notes (Addendum)
CRITICAL VALUE ALERT  Critical value received: pCO2 60.0  Date of notification: 10/19/2011  Time of notification:  0544  Critical value read back:yes  Nurse who received alert:  Letta Pate, RN  MD notified (1st page):  Dr. Orvan Falconer via text page  Time of first page:  [phone]  MD notified (2nd page):  Time of second page:  Responding MD:  Dr. Orvan Falconer  Time MD responded:  [phone]  NO NEW ORDERS RECEIVED AT THIS TIME

## 2011-10-19 NOTE — Progress Notes (Signed)
The Pt weaned until 1145. His BP and his HR maintained. He does cough a lot on the vent and he should be able to be extubated tomorrow.

## 2011-10-19 NOTE — Progress Notes (Signed)
NAME:  ZALMEN, WRIGHTSMAN NO.:  [id number]  MEDICAL RECORD NO.:  [id number]  LOCATION:  [id number]                          FACILITY:  [facility]  PHYSICIAN:  Jalah Warmuth L. Juanetta Gosling, M.D.DATE OF BIRTH:  [birth year]  DATE OF PROCEDURE: DATE OF DISCHARGE:                                PROGRESS NOTE   Mr. Word is still intubated on the ventilator.  He has no new complaints.  He is awake and alert.  He failed extubation yesterday.  PHYSICAL EXAMINATION:  VITAL SIGNS:  Shows his temp is 98.8, pulse 100, respirations 29, blood pressure 175/98. CHEST:  With decreased breath sounds and end-expiratory wheezes. HEART:  Regular. ABDOMEN:  Soft. EXTREMITIES:  Showed no edema. CENTRAL NERVOUS SYSTEM:  Grossly intact.  ASSESSMENT:  He has remained intubated.  He has failed weaning attempts.  Plan then is to continue with his current treatments and medications. No changes today.  We will see if we can wean today.     Maciah Feeback L. Juanetta Gosling, M.D.     ELH/MEDQ  D:  10/19/2011  T:  10/19/2011  Job:  [id number]

## 2011-10-19 NOTE — Progress Notes (Signed)
This is the first day the pt was able to wean without his BP and HR increasing . He still has low VT but he does have severe COPD. Pt should be able to be extubated 10/20/11

## 2011-10-19 NOTE — Progress Notes (Addendum)
Pt morning Blood gas show's PH ,7.370 ,PCO2 60.0, PO2 89.8 , HCO3 33.9 on rate of 10 PRVC, 500VT, FIO2 32, PEEP 5 . This is most likely about range of Pt's normal gas. Will hold for now on PT wean. Pt is still breathing , total frequency of 12-13 on vent.

## 2011-10-19 NOTE — Progress Notes (Signed)
Overnight, had a low grade fever.  Still intubated  Objective: Vital signs in last 24 hours: Filed Vitals:   10/19/11 0600 10/19/11 0615 10/19/11 0630 10/19/11 0645  BP: 112/67 105/68 102/70 101/73  Pulse: 84 82 80 79  Temp:      TempSrc:      Resp: 9 9 9 9   Height:      Weight:      SpO2: 100% 100% 100% 100%   Weight change: 0.1 kg (3.5 oz)  Intake/Output Summary (Last 24 hours) at 10/19/11 0741 Last data filed at 10/19/11 0600  Gross per 24 hour  Intake 2447.69 ml  Output   1300 ml  Net 1147.69 ml   General: intubated. Sedated Lungs:  CTA without wheezes rhonchi or rales Cardiovascular, regular rate rhythm without murmurs gallops rubs Abdomen soft nontender nondistended Extremities no clubbing cyanosis or edema  Lab Results: Basic Metabolic Panel:  Lab 10/19/11 1610 10/18/11 0515 10/16/11 0533  NA -- 144 143  K -- 4.3 4.2  CL -- 106 105  CO2 -- 34* 32  GLUCOSE -- 155* 138*  BUN -- 40* 32*  CREATININE 1.32 1.33 --  CALCIUM -- 9.3 9.7  MG -- -- --  PHOS -- -- --   Liver Function Tests:  Lab 10/15/11 0445 10/12/11 0958  AST 59* 52*  ALT 43 27  ALKPHOS 54 63  BILITOT 0.3 0.6  PROT 6.6 8.6*  ALBUMIN 2.9* 3.9   No results found for this basename: LIPASE:2,AMYLASE:2 in the last 168 hours No results found for this basename: AMMONIA:2 in the last 168 hours CBC:  Lab 10/18/11 0515 10/15/11 0445 10/12/11 0958  WBC 10.9* 8.8 --  NEUTROABS -- -- 7.2  HGB 11.2* 10.5* --  HCT 35.1* 33.7* --  MCV 92.9 94.4 --  PLT 158 178 --   Cardiac Enzymes:  Lab 10/17/11 0502 10/16/11 0533 10/15/11 0445 10/14/11 0657 10/13/11 0426 10/12/11 1526  CKTOTAL 360* 551* 1318* -- -- --  CKMB 4.8* 7.6* 18.4* -- -- --  CKMBINDEX -- -- -- -- -- --  TROPONINI -- -- -- <0.30 <0.30 <0.30   BNP:  Lab 10/16/11 1043 10/14/11 0657 10/12/11 0958  PROBNP 226.5 321.9 408.7   D-Dimer:  Lab 10/12/11 0958  DDIMER 0.87*   CBG:  Lab 10/19/11 0345 10/18/11 2258 10/18/11 1935 10/18/11  1647 10/18/11 1133 10/18/11 0749  GLUCAP 130* 134* 124* 145* 114* 153*   Hemoglobin A1C: No results found for this basename: HGBA1C in the last 168 hours Fasting Lipid Panel:  Lab 10/17/11 0502  CHOL --  HDL --  LDLCALC --  TRIG 150*  CHOLHDL --  LDLDIRECT --   Thyroid Function Tests:  Lab 10/12/11 0958  TSH 0.543  T4TOTAL --  FREET4 1.20  T3FREE --  THYROIDAB --   Coagulation: No results found for this basename: LABPROT:4,INR:4 in the last 168 hours Anemia Panel:  Lab 10/13/11 0426  VITAMINB12 680  FOLATE --  FERRITIN 140  TIBC 235  IRON 26*  RETICCTPCT --   Urine Drug Screen: Drugs of Abuse  No results found for this basename: labopia,  cocainscrnur,  labbenz,  amphetmu,  thcu,  labbarb    Alcohol Level: No results found for this basename: ETH:2 in the last 168 hours Urinalysis:  Lab 10/12/11 1019  COLORURINE YELLOW  LABSPEC >1.030*  PHURINE 6.0  GLUCOSEU NEGATIVE  HGBUR LARGE*  BILIRUBINUR NEGATIVE  KETONESUR NEGATIVE  PROTEINUR 100*  UROBILINOGEN 0.2  NITRITE NEGATIVE  LEUKOCYTESUR  NEGATIVE   Micro Results: Recent Results (from the past 240 hour(s))  CULTURE, BLOOD (ROUTINE X 2)     Status: Normal   Collection Time   10/12/11  9:59 AM      Component Value Range Status Comment   Specimen Description BLOOD LEFT WRIST   Final    Special Requests BOTTLES DRAWN AEROBIC AND ANAEROBIC 8CC   Final    Culture NO GROWTH 5 DAYS   Final    Report Status 10/17/2011 FINAL   Final   CULTURE, BLOOD (ROUTINE X 2)     Status: Normal   Collection Time   10/12/11 10:03 AM      Component Value Range Status Comment   Specimen Description BLOOD RIGHT HAND   Final    Special Requests BOTTLES DRAWN AEROBIC AND ANAEROBIC 8CC   Final    Culture NO GROWTH 5 DAYS   Final    Report Status 10/17/2011 FINAL   Final   URINE CULTURE     Status: Normal   Collection Time   10/12/11 10:19 AM      Component Value Range Status Comment   Specimen Description URINE, CLEAN CATCH    Final    Special Requests NONE   Final    Culture  Setup Time 10/12/2011 19:39   Final    Colony Count NO GROWTH   Final    Culture NO GROWTH   Final    Report Status 10/14/2011 FINAL   Final   MRSA PCR SCREENING     Status: Normal   Collection Time   10/12/11  3:21 PM      Component Value Range Status Comment   MRSA by PCR NEGATIVE  NEGATIVE Final    Studies/Results: No results found.  Scheduled Meds:    . antiseptic oral rinse  15 mL Mouth Rinse QID  . cefTRIAXone (ROCEPHIN)  IV  1 g Intravenous Q24H  . chlorhexidine  15 mL Mouth Rinse BID  . enoxaparin (LOVENOX) injection  40 mg Subcutaneous Q24H  . insulin aspart  0-9 Units Subcutaneous Q4H  . insulin glargine  5 Units Subcutaneous QHS  . ipratropium  0.5 mg Nebulization Q4H  . levalbuterol  0.63 mg Nebulization Q4H  . methylPREDNISolone (SOLU-MEDROL) injection  80 mg Intravenous Q12H  . pantoprazole sodium  40 mg Per Tube Q1200  . sodium chloride  10-40 mL Intracatheter Q12H  . DISCONTD: azithromycin  500 mg Intravenous Q24H   Continuous Infusions:    . dextrose 5 % and 0.45 % NaCl with KCl 20 mEq/L 20 mL/hr at 10/19/11 0600  . feeding supplement (JEVITY 1.2 CAL) 20 mL/hr at 10/16/11 1313  . propofol 15 mcg/kg/min (10/19/11 0721)   PRN Meds:.acetaminophen, acetaminophen, alum & mag hydroxide-simeth, hydrALAZINE, levalbuterol, LORazepam, ondansetron (ZOFRAN) IV, ondansetron, sodium chloride Assessment/Plan: Principal Problem:  *Acute respiratory failure with hypercapnia Active Problems:  COPD with acute exacerbation  PNA (pneumonia)  Anemia  Hypertension  Hyperglycemia, drug-induced  Acute renal failure  Tachycardia  Right Axis Deviation  Chronic respiratory failure with hypoxia  Chronic back pain  Rhabdomyolysis  Encephalopathy acute  Monitor temps. Continue Rocephin.  Has had 6 days azithro & stopped yesterday. Vent per pulmonary.   LOS: 7 days   Hether Anselmo L 10/19/2011, 7:41 AM

## 2011-10-20 DIAGNOSIS — I10 Essential (primary) hypertension: Secondary | ICD-10-CM

## 2011-10-20 LAB — BLOOD GAS, ARTERIAL
Bicarbonate: 35 mEq/L — ABNORMAL HIGH (ref 20.0–24.0)
FIO2: 0.35 %
O2 Saturation: 97.7 %
PEEP: 5 cmH2O
RATE: 10 resp/min
pCO2 arterial: 62.6 mmHg (ref 35.0–45.0)
pO2, Arterial: 106 mmHg — ABNORMAL HIGH (ref 80.0–100.0)

## 2011-10-20 LAB — GLUCOSE, CAPILLARY
Glucose-Capillary: 106 mg/dL — ABNORMAL HIGH (ref 70–99)
Glucose-Capillary: 114 mg/dL — ABNORMAL HIGH (ref 70–99)
Glucose-Capillary: 151 mg/dL — ABNORMAL HIGH (ref 70–99)
Glucose-Capillary: 159 mg/dL — ABNORMAL HIGH (ref 70–99)
Glucose-Capillary: 92 mg/dL (ref 70–99)

## 2011-10-20 LAB — CBC
MCH: 29 pg (ref 26.0–34.0)
MCHC: 30.5 g/dL (ref 30.0–36.0)
MCV: 95.2 fL (ref 78.0–100.0)
Platelets: 183 10*3/uL (ref 150–400)
RDW: 14.5 % (ref 11.5–15.5)

## 2011-10-20 MED ORDER — METHYLPREDNISOLONE SODIUM SUCC 125 MG IJ SOLR
60.0000 mg | Freq: Two times a day (BID) | INTRAMUSCULAR | Status: DC
  Administered 2011-10-20 – 2011-10-22 (×4): 60 mg via INTRAVENOUS
  Filled 2011-10-20 (×4): qty 2

## 2011-10-20 MED ORDER — JEVITY 1.2 CAL PO LIQD
1000.0000 mL | ORAL | Status: DC
  Filled 2011-10-20 (×2): qty 1000

## 2011-10-20 NOTE — Progress Notes (Signed)
Pt extubated at 0945. Pt remains on 35% ventimask. O2 sats in 90s. Lung sounds rhonchorous bilaterally. Pt's cough is weak, and unable to cough out sputum. Dr Lendell Caprice notified that pt began coughing as soon as sips of water given. NPO order given, and speech/swallow consult done.

## 2011-10-20 NOTE — Progress Notes (Signed)
Stable over night. No fevers  Objective: Vital signs in last 24 hours: Filed Vitals:   10/20/11 0630 10/20/11 0645 10/20/11 0700 10/20/11 0739  BP: 115/72  121/65   Pulse: 87 92 87   Temp:      TempSrc:      Resp: 12 17 13    Height:      Weight:      SpO2: 99% 98% 100% 100%   Weight change: 0.1 kg (3.5 oz)  Intake/Output Summary (Last 24 hours) at 10/20/11 0754 Last data filed at 10/20/11 1610  Gross per 24 hour  Intake 1530.32 ml  Output   1400 ml  Net 130.32 ml   General: intubated. Eyes open periodically Lungs:  CTA without wheezes rhonchi or rales Cardiovascular, regular rate rhythm without murmurs gallops rubs Abdomen soft nontender nondistended Extremities no clubbing cyanosis or edema  Lab Results: Basic Metabolic Panel:  Lab 10/19/11 9604 10/18/11 0515 10/16/11 0533  NA -- 144 143  K -- 4.3 4.2  CL -- 106 105  CO2 -- 34* 32  GLUCOSE -- 155* 138*  BUN -- 40* 32*  CREATININE 1.32 1.33 --  CALCIUM -- 9.3 9.7  MG -- -- --  PHOS -- -- --   Liver Function Tests:  Lab 10/15/11 0445  AST 59*  ALT 43  ALKPHOS 54  BILITOT 0.3  PROT 6.6  ALBUMIN 2.9*   No results found for this basename: LIPASE:2,AMYLASE:2 in the last 168 hours No results found for this basename: AMMONIA:2 in the last 168 hours CBC:  Lab 10/20/11 0432 10/18/11 0515  WBC 11.8* 10.9*  NEUTROABS -- --  HGB 11.4* 11.2*  HCT 37.4* 35.1*  MCV 95.2 92.9  PLT 183 158   Cardiac Enzymes:  Lab 10/17/11 0502 10/16/11 0533 10/15/11 0445 10/14/11 0657  CKTOTAL 360* 551* 1318* --  CKMB 4.8* 7.6* 18.4* --  CKMBINDEX -- -- -- --  TROPONINI -- -- -- <0.30   BNP:  Lab 10/16/11 1043 10/14/11 0657  PROBNP 226.5 321.9   D-Dimer: No results found for this basename: DDIMER:2 in the last 168 hours CBG:  Lab 10/20/11 0351 10/20/11 0004 10/19/11 1948 10/19/11 1512 10/19/11 1109 10/19/11 0813  GLUCAP 159* 151* 120* 167* 129* 128*   Hemoglobin A1C: No results found for this basename: HGBA1C in  the last 168 hours Fasting Lipid Panel:  Lab 10/20/11 0432  CHOL --  HDL --  LDLCALC --  TRIG 146  CHOLHDL --  LDLDIRECT --   Thyroid Function Tests: No results found for this basename: TSH,T4TOTAL,FREET4,T3FREE,THYROIDAB in the last 168 hours Coagulation: No results found for this basename: LABPROT:4,INR:4 in the last 168 hours Anemia Panel: No results found for this basename: VITAMINB12,FOLATE,FERRITIN,TIBC,IRON,RETICCTPCT in the last 168 hours Urine Drug Screen: Drugs of Abuse  No results found for this basename: labopia,  cocainscrnur,  labbenz,  amphetmu,  thcu,  labbarb    Alcohol Level: No results found for this basename: ETH:2 in the last 168 hours Urinalysis: No results found for this basename: COLORURINE:2,APPERANCEUR:2,LABSPEC:2,PHURINE:2,GLUCOSEU:2,HGBUR:2,BILIRUBINUR:2,KETONESUR:2,PROTEINUR:2,UROBILINOGEN:2,NITRITE:2,LEUKOCYTESUR:2 in the last 168 hours Micro Results: Recent Results (from the past 240 hour(s))  CULTURE, BLOOD (ROUTINE X 2)     Status: Normal   Collection Time   10/12/11  9:59 AM      Component Value Range Status Comment   Specimen Description BLOOD LEFT WRIST   Final    Special Requests BOTTLES DRAWN AEROBIC AND ANAEROBIC 8CC   Final    Culture NO GROWTH 5 DAYS  Final    Report Status 10/17/2011 FINAL   Final   CULTURE, BLOOD (ROUTINE X 2)     Status: Normal   Collection Time   10/12/11 10:03 AM      Component Value Range Status Comment   Specimen Description BLOOD RIGHT HAND   Final    Special Requests BOTTLES DRAWN AEROBIC AND ANAEROBIC 8CC   Final    Culture NO GROWTH 5 DAYS   Final    Report Status 10/17/2011 FINAL   Final   URINE CULTURE     Status: Normal   Collection Time   10/12/11 10:19 AM      Component Value Range Status Comment   Specimen Description URINE, CLEAN CATCH   Final    Special Requests NONE   Final    Culture  Setup Time 10/12/2011 19:39   Final    Colony Count NO GROWTH   Final    Culture NO GROWTH   Final    Report  Status 10/14/2011 FINAL   Final   MRSA PCR SCREENING     Status: Normal   Collection Time   10/12/11  3:21 PM      Component Value Range Status Comment   MRSA by PCR NEGATIVE  NEGATIVE Final    Studies/Results: No results found.  Scheduled Meds:    . antiseptic oral rinse  15 mL Mouth Rinse QID  . cefTRIAXone (ROCEPHIN)  IV  1 g Intravenous Q24H  . chlorhexidine  15 mL Mouth Rinse BID  . enoxaparin (LOVENOX) injection  40 mg Subcutaneous Q24H  . insulin aspart  0-9 Units Subcutaneous Q4H  . insulin glargine  5 Units Subcutaneous QHS  . ipratropium  0.5 mg Nebulization Q4H  . levalbuterol  0.63 mg Nebulization Q4H  . methylPREDNISolone (SOLU-MEDROL) injection  80 mg Intravenous Q12H  . pantoprazole sodium  40 mg Per Tube Q1200  . sodium chloride  10-40 mL Intracatheter Q12H   Continuous Infusions:    . dextrose 5 % and 0.45 % NaCl with KCl 20 mEq/L 20 mL/hr at 10/19/11 1800  . feeding supplement (JEVITY 1.2 CAL)    . propofol 20 mcg/kg/min (10/20/11 1610)  . DISCONTD: feeding supplement (JEVITY 1.2 CAL) 60 mL/hr (10/19/11 1023)   PRN Meds:.acetaminophen, acetaminophen, alum & mag hydroxide-simeth, hydrALAZINE, levalbuterol, LORazepam, ondansetron (ZOFRAN) IV, ondansetron, sodium chloride Assessment/Plan: Principal Problem:  *Acute respiratory failure with hypercapnia Active Problems:  COPD with acute exacerbation  PNA (pneumonia)  Anemia  Hypertension  Hyperglycemia, drug-induced  Acute renal failure  Tachycardia  Right Axis Deviation  Chronic respiratory failure with hypoxia  Chronic back pain  Rhabdomyolysis  Encephalopathy acute  Can hopefully extubate today. Remains on steroids and Rocephin.  Azithro stopped after 6 days. Hold TF in anticipation of extubation   LOS: 8 days   Harold Johnson L 10/20/2011, 7:54 AM

## 2011-10-20 NOTE — Progress Notes (Signed)
Subjective: He remains intubated and on a ventilator. He is a wake and arousable.  Objective: Vital signs in last 24 hours: Temp:  [97.9 F (36.6 C)-99.3 F (37.4 C)] 99.3 F (37.4 C) (07/15 0800) Pulse Rate:  [75-100] 87  (07/15 0800) Resp:  [9-29] 13  (07/15 0800) BP: (94-175)/(54-98) 137/71 mmHg (07/15 0800) SpO2:  [96 %-100 %] 99 % (07/15 0800) FiO2 (%):  [32 %-35.5 %] 35 % (07/15 0800) Weight:  [98.5 kg (217 lb 2.5 oz)] 98.5 kg (217 lb 2.5 oz) (07/15 0500) Weight change: 0.1 kg (3.5 oz) Last BM Date: 10/18/11  Intake/Output from previous day: 07/14 0701 - 07/15 0700 In: 1530.3 [I.V.:820.3; NG/GT:660; IV Piggyback:50] Out: 1400 [Urine:1400]  PHYSICAL EXAM General appearance: no distress and Intubated on the ventilator Resp: rhonchi bilaterally Cardio: regular rate and rhythm, S1, S2 normal, no murmur, click, rub or gallop GI: soft, non-tender; bowel sounds normal; no masses,  no organomegaly Extremities: extremities normal, atraumatic, no cyanosis or edema  Lab Results:    Basic Metabolic Panel:  Basename 10/19/11 0528 10/18/11 0515  NA -- 144  K -- 4.3  CL -- 106  CO2 -- 34*  GLUCOSE -- 155*  BUN -- 40*  CREATININE 1.32 1.33  CALCIUM -- 9.3  MG -- --  PHOS -- --   Liver Function Tests: No results found for this basename: AST:2,ALT:2,ALKPHOS:2,BILITOT:2,PROT:2,ALBUMIN:2 in the last 72 hours No results found for this basename: LIPASE:2,AMYLASE:2 in the last 72 hours No results found for this basename: AMMONIA:2 in the last 72 hours CBC:  Basename 10/20/11 0432 10/18/11 0515  WBC 11.8* 10.9*  NEUTROABS -- --  HGB 11.4* 11.2*  HCT 37.4* 35.1*  MCV 95.2 92.9  PLT 183 158   Cardiac Enzymes: No results found for this basename: CKTOTAL:3,CKMB:3,CKMBINDEX:3,TROPONINI:3 in the last 72 hours BNP: No results found for this basename: PROBNP:3 in the last 72 hours D-Dimer: No results found for this basename: DDIMER:2 in the last 72 hours CBG:  Basename  10/20/11 0351 10/20/11 0004 10/19/11 1948 10/19/11 1512 10/19/11 1109 10/19/11 0813  GLUCAP 159* 151* 120* 167* 129* 128*   Hemoglobin A1C: No results found for this basename: HGBA1C in the last 72 hours Fasting Lipid Panel:  Basename 10/20/11 0432  CHOL --  HDL --  LDLCALC --  TRIG 146  CHOLHDL --  LDLDIRECT --   Thyroid Function Tests: No results found for this basename: TSH,T4TOTAL,FREET4,T3FREE,THYROIDAB in the last 72 hours Anemia Panel: No results found for this basename: VITAMINB12,FOLATE,FERRITIN,TIBC,IRON,RETICCTPCT in the last 72 hours Coagulation: No results found for this basename: LABPROT:2,INR:2 in the last 72 hours Urine Drug Screen: Drugs of Abuse  No results found for this basename: labopia, cocainscrnur, labbenz, amphetmu, thcu, labbarb    Alcohol Level: No results found for this basename: ETH:2 in the last 72 hours Urinalysis: No results found for this basename: COLORURINE:2,APPERANCEUR:2,LABSPEC:2,PHURINE:2,GLUCOSEU:2,HGBUR:2,BILIRUBINUR:2,KETONESUR:2,PROTEINUR:2,UROBILINOGEN:2,NITRITE:2,LEUKOCYTESUR:2 in the last 72 hours Misc. Labs:  ABGS  Basename 10/20/11 0447  PHART 7.367  PO2ART 106.0*  TCO2 32.1  HCO3 35.0*   CULTURES Recent Results (from the past 240 hour(s))  CULTURE, BLOOD (ROUTINE X 2)     Status: Normal   Collection Time   10/12/11  9:59 AM      Component Value Range Status Comment   Specimen Description BLOOD LEFT WRIST   Final    Special Requests BOTTLES DRAWN AEROBIC AND ANAEROBIC 8CC   Final    Culture NO GROWTH 5 DAYS   Final    Report Status 10/17/2011  FINAL   Final   CULTURE, BLOOD (ROUTINE X 2)     Status: Normal   Collection Time   10/12/11 10:03 AM      Component Value Range Status Comment   Specimen Description BLOOD RIGHT HAND   Final    Special Requests BOTTLES DRAWN AEROBIC AND ANAEROBIC 8CC   Final    Culture NO GROWTH 5 DAYS   Final    Report Status 10/17/2011 FINAL   Final   URINE CULTURE     Status: Normal    Collection Time   10/12/11 10:19 AM      Component Value Range Status Comment   Specimen Description URINE, CLEAN CATCH   Final    Special Requests NONE   Final    Culture  Setup Time 10/12/2011 19:39   Final    Colony Count NO GROWTH   Final    Culture NO GROWTH   Final    Report Status 10/14/2011 FINAL   Final   MRSA PCR SCREENING     Status: Normal   Collection Time   10/12/11  3:21 PM      Component Value Range Status Comment   MRSA by PCR NEGATIVE  NEGATIVE Final    Studies/Results: No results found.  Medications:  Scheduled:   . antiseptic oral rinse  15 mL Mouth Rinse QID  . cefTRIAXone (ROCEPHIN)  IV  1 g Intravenous Q24H  . chlorhexidine  15 mL Mouth Rinse BID  . enoxaparin (LOVENOX) injection  40 mg Subcutaneous Q24H  . insulin aspart  0-9 Units Subcutaneous Q4H  . insulin glargine  5 Units Subcutaneous QHS  . ipratropium  0.5 mg Nebulization Q4H  . levalbuterol  0.63 mg Nebulization Q4H  . methylPREDNISolone (SOLU-MEDROL) injection  80 mg Intravenous Q12H  . pantoprazole sodium  40 mg Per Tube Q1200  . sodium chloride  10-40 mL Intracatheter Q12H   Continuous:   . dextrose 5 % and 0.45 % NaCl with KCl 20 mEq/L 20 mL/hr at 10/19/11 1800  . feeding supplement (JEVITY 1.2 CAL)    . propofol 20 mcg/kg/min (10/20/11 1610)  . DISCONTD: feeding supplement (JEVITY 1.2 CAL) 60 mL/hr (10/19/11 1023)   RUE:[id number], acetaminophen, alum & mag hydroxide-simeth, hydrALAZINE, levalbuterol, LORazepam, ondansetron (ZOFRAN) IV, ondansetron, sodium chloride  Assesment: He has acute on chronic respiratory failure. He has been on the ventilator for several days. He is improving. He has failed weaning and I don't know that he will ever meet formal weaning criteria. I think is he's close today and looks like he may be able to sustain off the ventilator we should go ahead and try to extubate him. Principal Problem:  *Acute respiratory failure with hypercapnia Active Problems:  COPD  with acute exacerbation  Tachycardia  Right Axis Deviation  Anemia  Chronic respiratory failure with hypoxia  Chronic back pain  PNA (pneumonia)  Hypertension  Rhabdomyolysis  Hyperglycemia, drug-induced  Encephalopathy acute  Acute renal failure    Plan: As above    LOS: 8 days   Harold Johnson L 10/20/2011, 8:01 AM

## 2011-10-20 NOTE — Progress Notes (Signed)
PT tital volume increased to 560, this is closer to 8kg/l ideal wt.

## 2011-10-20 NOTE — Evaluation (Signed)
Clinical/Bedside Swallow Evaluation Patient Details  Name: Harold Johnson MRN: [id number] Date of Birth: [birth year]  Today's Date: 10/20/2011 Time: 5284-1324 SLP Time Calculation (min): 37 min  Past Medical History:  Past Medical History  Diagnosis Date  . COPD (chronic obstructive pulmonary disease)   . Hypertension   . Hypercholesteremia   . Stroke   . Chronic respiratory failure with hypoxia   . Chronic back pain   . Elevated CK 10/13/2011   Past Surgical History:  Past Surgical History  Procedure Date  . Fatty tumor     Excision from left shoulder.  . Inguinal hernia repair    HPI:  Mr. Harold Johnson is a 77 yo man who was admitted with respiratory failure, COPD, and PNA. He was intubated shortly after admission and was just extubated earlier today.   Assessment / Plan / Recommendation Clinical Impression  Pt is currently at high risk for aspiration given lethargy, weakness, compromised respiratory status, and suspected delayed swallow initiation. He has a weak/ineffective congested cough and breathy vocal quality. He repeatedly requests water, however coughs after small sips and sounds as if he is having trouble clearing his secretions. Recommend NPO with SLP to re-eval tomorrow.     Aspiration Risk  Moderate    Diet Recommendation NPO        Other  Recommendations     Follow Up Recommendations       Frequency and Duration min 2x/week  1 week   Pertinent Vitals/Pain     SLP Swallow Goals  Pending re-eval tomorrow   Swallow Study Prior Functional Status   Living at home    General Date of Onset: 10/12/11 HPI: Mr. Harold Johnson is a 77 yo man who was admitted with respiratory failure, COPD, and PNA. He was intubated shortly after admission and was just extubated earlier today. Type of Study: Bedside swallow evaluation Previous Swallow Assessment: None on record Diet Prior to this Study: NPO Temperature Spikes Noted: No Respiratory Status: Supplemental O2  delivered via (comment) History of Recent Intubation: Yes Length of Intubations (days): 6 days Date extubated: 10/20/11 Behavior/Cognition: Cooperative;Requires cueing;Lethargic Oral Cavity - Dentition: Missing dentition Self-Feeding Abilities: Total assist Patient Positioning: Partially reclined Baseline Vocal Quality: Breathy;Low vocal intensity Volitional Cough: Weak;Congested Volitional Swallow: Unable to elicit    Oral/Motor/Sensory Function Overall Oral Motor/Sensory Function:  (fatigue and lethargy negatively impact) Labial Symmetry: Within Functional Limits Labial Strength: Reduced Lingual Symmetry: Within Functional Limits Lingual Strength: Reduced Facial ROM: Within Functional Limits Facial Symmetry: Within Functional Limits   Ice Chips Ice chips: Impaired Presentation: Spoon Pharyngeal Phase Impairments: Suspected delayed Swallow;Decreased hyoid-laryngeal movement;Throat Clearing - Delayed   Thin Liquid Thin Liquid: Impaired Presentation: Spoon;Cup Oral Phase Impairments: Reduced labial seal;Impaired anterior to posterior transit Pharyngeal  Phase Impairments: Suspected delayed Swallow;Cough - Immediate;Cough - Delayed;Throat Clearing - Delayed    Nectar Thick Nectar Thick Liquid: Impaired Presentation: Spoon Pharyngeal Phase Impairments: Suspected delayed Swallow;Throat Clearing - Delayed   Honey Thick Honey Thick Liquid: Not tested   Puree Puree: Not tested   Solid Solid: Not tested   Thank you,  Havery Moros, CCC-SLP [phone]  PORTER,DABNEY 10/20/2011,5:30 PM

## 2011-10-20 NOTE — Procedures (Signed)
Extubation Procedure Note  Patient Details:   Name: Harold Johnson DOB: 1934-12-29 MRN: [id number]   Airway Documentation:  Airway 8 mm (Active)  Secured at (cm) 22 cm 10/20/2011  8:25 AM  Measured From Lips 10/20/2011  8:25 AM  Secured Location Center 10/20/2011  8:25 AM  Secured By [employer] 10/20/2011  8:25 AM  Tube Holder Repositioned Yes 10/20/2011  8:25 AM  Cuff Pressure (cm H2O) 25 cm H2O 10/20/2011  4:21 AM  Site Condition Dry 10/20/2011  8:25 AM  Patient was unable to perform the NIF and VC.  MD ordered to extubate patient.  Pt sats are 99% on 40% venturi mask.  Will continue to monitor.  Evaluation  O2 sats: stable throughout Complications: No apparent complications Patient did tolerate procedure well. Bilateral Breath Sounds: Diminished Suctioning: Airway Yes  Noreene Filbert 10/20/2011, 9:48 AM

## 2011-10-21 ENCOUNTER — Inpatient Hospital Stay (HOSPITAL_COMMUNITY): Payer: Medicare PPO

## 2011-10-21 DIAGNOSIS — R5383 Other fatigue: Secondary | ICD-10-CM

## 2011-10-21 DIAGNOSIS — J329 Chronic sinusitis, unspecified: Secondary | ICD-10-CM | POA: Diagnosis not present

## 2011-10-21 DIAGNOSIS — R1310 Dysphagia, unspecified: Secondary | ICD-10-CM

## 2011-10-21 LAB — GLUCOSE, CAPILLARY
Glucose-Capillary: 101 mg/dL — ABNORMAL HIGH (ref 70–99)
Glucose-Capillary: 84 mg/dL (ref 70–99)

## 2011-10-21 LAB — BLOOD GAS, ARTERIAL
Bicarbonate: 37.6 mEq/L — ABNORMAL HIGH (ref 20.0–24.0)
TCO2: 34.4 mmol/L (ref 0–100)
pCO2 arterial: 71.8 mmHg (ref 35.0–45.0)
pH, Arterial: 7.339 — ABNORMAL LOW (ref 7.350–7.450)
pO2, Arterial: 83.6 mmHg (ref 80.0–100.0)

## 2011-10-21 LAB — BASIC METABOLIC PANEL
BUN: 42 mg/dL — ABNORMAL HIGH (ref 6–23)
CO2: 36 mEq/L — ABNORMAL HIGH (ref 19–32)
Calcium: 10.1 mg/dL (ref 8.4–10.5)
Chloride: 103 mEq/L (ref 96–112)
Creatinine, Ser: 1.09 mg/dL (ref 0.50–1.35)
Glucose, Bld: 111 mg/dL — ABNORMAL HIGH (ref 70–99)

## 2011-10-21 LAB — AMMONIA: Ammonia: 17 umol/L (ref 11–60)

## 2011-10-21 MED ORDER — RESOURCE THICKENUP CLEAR PO POWD
ORAL | Status: DC | PRN
  Filled 2011-10-21: qty 125

## 2011-10-21 MED ORDER — PANTOPRAZOLE SODIUM 40 MG IV SOLR
40.0000 mg | Freq: Every day | INTRAVENOUS | Status: DC
  Administered 2011-10-22 – 2011-10-23 (×2): 40 mg via INTRAVENOUS
  Filled 2011-10-21 (×3): qty 40

## 2011-10-21 MED ORDER — DEXTROSE-NACL 5-0.9 % IV SOLN
INTRAVENOUS | Status: DC
  Administered 2011-10-21 – 2011-10-22 (×2): via INTRAVENOUS

## 2011-10-21 MED ORDER — PRO-STAT SUGAR FREE PO LIQD
30.0000 mL | Freq: Two times a day (BID) | ORAL | Status: DC
  Administered 2011-10-22 – 2011-10-24 (×5): 30 mL via ORAL
  Filled 2011-10-21 (×5): qty 30

## 2011-10-21 NOTE — Progress Notes (Signed)
Subjective: He was able to be extubated yesterday. He is still very lethargic. Information from speech language pathology is noted and appreciated and I agree he is at high risk of aspiration. His cough is ineffective but slightly better than yesterday  Objective: Vital signs in last 24 hours: Temp:  [97.5 F (36.4 C)-99 F (37.2 C)] 98.8 F (37.1 C) (07/16 0800) Pulse Rate:  [79-102] 90  (07/16 0600) Resp:  [19-26] 23  (07/16 0600) BP: (105-192)/(64-101) 148/71 mmHg (07/16 0600) SpO2:  [96 %-100 %] 98 % (07/16 0708) FiO2 (%):  [35 %-35.3 %] 35 % (07/15 1859) Weight:  [100.1 kg (220 lb 10.9 oz)] 100.1 kg (220 lb 10.9 oz) (07/16 0500) Weight change: 1.6 kg (3 lb 8.4 oz) Last BM Date: 10/19/11  Intake/Output from previous day: 07/15 0701 - 07/16 0700 In: 652.4 [P.O.:120; I.V.:482.4; IV Piggyback:50] Out: 2100 [Urine:2100]  PHYSICAL EXAM General appearance: alert, cooperative and mild distress Resp: clear to auscultation bilaterally Cardio: regular rate and rhythm, S1, S2 normal, no murmur, click, rub or gallop GI: soft, non-tender; bowel sounds normal; no masses,  no organomegaly Extremities: extremities normal, atraumatic, no cyanosis or edema  Lab Results:    Basic Metabolic Panel:  Basename 10/21/11 0425 10/19/11 0528  NA 145 --  K 4.8 --  CL 103 --  CO2 36* --  GLUCOSE 111* --  BUN 42* --  CREATININE 1.09 1.32  CALCIUM 10.1 --  MG -- --  PHOS -- --   Liver Function Tests: No results found for this basename: AST:2,ALT:2,ALKPHOS:2,BILITOT:2,PROT:2,ALBUMIN:2 in the last 72 hours No results found for this basename: LIPASE:2,AMYLASE:2 in the last 72 hours No results found for this basename: AMMONIA:2 in the last 72 hours CBC:  Basename 10/20/11 0432  WBC 11.8*  NEUTROABS --  HGB 11.4*  HCT 37.4*  MCV 95.2  PLT 183   Cardiac Enzymes: No results found for this basename: CKTOTAL:3,CKMB:3,CKMBINDEX:3,TROPONINI:3 in the last 72 hours BNP: No results found for  this basename: PROBNP:3 in the last 72 hours D-Dimer: No results found for this basename: DDIMER:2 in the last 72 hours CBG:  Basename 10/21/11 0724 10/21/11 0411 10/20/11 2330 10/20/11 1930 10/20/11 1603 10/20/11 1132  GLUCAP 84 98 101* 92 106* 110*   Hemoglobin A1C: No results found for this basename: HGBA1C in the last 72 hours Fasting Lipid Panel:  Basename 10/20/11 0432  CHOL --  HDL --  LDLCALC --  TRIG 146  CHOLHDL --  LDLDIRECT --   Thyroid Function Tests: No results found for this basename: TSH,T4TOTAL,FREET4,T3FREE,THYROIDAB in the last 72 hours Anemia Panel: No results found for this basename: VITAMINB12,FOLATE,FERRITIN,TIBC,IRON,RETICCTPCT in the last 72 hours Coagulation: No results found for this basename: LABPROT:2,INR:2 in the last 72 hours Urine Drug Screen: Drugs of Abuse  No results found for this basename: labopia, cocainscrnur, labbenz, amphetmu, thcu, labbarb    Alcohol Level: No results found for this basename: ETH:2 in the last 72 hours Urinalysis: No results found for this basename: COLORURINE:2,APPERANCEUR:2,LABSPEC:2,PHURINE:2,GLUCOSEU:2,HGBUR:2,BILIRUBINUR:2,KETONESUR:2,PROTEINUR:2,UROBILINOGEN:2,NITRITE:2,LEUKOCYTESUR:2 in the last 72 hours Misc. Labs:  ABGS  Basename 10/20/11 0447  PHART 7.367  PO2ART 106.0*  TCO2 32.1  HCO3 35.0*   CULTURES Recent Results (from the past 240 hour(s))  CULTURE, BLOOD (ROUTINE X 2)     Status: Normal   Collection Time   10/12/11  9:59 AM      Component Value Range Status Comment   Specimen Description BLOOD LEFT WRIST   Final    Special Requests BOTTLES DRAWN AEROBIC AND ANAEROBIC  8CC   Final    Culture NO GROWTH 5 DAYS   Final    Report Status 10/17/2011 FINAL   Final   CULTURE, BLOOD (ROUTINE X 2)     Status: Normal   Collection Time   10/12/11 10:03 AM      Component Value Range Status Comment   Specimen Description BLOOD RIGHT HAND   Final    Special Requests BOTTLES DRAWN AEROBIC AND ANAEROBIC  8CC   Final    Culture NO GROWTH 5 DAYS   Final    Report Status 10/17/2011 FINAL   Final   URINE CULTURE     Status: Normal   Collection Time   10/12/11 10:19 AM      Component Value Range Status Comment   Specimen Description URINE, CLEAN CATCH   Final    Special Requests NONE   Final    Culture  Setup Time 10/12/2011 19:39   Final    Colony Count NO GROWTH   Final    Culture NO GROWTH   Final    Report Status 10/14/2011 FINAL   Final   MRSA PCR SCREENING     Status: Normal   Collection Time   10/12/11  3:21 PM      Component Value Range Status Comment   MRSA by PCR NEGATIVE  NEGATIVE Final    Studies/Results: No results found.  Medications:  Prior to Admission:  Prescriptions prior to admission  Medication Sig Dispense Refill  . amLODipine (NORVASC) 5 MG tablet Take 5 mg by mouth daily.      Marland Kitchen aspirin EC 81 MG tablet Take 81 mg by mouth daily.      . cloNIDine (CATAPRES) 0.1 MG tablet Take 0.1 mg by mouth at bedtime.      . gabapentin (NEURONTIN) 300 MG capsule Take 300 mg by mouth 3 (three) times daily.      Marland Kitchen losartan-hydrochlorothiazide (HYZAAR) 100-25 MG per tablet Take 1 tablet by mouth daily.      . nabumetone (RELAFEN) 500 MG tablet Take 500 mg by mouth 2 (two) times daily.      . simvastatin (ZOCOR) 20 MG tablet Take 20 mg by mouth at bedtime.       Scheduled:   . antiseptic oral rinse  15 mL Mouth Rinse QID  . cefTRIAXone (ROCEPHIN)  IV  1 g Intravenous Q24H  . chlorhexidine  15 mL Mouth Rinse BID  . enoxaparin (LOVENOX) injection  40 mg Subcutaneous Q24H  . insulin aspart  0-9 Units Subcutaneous Q4H  . ipratropium  0.5 mg Nebulization Q4H  . levalbuterol  0.63 mg Nebulization Q4H  . methylPREDNISolone (SOLU-MEDROL) injection  60 mg Intravenous Q12H  . sodium chloride  10-40 mL Intracatheter Q12H  . DISCONTD: insulin glargine  5 Units Subcutaneous QHS  . DISCONTD: methylPREDNISolone (SOLU-MEDROL) injection  80 mg Intravenous Q12H  . DISCONTD: pantoprazole sodium   40 mg Per Tube Q1200   Continuous:   . dextrose 5 % and 0.45 % NaCl with KCl 20 mEq/L 20 mL/hr at 10/21/11 0600  . DISCONTD: feeding supplement (JEVITY 1.2 CAL) Stopped (10/20/11 0800)  . DISCONTD: propofol Stopped (10/20/11 0859)   RUE:[id number], acetaminophen, alum & mag hydroxide-simeth, hydrALAZINE, levalbuterol, LORazepam, ondansetron (ZOFRAN) IV, ondansetron, sodium chloride  Assesment: He has acute respiratory failure with hypercapnia. He is off the ventilator but still at high risk for reintubation. Principal Problem:  *Acute respiratory failure with hypercapnia Active Problems:  COPD with acute exacerbation  Tachycardia  Right Axis Deviation  Anemia  Chronic respiratory failure with hypoxia  Chronic back pain  PNA (pneumonia)  Hypertension  Rhabdomyolysis  Hyperglycemia, drug-induced  Encephalopathy acute  Acute renal failure  Dysphagia    Plan: Continue treatments I would see if we can him up in a chair which may help some with his respiratory status    LOS: 9 days   Kyndal Gloster L 10/21/2011, 8:40 AM

## 2011-10-21 NOTE — Progress Notes (Signed)
Nutrition Follow-up  Intervention:  Add ProStat 30 ml BID  RD to follow for nutrition needs.  Assessment:   Pt extubated and diet advanced following MBSS.  Diet Order: Dys 3 Nectar-Thick liquids  Meds: Scheduled Meds:   . antiseptic oral rinse  15 mL Mouth Rinse QID  . cefTRIAXone (ROCEPHIN)  IV  1 g Intravenous Q24H  . chlorhexidine  15 mL Mouth Rinse BID  . enoxaparin (LOVENOX) injection  40 mg Subcutaneous Q24H  . insulin aspart  0-9 Units Subcutaneous Q4H  . ipratropium  0.5 mg Nebulization Q4H  . levalbuterol  0.63 mg Nebulization Q4H  . methylPREDNISolone (SOLU-MEDROL) injection  60 mg Intravenous Q12H  . pantoprazole (PROTONIX) IV  40 mg Intravenous Daily  . sodium chloride  10-40 mL Intracatheter Q12H   Continuous Infusions:   . dextrose 5 % and 0.45 % NaCl with KCl 20 mEq/L 20 mL/hr at 10/21/11 1000  . dextrose 5 % and 0.9% NaCl 70 mL/hr at 10/21/11 1033   PRN Meds:.acetaminophen, acetaminophen, alum & mag hydroxide-simeth, hydrALAZINE, levalbuterol, LORazepam, ondansetron (ZOFRAN) IV, ondansetron, sodium chloride  Labs:  CMP     Component Value Date/Time   NA 145 10/21/2011 0425   K 4.8 10/21/2011 0425   CL 103 10/21/2011 0425   CO2 36* 10/21/2011 0425   GLUCOSE 111* 10/21/2011 0425   BUN 42* 10/21/2011 0425   CREATININE 1.09 10/21/2011 0425   CALCIUM 10.1 10/21/2011 0425   PROT 6.6 10/15/2011 0445   ALBUMIN 2.9* 10/15/2011 0445   AST 59* 10/15/2011 0445   ALT 43 10/15/2011 0445   ALKPHOS 54 10/15/2011 0445   BILITOT 0.3 10/15/2011 0445   GFRNONAA 64* 10/21/2011 0425   GFRAA 75* 10/21/2011 0425     Intake/Output Summary (Last 24 hours) at 10/21/11 1559 Last data filed at 10/21/11 1530  Gross per 24 hour  Intake    550 ml  Output   2150 ml  Net  -1600 ml    Weight Status: 220.10# (100 kg)  Hx- 710/13- wt 218.7# (99kg)         10/12/11 wt.  207.2 (93.9 kg)  Estimated needs:   1610-9604 kcal/day 110-130 gr protein/day  Nutrition Dx:  Inadequate oral intake  continues  Goal: Pt to meet >/= 90% of their estimated nutrition needs; not met  Monitor:  Diet tol and po intake, add oral supplement if pt is not consuming >75% of most meals.   Royann Shivers MS,RD,LDN Pager: [phone]

## 2011-10-21 NOTE — Procedures (Signed)
Objective Swallowing Evaluation: Modified Barium Swallowing Study  Patient Details  Name: Kaspian Muccio MRN: [id number] Date of Birth: 21-Jun-1934  Today's Date: 10/21/2011 Time: 1440-1506 SLP Time Calculation (min): 26 min  Past Medical History:  Past Medical History  Diagnosis Date  . COPD (chronic obstructive pulmonary disease)   . Hypertension   . Hypercholesteremia   . Stroke   . Chronic respiratory failure with hypoxia   . Chronic back pain   . Elevated CK 10/13/2011   Past Surgical History:  Past Surgical History  Procedure Date  . Fatty tumor     Excision from left shoulder.  . Inguinal hernia repair    HPI:  Mr. Isabel Freese is a 77 yo man who was admitted with respiratory failure, COPD, and PNA. He was intubated shortly after admission and was just extubated yesterday.     Assessment / Plan / Recommendation Clinical Impression  Dysphagia Diagnosis: Mild oral phase dysphagia;Moderate pharyngeal phase dysphagia Clinical impression: Mild oral phase dysphagia and mi/mod pharyngeal phase dysphagia with delay in swallow initiation, decreased tongue base retraction, decreased epiglottic deflection, and overall decreased pharyngeal pressure resulting in penetration of thins without witnessed aspiration however pt coughed and cleared throat frequently and moderate pharyngeal residue with need for repeat/dry swallows to clears. Begin D3/NTL with strict aspiration precautions. Crush meds as able in puree.    Treatment Recommendation  Therapy as outlined in treatment plan below    Diet Recommendation Dysphagia 3 (Mechanical Soft);Nectar-thick liquid   Liquid Administration via: Cup;Straw Medication Administration: Crushed with puree Supervision: Full supervision/cueing for compensatory strategies Compensations: Slow rate;Small sips/bites;Multiple dry swallows after each bite/sip Postural Changes and/or Swallow Maneuvers: Seated upright 90 degrees;Upright 30-60 min after meal      Other  Recommendations Oral Care Recommendations: Oral care BID;Staff/trained caregiver to provide oral care Other Recommendations: Order thickener from pharmacy;Clarify dietary restrictions   Follow Up Recommendations  Skilled Nursing facility    Frequency and Duration min 2x/week  1 week   Pertinent Vitals/Pain     SLP Swallow Goals Patient will consume recommended diet without observed clinical signs of aspiration with: Minimal assistance Patient will utilize recommended strategies during swallow to increase swallowing safety with: Minimal assistance   General Date of Onset: 10/12/11 HPI: Mr. Azeem Poorman is a 77 yo man who was admitted with respiratory failure, COPD, and PNA. He was intubated shortly after admission and was just extubated yesterday. Type of Study: Modified Barium Swallowing Study Reason for Referral: Objectively evaluate swallowing function Previous Swallow Assessment: BSE from yesterday with rec for NPO Diet Prior to this Study: NPO Temperature Spikes Noted: No Respiratory Status: Supplemental O2 delivered via (comment) History of Recent Intubation: Yes Length of Intubations (days): 6 days Date extubated: 10/20/11 Behavior/Cognition: Cooperative;Requires cueing Oral Cavity - Dentition: Missing dentition Oral Motor / Sensory Function: Within functional limits Self-Feeding Abilities: Needs assist Patient Positioning: Upright in chair Baseline Vocal Quality: Breathy;Low vocal intensity Volitional Cough: Weak;Congested Volitional Swallow: Unable to elicit Anatomy: Within functional limits Pharyngeal Secretions: Not observed secondary MBS    Reason for Referral Objectively evaluate swallowing function   Oral Phase  Min impairment  Pharyngeal Phase Pharyngeal Phase: Impaired   Cervical Esophageal Phase Cervical Esophageal Phase: WFL    Danella Philson 10/21/2011, 6:03 PM

## 2011-10-21 NOTE — Progress Notes (Signed)
Speech Language Pathology Dysphagia Treatment Patient Details Name: Harold Johnson MRN: [id number] DOB: May 23, 1934 Today's Date: 10/21/2011 Time: 4098-1191 SLP Time Calculation (min): 20 min  Assessment / Plan / Recommendation Clinical Impression  Mr. Mcclenahan is clinically improved today, however still presents with inconsistent throat clear and cough after liquids. Pt denies any difficulty. MBSS set up for later this afternoon.    Diet Recommendation  Continue with Current Diet: NPO    SLP Plan MBS      Swallowing Goals   Pending results of MBSS to be completed later this afternoon.  General Temperature Spikes Noted: No Respiratory Status: Supplemental O2 delivered via (comment) Behavior/Cognition: Alert;Cooperative;Requires cueing Oral Cavity - Dentition: Missing dentition Patient Positioning: Upright in bed  Oral Cavity - Oral Hygiene Does patient have any of the following "at risk" factors?: Oxygen therapy - cannula, mask, simple oxygen devices;Nutritional status - inadequate Brush patient's teeth BID with toothbrush (using toothpaste with fluoride): Yes Patient is AT RISK - Oral Care Protocol followed (see row info): Yes Patient is mechanically ventilated, follow VAP prevention protocol for oral care: Oral care provided every 4 hours   Dysphagia Treatment Treatment focused on: Upgraded PO texture trials;Other (comment) (assessed for po readiness and rec MBSS) Treatment Methods/Modalities: Skilled observation;Differential diagnosis Patient observed directly with PO's: Yes Type of PO's observed: Dysphagia 1 (puree);Dysphagia 3 (soft);Nectar-thick liquids;Thin liquids;Ice chips Feeding: Needs assist Liquids provided via: Teaspoon;Cup;Straw Pharyngeal Phase Signs & Symptoms: Suspected delayed swallow initiation;Multiple swallows;Delayed throat clear;Delayed cough Type of cueing: Verbal;Tactile;Visual Amount of cueing: Moderate      Thank you,  Havery Moros,  CCC-SLP [phone]  PORTER,DABNEY 10/21/2011, 2:24 PM

## 2011-10-21 NOTE — Progress Notes (Signed)
CRITICAL VALUE ALERT  Critical value received: ABG   Date of notification:  10/21/11  Time of notification:  1014  Critical value read back: yes  Nurse who received alert:  Lovena Neighbours RN  MD notified (1st page):  Dr Theador Hawthorne  Time of first page:  1015  MD notified (2nd page): Dr Theador Hawthorne  Time of second page:1045  Responding MD:  Dr Sherrie Mustache  Time MD responded:

## 2011-10-21 NOTE — Progress Notes (Signed)
Subjective: The patient is sleeping. He is difficult to arouse but is arousable. His speech is garbled.  Objective: Vital signs in last 24 hours: Filed Vitals:   10/21/11 0500 10/21/11 0600 10/21/11 0708 10/21/11 0800  BP: 143/77 148/71    Pulse: 90 90    Temp:    98.8 F (37.1 C)  TempSrc:    Axillary  Resp: 23 23    Height:      Weight: 100.1 kg (220 lb 10.9 oz)     SpO2: 97% 97% 98%     Intake/Output Summary (Last 24 hours) at 10/21/11 0905 Last data filed at 10/21/11 0600  Gross per 24 hour  Intake    590 ml  Output   2100 ml  Net  -1510 ml    Weight change: 1.6 kg (3 lb 8.4 oz)  Physical exam: General: Lethargic 77 year old after American man who appears much more debilitated than he appeared on admission. Lungs: Few congested crackles. Breathing nonlabored. Heart: S1, S2, with no murmurs rubs or gallops. Abdomen: Hypoactive bowel sounds, soft, nontender, nondistended. Extremities: No pedal edema. Neurologic: He is lethargic. He is able to answer to his name. He says that he is in the hospital. He squeezes the examiner's hand with his left hand but did not squeeze with his right hand. He was able to wiggle toes only minimally. Could not reliably assess his cranial nerves.   Lab Results: Basic Metabolic Panel:  Basename 10/21/11 0425 10/19/11 0528  NA 145 --  K 4.8 --  CL 103 --  CO2 36* --  GLUCOSE 111* --  BUN 42* --  CREATININE 1.09 1.32  CALCIUM 10.1 --  MG -- --  PHOS -- --   Liver Function Tests: No results found for this basename: AST:2,ALT:2,ALKPHOS:2,BILITOT:2,PROT:2,ALBUMIN:2 in the last 72 hours No results found for this basename: LIPASE:2,AMYLASE:2 in the last 72 hours No results found for this basename: AMMONIA:2 in the last 72 hours CBC:  Basename 10/20/11 0432  WBC 11.8*  NEUTROABS --  HGB 11.4*  HCT 37.4*  MCV 95.2  PLT 183   Cardiac Enzymes: No results found for this basename: CKTOTAL:3,CKMB:3,CKMBINDEX:3,TROPONINI:3 in the last  72 hours BNP: No results found for this basename: PROBNP:3 in the last 72 hours D-Dimer: No results found for this basename: DDIMER:2 in the last 72 hours CBG:  Basename 10/21/11 0724 10/21/11 0411 10/20/11 2330 10/20/11 1930 10/20/11 1603 10/20/11 1132  GLUCAP 84 98 101* 92 106* 110*   Hemoglobin A1C: No results found for this basename: HGBA1C in the last 72 hours Fasting Lipid Panel:  Basename 10/20/11 0432  CHOL --  HDL --  LDLCALC --  TRIG 146  CHOLHDL --  LDLDIRECT --   Thyroid Function Tests: No results found for this basename: TSH,T4TOTAL,FREET4,T3FREE,THYROIDAB in the last 72 hours Anemia Panel: No results found for this basename: VITAMINB12,FOLATE,FERRITIN,TIBC,IRON,RETICCTPCT in the last 72 hours Coagulation: No results found for this basename: LABPROT:2,INR:2 in the last 72 hours Urine Drug Screen: Drugs of Abuse  No results found for this basename: labopia, cocainscrnur, labbenz, amphetmu, thcu, labbarb    Alcohol Level: No results found for this basename: ETH:2 in the last 72 hours Urinalysis: No results found for this basename: COLORURINE:2,APPERANCEUR:2,LABSPEC:2,PHURINE:2,GLUCOSEU:2,HGBUR:2,BILIRUBINUR:2,KETONESUR:2,PROTEINUR:2,UROBILINOGEN:2,NITRITE:2,LEUKOCYTESUR:2 in the last 72 hours Misc. Labs:   Micro: Recent Results (from the past 240 hour(s))  CULTURE, BLOOD (ROUTINE X 2)     Status: Normal   Collection Time   10/12/11  9:59 AM      Component Value Range  Status Comment   Specimen Description BLOOD LEFT WRIST   Final    Special Requests BOTTLES DRAWN AEROBIC AND ANAEROBIC 8CC   Final    Culture NO GROWTH 5 DAYS   Final    Report Status 10/17/2011 FINAL   Final   CULTURE, BLOOD (ROUTINE X 2)     Status: Normal   Collection Time   10/12/11 10:03 AM      Component Value Range Status Comment   Specimen Description BLOOD RIGHT HAND   Final    Special Requests BOTTLES DRAWN AEROBIC AND ANAEROBIC 8CC   Final    Culture NO GROWTH 5 DAYS   Final     Report Status 10/17/2011 FINAL   Final   URINE CULTURE     Status: Normal   Collection Time   10/12/11 10:19 AM      Component Value Range Status Comment   Specimen Description URINE, CLEAN CATCH   Final    Special Requests NONE   Final    Culture  Setup Time 10/12/2011 19:39   Final    Colony Count NO GROWTH   Final    Culture NO GROWTH   Final    Report Status 10/14/2011 FINAL   Final   MRSA PCR SCREENING     Status: Normal   Collection Time   10/12/11  3:21 PM      Component Value Range Status Comment   MRSA by PCR NEGATIVE  NEGATIVE Final     Studies/Results: No results found.  Medications:  Scheduled:   . antiseptic oral rinse  15 mL Mouth Rinse QID  . cefTRIAXone (ROCEPHIN)  IV  1 g Intravenous Q24H  . chlorhexidine  15 mL Mouth Rinse BID  . enoxaparin (LOVENOX) injection  40 mg Subcutaneous Q24H  . insulin aspart  0-9 Units Subcutaneous Q4H  . ipratropium  0.5 mg Nebulization Q4H  . levalbuterol  0.63 mg Nebulization Q4H  . methylPREDNISolone (SOLU-MEDROL) injection  60 mg Intravenous Q12H  . sodium chloride  10-40 mL Intracatheter Q12H  . DISCONTD: insulin glargine  5 Units Subcutaneous QHS  . DISCONTD: methylPREDNISolone (SOLU-MEDROL) injection  80 mg Intravenous Q12H  . DISCONTD: pantoprazole sodium  40 mg Per Tube Q1200   Continuous:   . dextrose 5 % and 0.45 % NaCl with KCl 20 mEq/L 20 mL/hr at 10/21/11 0600  . DISCONTD: feeding supplement (JEVITY 1.2 CAL) Stopped (10/20/11 0800)  . DISCONTD: propofol Stopped (10/20/11 0859)   ZOX:[id number], acetaminophen, alum & mag hydroxide-simeth, hydrALAZINE, levalbuterol, LORazepam, ondansetron (ZOFRAN) IV, ondansetron, sodium chloride  Assessment: Principal Problem:  *Acute respiratory failure with hypercapnia Active Problems:  Chronic respiratory failure with hypoxia  PNA (pneumonia)  COPD with acute exacerbation  Tachycardia  Right Axis Deviation  Anemia  Chronic back pain  Hypertension  Rhabdomyolysis   Hyperglycemia, drug-induced  Encephalopathy acute  Acute renal failure  Dysphagia  Lethargy    1. Acute respiratory failure with hypercapnia superimposed on chronic respiratory failure with hypoxia. He was extubated successfully yesterday. Will continue steroids and oxygen as ordered. Because of his lethargy, will order another ABG to assess for worsening hypercapnia.  COPD with exacerbation and pneumonia. He remains on tapering steroids. He remains on Rocephin. Azithromycin was discontinued after 6 days of therapy.  Lethargy. We'll investigate further.  Dysphagia. The assessment and recommendations by the speech therapist noted and appreciated. MBS pending when appropriate.   Plan: 1. For further evaluation of lethargy, we'll order an ammonia level, ABG, and  CT scan of the head. 2. MBS when the patient is more alert to further assess for dysphagia/aspiration. 3. Will start maintenance IV fluids with dextrose while holding the tube feeding.    Total critical care time: 40 minutes.    LOS: 9 days   Harold Johnson 10/21/2011, 9:05 AM

## 2011-10-22 DIAGNOSIS — R5381 Other malaise: Secondary | ICD-10-CM

## 2011-10-22 DIAGNOSIS — E870 Hyperosmolality and hypernatremia: Secondary | ICD-10-CM | POA: Diagnosis not present

## 2011-10-22 LAB — BASIC METABOLIC PANEL
Calcium: 10.2 mg/dL (ref 8.4–10.5)
GFR calc non Af Amer: 61 mL/min — ABNORMAL LOW (ref >90)
Glucose, Bld: 130 mg/dL — ABNORMAL HIGH (ref 70–99)
Sodium: 147 mEq/L — ABNORMAL HIGH (ref 135–145)

## 2011-10-22 LAB — GLUCOSE, CAPILLARY
Glucose-Capillary: 131 mg/dL — ABNORMAL HIGH (ref 70–99)
Glucose-Capillary: 132 mg/dL — ABNORMAL HIGH (ref 70–99)
Glucose-Capillary: 154 mg/dL — ABNORMAL HIGH (ref 70–99)
Glucose-Capillary: 130 mg/dL — ABNORMAL HIGH (ref 70–99)

## 2011-10-22 MED ORDER — AMLODIPINE BESYLATE 5 MG PO TABS
5.0000 mg | ORAL_TABLET | Freq: Every day | ORAL | Status: DC
  Administered 2011-10-22 – 2011-10-24 (×3): 5 mg via ORAL
  Filled 2011-10-22 (×3): qty 1

## 2011-10-22 MED ORDER — CLONIDINE HCL 0.1 MG PO TABS
0.1000 mg | ORAL_TABLET | Freq: Two times a day (BID) | ORAL | Status: DC
  Administered 2011-10-22 – 2011-10-24 (×5): 0.1 mg via ORAL
  Filled 2011-10-22 (×5): qty 1

## 2011-10-22 MED ORDER — DEXTROSE-NACL 5-0.45 % IV SOLN
INTRAVENOUS | Status: DC
  Administered 2011-10-22 – 2011-10-24 (×5): via INTRAVENOUS

## 2011-10-22 MED ORDER — PREDNISONE 20 MG PO TABS
60.0000 mg | ORAL_TABLET | Freq: Every day | ORAL | Status: DC
  Administered 2011-10-22 – 2011-10-24 (×3): 60 mg via ORAL
  Filled 2011-10-22 (×3): qty 3

## 2011-10-22 MED ORDER — SODIUM CHLORIDE 0.9 % IJ SOLN
INTRAMUSCULAR | Status: AC
  Filled 2011-10-22: qty 3

## 2011-10-22 MED ORDER — LOSARTAN POTASSIUM 50 MG PO TABS
50.0000 mg | ORAL_TABLET | Freq: Every day | ORAL | Status: DC
  Administered 2011-10-22: 50 mg via ORAL
  Filled 2011-10-22: qty 1

## 2011-10-22 NOTE — Evaluation (Signed)
Physical Therapy Evaluation Patient Details Name: Harold Johnson MRN: [id number] DOB: Dec 17, 1934 Today's Date: 10/22/2011 Time: 4098-1191 PT Time Calculation (min): 15 min  PT Assessment / Plan / Recommendation Clinical Impression  Pt exubated on 10/20/11.  Pt was I with cane at home prior to recent illness. At this time pt has significant weakness and decreased mobility and would benefit from skilled OT and PT services to return pt to maximal functional potential.    PT Assessment  Patient needs continued PT services    Follow Up Recommendations  Skilled nursing facility    Barriers to Discharge None Pt will not be able to return to home safely; he will need SNF    Equipment Recommendations  Defer to next venue    Recommendations for Other Services OT consult   Frequency Min 3X/week    Precautions / Restrictions Precautions Precautions: Fall       Mobility  Bed Mobility Details for Bed Mobility Assistance: not tested pt had been mechanically lifted into chair Transfers Transfer via Lift Equipment: Maximove    Exercises General Exercises - Lower Extremity Ankle Circles/Pumps: AROM;10 reps Short Arc Quad: 10 reps;AAROM Heel Slides: AAROM;10 reps Hip ABduction/ADduction: AAROM;10 reps   PT Diagnosis: Difficulty walking;Generalized weakness  PT Problem List: Decreased knowledge of use of DME;Decreased strength;Decreased activity tolerance;Decreased mobility;Decreased coordination;Decreased safety awareness PT Treatment Interventions: Functional mobility training;Therapeutic activities;Therapeutic exercise;Balance training;Patient/family education   PT Goals Acute Rehab PT Goals PT Goal Formulation: Patient unable to participate in goal setting Potential to Achieve Goals: Good Pt will Roll Supine to Right Side: with min assist Pt will Roll Supine to Left Side: with min assist PT Goal: Rolling Supine to Left Side - Progress: Goal set today Pt will go Supine/Side to Sit: with  mod assist PT Goal: Supine/Side to Sit - Progress: Goal set today Pt will Sit at Edge of Bed: with mod assist;with max assist;3-5 min PT Goal: Sit at Edge Of Bed - Progress: Goal set today Pt will go Sit to Supine/Side: with max assist;with +2 total assist PT Goal: Sit to Supine/Side - Progress: Goal set today Pt will go Sit to Stand: with mod assist PT Goal: Sit to Stand - Progress: Goal set today Pt will Stand: with min assist;1 - 2 min PT Goal: Stand - Progress: Goal set today  Visit Information  Last PT Received On: 10/22/11    Subjective Data  Subjective: Pt is very lethargic.  Able to state that he lives with his sister and normally uses a cane to walk with. Patient Stated Goal: did not verbalize   Prior Functioning  Home Living Lives With: Daughter Available Help at Discharge: Family Type of Home: House Home Access: Stairs to enter Secretary/administrator of Steps: 3 Entrance Stairs-Rails: Right Home Layout: One level Bathroom Shower/Tub: Engineer, manufacturing systems: Standard Home Adaptive Equipment: Straight cane Prior Function Level of Independence: Independent with assistive device(s) Able to Take Stairs?: Yes Vocation: Retired Musician: No difficulties    Cognition  Overall Cognitive Status: Appears within functional limits for tasks assessed/performed Arousal/Alertness: Lethargic Orientation Level: Disoriented to;Place;Situation Behavior During Session: WFL for tasks performed    Extremity/Trunk Assessment Right Lower Extremity Assessment RLE ROM/Strength/Tone: Deficits RLE ROM/Strength/Tone Deficits: strength generally 2-/5 Left Lower Extremity Assessment LLE ROM/Strength/Tone: Deficits LLE ROM/Strength/Tone Deficits: strength generally 2-/5   Balance    End of Session PT - End of Session Activity Tolerance: Patient limited by fatigue Patient left: in chair;with call bell/phone within reach;with nursing in room  GP      RUSSELL,CINDY 10/22/2011, 12:10 PM

## 2011-10-22 NOTE — Progress Notes (Signed)
Subjective: The patient is sitting up in bed. He is clearly more alert and communicative. He has no complaints of pain or shortness of breath.  Objective: Vital signs in last 24 hours: Filed Vitals:   10/22/11 0600 10/22/11 0700 10/22/11 0722 10/22/11 0800  BP: 167/98 173/97  169/95  Pulse: 91 95  94  Temp:      TempSrc:      Resp: 21 22  23   Height:      Weight:      SpO2: 99% 97% 98% 99%    Intake/Output Summary (Last 24 hours) at 10/22/11 0925 Last data filed at 10/22/11 0800  Gross per 24 hour  Intake 1561.5 ml  Output   2000 ml  Net -438.5 ml    Weight change: -1.3 kg (-2 lb 13.9 oz)  Physical exam: General: 77 year old African-American man sitting up in bed, more alert, but appears debilitated. He Lungs: Few fine wheezes. Breathing nonlabored. Heart: S1, S2, with no murmurs rubs or gallops. Abdomen: Hypoactive bowel sounds, soft, nontender, nondistended. Extremities: No pedal edema. Neurologic: He is more alert, but appears debilitated. He is oriented to himself and hospital. He follows small commands. His speech is much clearer. Cranial nerves II through XII are grossly intact.   Lab Results: Basic Metabolic Panel:  Basename 10/22/11 0428 10/21/11 0425  NA 147* 145  K 4.8 4.8  CL 104 103  CO2 36* 36*  GLUCOSE 130* 111*  BUN 41* 42*  CREATININE 1.14 1.09  CALCIUM 10.2 10.1  MG -- --  PHOS -- --   Liver Function Tests: No results found for this basename: AST:2,ALT:2,ALKPHOS:2,BILITOT:2,PROT:2,ALBUMIN:2 in the last 72 hours No results found for this basename: LIPASE:2,AMYLASE:2 in the last 72 hours  Basename 10/21/11 1012  AMMONIA 17   CBC:  Basename 10/20/11 0432  WBC 11.8*  NEUTROABS --  HGB 11.4*  HCT 37.4*  MCV 95.2  PLT 183   Cardiac Enzymes: No results found for this basename: CKTOTAL:3,CKMB:3,CKMBINDEX:3,TROPONINI:3 in the last 72 hours BNP: No results found for this basename: PROBNP:3 in the last 72 hours D-Dimer: No results found  for this basename: DDIMER:2 in the last 72 hours CBG:  Basename 10/22/11 0733 10/22/11 0348 10/21/11 2310 10/21/11 1639 10/21/11 1129 10/21/11 0724  GLUCAP 131* 122* 114* 134* 106* 84   Hemoglobin A1C: No results found for this basename: HGBA1C in the last 72 hours Fasting Lipid Panel:  Basename 10/20/11 0432  CHOL --  HDL --  LDLCALC --  TRIG 146  CHOLHDL --  LDLDIRECT --   Thyroid Function Tests: No results found for this basename: TSH,T4TOTAL,FREET4,T3FREE,THYROIDAB in the last 72 hours Anemia Panel: No results found for this basename: VITAMINB12,FOLATE,FERRITIN,TIBC,IRON,RETICCTPCT in the last 72 hours Coagulation: No results found for this basename: LABPROT:2,INR:2 in the last 72 hours Urine Drug Screen: Drugs of Abuse  No results found for this basename: labopia,  cocainscrnur,  labbenz,  amphetmu,  thcu,  labbarb    Alcohol Level: No results found for this basename: ETH:2 in the last 72 hours Urinalysis: No results found for this basename: COLORURINE:2,APPERANCEUR:2,LABSPEC:2,PHURINE:2,GLUCOSEU:2,HGBUR:2,BILIRUBINUR:2,KETONESUR:2,PROTEINUR:2,UROBILINOGEN:2,NITRITE:2,LEUKOCYTESUR:2 in the last 72 hours Misc. Labs:   Micro: Recent Results (from the past 240 hour(s))  CULTURE, BLOOD (ROUTINE X 2)     Status: Normal   Collection Time   10/12/11  9:59 AM      Component Value Range Status Comment   Specimen Description BLOOD LEFT WRIST   Final    Special Requests BOTTLES DRAWN AEROBIC AND ANAEROBIC 8CC  Final    Culture NO GROWTH 5 DAYS   Final    Report Status 10/17/2011 FINAL   Final   CULTURE, BLOOD (ROUTINE X 2)     Status: Normal   Collection Time   10/12/11 10:03 AM      Component Value Range Status Comment   Specimen Description BLOOD RIGHT HAND   Final    Special Requests BOTTLES DRAWN AEROBIC AND ANAEROBIC 8CC   Final    Culture NO GROWTH 5 DAYS   Final    Report Status 10/17/2011 FINAL   Final   URINE CULTURE     Status: Normal   Collection Time   10/12/11  10:19 AM      Component Value Range Status Comment   Specimen Description URINE, CLEAN CATCH   Final    Special Requests NONE   Final    Culture  Setup Time 10/12/2011 19:39   Final    Colony Count NO GROWTH   Final    Culture NO GROWTH   Final    Report Status 10/14/2011 FINAL   Final   MRSA PCR SCREENING     Status: Normal   Collection Time   10/12/11  3:21 PM      Component Value Range Status Comment   MRSA by PCR NEGATIVE  NEGATIVE Final     Studies/Results: Ct Head Wo Contrast  10/21/2011  *RADIOLOGY REPORT*  Clinical Data: Lethargy.  COPD  CT HEAD WITHOUT CONTRAST  Technique:  Contiguous axial images were obtained from the base of the skull through the vertex without contrast.  Comparison: None.  Findings: Multiple images were repeated due to motion.  Suboptimal image quality.  Ventricle size is normal.  Negative for acute infarct.  Probable mild chronic microvascular ischemia in the frontal white matter. Negative for hemorrhage or mass.  Sinusitis with air-fluid level in the right maxillary sinus. Mucosal edema in the ethmoid and sphenoid sinuses.  IMPRESSION: No acute intracranial abnormality.  Sinusitis with air-fluid level on the right.  Original Report Authenticated By: Camelia Phenes, M.D.   Dg Swallowing Func-no Report  10/21/2011  CLINICAL DATA: dysphagia   FLUOROSCOPY FOR SWALLOWING FUNCTION STUDY:  Fluoroscopy was provided for swallowing function study, which was  administered by a speech pathologist.  Final results and recommendations  from this study are contained within the speech pathology report.      Medications:  Scheduled:    . amLODipine  5 mg Oral Daily  . antiseptic oral rinse  15 mL Mouth Rinse QID  . cefTRIAXone (ROCEPHIN)  IV  1 g Intravenous Q24H  . chlorhexidine  15 mL Mouth Rinse BID  . cloNIDine  0.1 mg Oral BID  . enoxaparin (LOVENOX) injection  40 mg Subcutaneous Q24H  . feeding supplement  30 mL Oral BID BM  . insulin aspart  0-9 Units Subcutaneous  Q4H  . ipratropium  0.5 mg Nebulization Q4H  . levalbuterol  0.63 mg Nebulization Q4H  . losartan  50 mg Oral Daily  . methylPREDNISolone (SOLU-MEDROL) injection  60 mg Intravenous Q12H  . pantoprazole (PROTONIX) IV  40 mg Intravenous Daily  . sodium chloride  10-40 mL Intracatheter Q12H   Continuous:    . dextrose 5 % and 0.45% NaCl 70 mL/hr at 10/22/11 0857  . DISCONTD: dextrose 5 % and 0.45 % NaCl with KCl 20 mEq/L 20 mL/hr at 10/21/11 1000  . DISCONTD: dextrose 5 % and 0.9% NaCl 70 mL/hr at 10/22/11 [phone]  JWJ:XBJYNWGNFAOZH, acetaminophen, alum & mag hydroxide-simeth, hydrALAZINE, levalbuterol, LORazepam, ondansetron (ZOFRAN) IV, ondansetron, RESOURCE THICKENUP CLEAR, sodium chloride  Assessment: Principal Problem:  *Acute respiratory failure with hypercapnia Active Problems:  Chronic respiratory failure with hypoxia  PNA (pneumonia)  COPD with acute exacerbation  Tachycardia  Right Axis Deviation  Anemia  Chronic back pain  Hypertension  Rhabdomyolysis  Hyperglycemia, drug-induced  Encephalopathy acute  Acute renal failure  Dysphagia  Lethargy  Sinusitis  Hypernatremia  Debility    1. Acute respiratory failure with hypercapnia superimposed on chronic respiratory failure with hypoxia. He was extubated successfully on 10/20/2011. Will continue steroids and oxygen as ordered. His ABG yesterday did reveal hypercapnia, but I believe this will improve with ongoing treatment.  COPD with exacerbation and pneumonia. He remains on tapering steroids. He remains on Rocephin. Azithromycin was discontinued after 6 days of therapy. He is afebrile.  Lethargy. Resolved. CT of the head yesterday revealed no acute findings. His ammonia level was within normal limits. His ABG revealed mild hypercapnia, but he is much more alert today.  Dysphagia. The assessment and recommendations by the speech therapist, following the MBS. We'll continue dysphagia 3 diet with nectar thickened liquids  as ordered.  Hypertension. His blood pressure has increased. Will restart his antihypertensive medications and titrate accordingly.  Hypernatremia. Likely secondary to recent hypertonic tube feedings. Will adjust IV fluids accordingly.  Debility. It is likely that the patient will need short-term skilled nursing facility placement or LTAC environment. Agree with physical therapy consultation.  Plan: 1. We'll change IV fluids to half-normal saline. 2. Followup on physical therapy evaluation and recommendations. 3. Continue supportive treatment and the step down setting. 4. Will change steroids to oral prednisone. 5. Restart some of his antihypertensive medications.       LOS: 10 days   Abrianna Sidman 10/22/2011, 9:25 AM

## 2011-10-22 NOTE — Evaluation (Signed)
Occupational Therapy Evaluation Patient Details Name: Kamareon Sciandra MRN: [id number] DOB: 07-24-1934 Today's Date: 10/22/2011 Time: 4098-1191 OT Time Calculation (min): 27 min  OT Assessment / Plan / Recommendation Clinical Impression  Patient is a 77 y/o male s/p Acute Respiratory Failure with Hypercania presenting to acute OT with deficits below. Patient will benefit from acute OT to increase strength and endurance before transferring to a SNF for continued therapy. Patient is extremely deconditioned and fatigues very quickly. Patient will not be able to return home at this time and will benefit from a SNF.     OT Assessment  Patient needs continued OT Services    Follow Up Recommendations  Skilled nursing facility    Barriers to Discharge Decreased caregiver support Wife will be unable to assist patient at the physical level that he requires at this time.  Equipment Recommendations  Defer to next venue       Frequency  Min 2X/week    Precautions / Restrictions Precautions Precautions: Fall Restrictions Weight Bearing Restrictions: No   Pertinent Vitals/Pain No report of pain.    ADL  Transfers/Ambulation Related to ADLs: Patient transfers with [facility]. Patient already up in chair when therapy arrived and transfer could not be performed. ADL Comments: Patient is Total Assist for all BADL.     OT Diagnosis: Generalized weakness  OT Problem List: Decreased strength;Decreased range of motion;Decreased activity tolerance;Decreased safety awareness;Decreased cognition;Decreased coordination;Impaired UE functional use;Increased edema;Impaired sensation;Impaired balance (sitting and/or standing);Decreased knowledge of use of DME or AE;Decreased knowledge of precautions OT Treatment Interventions: Self-care/ADL training;Therapeutic activities;Therapeutic exercise;Energy conservation;DME and/or AE instruction;Patient/family education;Balance training   OT Goals Acute Rehab OT Goals OT  Goal Formulation: With patient Time For Goal Achievement: 11/05/11 Potential to Achieve Goals: Fair ADL Goals Pt Will Perform Grooming: with max assist;Sitting, chair ADL Goal: Grooming - Progress: Goal set today Pt Will Perform Upper Body Bathing: with max assist;Sitting, chair ADL Goal: Upper Body Bathing - Progress: Goal set today Pt Will Perform Lower Body Bathing: with max assist;Supine, rolling right and/or left;Supine, head of bed up ADL Goal: Lower Body Bathing - Progress: Goal set today Pt Will Perform Upper Body Dressing: with max assist;Sitting, chair ADL Goal: Upper Body Dressing - Progress: Goal set today Pt Will Perform Lower Body Dressing: with max assist;Supine, rolling right and/or left;Supine, head of bed up ADL Goal: Lower Body Dressing - Progress: Goal set today Pt Will Transfer to Toilet: with 1+ total assist;Squat pivot transfer;3-in-1 ADL Goal: Toilet Transfer - Progress: Goal set today Arm Goals Pt Will Tolerate PROM: Bilateral upper extremities;1 set;10 reps (to decrease edema in Bil UE.) Arm Goal: PROM - Progress: Goal set today Additional Arm Goal #1: Patient will performed AAROM Bil UE to increase strength and endurance and functional performance during UB bathing and dressing. Arm Goal: Additional Goal #1 - Progress: Goal set today  Visit Information  Last OT Received On: 10/22/11 Assistance Needed: +2    Subjective Data  Subjective: "It's October." Patient Stated Goal: To go home.   Prior Functioning  Vision/Perception  Home Living Lives With: Spouse Available Help at Discharge: Family Type of Home: House Home Access: Stairs to enter Secretary/administrator of Steps: 3 Entrance Stairs-Rails: Right Home Layout: One level Bathroom Shower/Tub: Forensic scientist: Standard Home Adaptive Equipment: Straight cane Prior Function Level of Independence: Independent with assistive device(s) Able to Take Stairs?: Yes Vocation:  Retired Musician: No difficulties Dominant Hand: Right   Vision - Assessment Eye Alignment: Within Functional Limits  Cognition  Overall Cognitive Status: Impaired Area of Impairment: Attention;Following commands Arousal/Alertness: Lethargic Orientation Level: Person Behavior During Session: Lethargic Current Attention Level: Alternating Following Commands: Follows one step commands inconsistently    Extremity/Trunk Assessment Right Upper Extremity Assessment RUE ROM/Strength/Tone: Deficits RUE ROM/Strength/Tone Deficits: P/ROM RUE WFL in all ranges. A/ROM RUE less than full range due to edema in Bil UE. MMT: 3-/5. RUE Sensation: Deficits RUE Coordination: Deficits RUE Coordination Deficits: Impaired gross/fine motor due to edema. Left Upper Extremity Assessment LUE ROM/Strength/Tone: Deficits LUE ROM/Strength/Tone Deficits: P/ROM LUE WFL in all ranges. A/ROM LUE less than full range due to edema in Bil UE. MMT: 3-/5. LUE Sensation: Deficits LUE Coordination: Deficits LUE Coordination Deficits: Impaired gross/fine motor due to edema.    Mobility Bed Mobility Details for Bed Mobility Assistance: not tested pt had been mechanically lifted into chair Transfers Transfer via Lift Equipment: Maximove   Exercise General Exercises - Upper Extremity Shoulder Flexion: AAROM;Both;5 reps;Seated Shoulder Extension: AAROM;Both;5 reps;Seated Elbow Flexion: AAROM;Both;5 reps;Seated Elbow Extension: AAROM;Both;5 reps;Seated Wrist Flexion: PROM;Both;5 reps;Seated Wrist Extension: PROM;Both;5 reps;Seated Digit Composite Flexion: PROM;Both;5 reps;Seated Composite Extension: PROM;Both;5 reps;Seated      End of Session OT - End of Session Activity Tolerance: Patient limited by fatigue Patient left: in chair;with call bell/phone within reach;with family/visitor present    Limmie Patricia, OTR/L  10/22/2011, 2:35 PM

## 2011-10-22 NOTE — Progress Notes (Signed)
Subjective: He looks better than he did yesterday. He actually looks the best I've seen him. He is more awake and alert and communicative  Objective: Vital signs in last 24 hours: Temp:  [97.8 F (36.6 C)-98.5 F (36.9 C)] 98.1 F (36.7 C) (07/17 0400) Pulse Rate:  [79-102] 91  (07/17 0600) Resp:  [15-24] 21  (07/17 0600) BP: (118-187)/(75-111) 167/98 mmHg (07/17 0600) SpO2:  [93 %-99 %] 98 % (07/17 0722) Weight:  [98.8 kg (217 lb 13 oz)] 98.8 kg (217 lb 13 oz) (07/17 0500) Weight change: -1.3 kg (-2 lb 13.9 oz) Last BM Date: 10/19/11  Intake/Output from previous day: 07/16 0701 - 07/17 0700 In: 1471.5 [I.V.:1421.5; IV Piggyback:50] Out: 2000 [Urine:2000]  PHYSICAL EXAM General appearance: alert, cooperative and mild distress Resp: rhonchi bilaterally Cardio: regular rate and rhythm, S1, S2 normal, no murmur, click, rub or gallop GI: soft, non-tender; bowel sounds normal; no masses,  no organomegaly Extremities: extremities normal, atraumatic, no cyanosis or edema  Lab Results:    Basic Metabolic Panel:  Basename 10/22/11 0428 10/21/11 0425  NA 147* 145  K 4.8 4.8  CL 104 103  CO2 36* 36*  GLUCOSE 130* 111*  BUN 41* 42*  CREATININE 1.14 1.09  CALCIUM 10.2 10.1  MG -- --  PHOS -- --   Liver Function Tests: No results found for this basename: AST:2,ALT:2,ALKPHOS:2,BILITOT:2,PROT:2,ALBUMIN:2 in the last 72 hours No results found for this basename: LIPASE:2,AMYLASE:2 in the last 72 hours  Basename 10/21/11 1012  AMMONIA 17   CBC:  Basename 10/20/11 0432  WBC 11.8*  NEUTROABS --  HGB 11.4*  HCT 37.4*  MCV 95.2  PLT 183   Cardiac Enzymes: No results found for this basename: CKTOTAL:3,CKMB:3,CKMBINDEX:3,TROPONINI:3 in the last 72 hours BNP: No results found for this basename: PROBNP:3 in the last 72 hours D-Dimer: No results found for this basename: DDIMER:2 in the last 72 hours CBG:  Basename 10/22/11 0733 10/22/11 0348 10/21/11 2310 10/21/11 1639  10/21/11 1129 10/21/11 0724  GLUCAP 131* 122* 114* 134* 106* 84   Hemoglobin A1C: No results found for this basename: HGBA1C in the last 72 hours Fasting Lipid Panel:  Basename 10/20/11 0432  CHOL --  HDL --  LDLCALC --  TRIG 146  CHOLHDL --  LDLDIRECT --   Thyroid Function Tests: No results found for this basename: TSH,T4TOTAL,FREET4,T3FREE,THYROIDAB in the last 72 hours Anemia Panel: No results found for this basename: VITAMINB12,FOLATE,FERRITIN,TIBC,IRON,RETICCTPCT in the last 72 hours Coagulation: No results found for this basename: LABPROT:2,INR:2 in the last 72 hours Urine Drug Screen: Drugs of Abuse  No results found for this basename: labopia, cocainscrnur, labbenz, amphetmu, thcu, labbarb    Alcohol Level: No results found for this basename: ETH:2 in the last 72 hours Urinalysis: No results found for this basename: COLORURINE:2,APPERANCEUR:2,LABSPEC:2,PHURINE:2,GLUCOSEU:2,HGBUR:2,BILIRUBINUR:2,KETONESUR:2,PROTEINUR:2,UROBILINOGEN:2,NITRITE:2,LEUKOCYTESUR:2 in the last 72 hours Misc. Labs:  ABGS  Basename 10/21/11 1004  PHART 7.339*  PO2ART 83.6  TCO2 34.4  HCO3 37.6*   CULTURES Recent Results (from the past 240 hour(s))  CULTURE, BLOOD (ROUTINE X 2)     Status: Normal   Collection Time   10/12/11  9:59 AM      Component Value Range Status Comment   Specimen Description BLOOD LEFT WRIST   Final    Special Requests BOTTLES DRAWN AEROBIC AND ANAEROBIC 8CC   Final    Culture NO GROWTH 5 DAYS   Final    Report Status 10/17/2011 FINAL   Final   CULTURE, BLOOD (ROUTINE X 2)  Status: Normal   Collection Time   10/12/11 10:03 AM      Component Value Range Status Comment   Specimen Description BLOOD RIGHT HAND   Final    Special Requests BOTTLES DRAWN AEROBIC AND ANAEROBIC 8CC   Final    Culture NO GROWTH 5 DAYS   Final    Report Status 10/17/2011 FINAL   Final   URINE CULTURE     Status: Normal   Collection Time   10/12/11 10:19 AM      Component Value Range  Status Comment   Specimen Description URINE, CLEAN CATCH   Final    Special Requests NONE   Final    Culture  Setup Time 10/12/2011 19:39   Final    Colony Count NO GROWTH   Final    Culture NO GROWTH   Final    Report Status 10/14/2011 FINAL   Final   MRSA PCR SCREENING     Status: Normal   Collection Time   10/12/11  3:21 PM      Component Value Range Status Comment   MRSA by PCR NEGATIVE  NEGATIVE Final    Studies/Results: Ct Head Wo Contrast  10/21/2011  *RADIOLOGY REPORT*  Clinical Data: Lethargy.  COPD  CT HEAD WITHOUT CONTRAST  Technique:  Contiguous axial images were obtained from the base of the skull through the vertex without contrast.  Comparison: None.  Findings: Multiple images were repeated due to motion.  Suboptimal image quality.  Ventricle size is normal.  Negative for acute infarct.  Probable mild chronic microvascular ischemia in the frontal white matter. Negative for hemorrhage or mass.  Sinusitis with air-fluid level in the right maxillary sinus. Mucosal edema in the ethmoid and sphenoid sinuses.  IMPRESSION: No acute intracranial abnormality.  Sinusitis with air-fluid level on the right.  Original Report Authenticated By: Camelia Phenes, M.D.   Dg Swallowing Func-no Report  10/21/2011  CLINICAL DATA: dysphagia   FLUOROSCOPY FOR SWALLOWING FUNCTION STUDY:  Fluoroscopy was provided for swallowing function study, which was  administered by a speech pathologist.  Final results and recommendations  from this study are contained within the speech pathology report.      Medications:  Scheduled:   . amLODipine  5 mg Oral Daily  . antiseptic oral rinse  15 mL Mouth Rinse QID  . cefTRIAXone (ROCEPHIN)  IV  1 g Intravenous Q24H  . chlorhexidine  15 mL Mouth Rinse BID  . cloNIDine  0.1 mg Oral BID  . enoxaparin (LOVENOX) injection  40 mg Subcutaneous Q24H  . feeding supplement  30 mL Oral BID BM  . insulin aspart  0-9 Units Subcutaneous Q4H  . ipratropium  0.5 mg Nebulization  Q4H  . levalbuterol  0.63 mg Nebulization Q4H  . losartan  50 mg Oral Daily  . methylPREDNISolone (SOLU-MEDROL) injection  60 mg Intravenous Q12H  . pantoprazole (PROTONIX) IV  40 mg Intravenous Daily  . sodium chloride  10-40 mL Intracatheter Q12H   Continuous:   . dextrose 5 % and 0.45% NaCl    . DISCONTD: dextrose 5 % and 0.45 % NaCl with KCl 20 mEq/L 20 mL/hr at 10/21/11 1000  . DISCONTD: dextrose 5 % and 0.9% NaCl 70 mL/hr at 10/22/11 0452   ZOX:[id number], acetaminophen, alum & mag hydroxide-simeth, hydrALAZINE, levalbuterol, LORazepam, ondansetron (ZOFRAN) IV, ondansetron, RESOURCE THICKENUP CLEAR, sodium chloride  Assesment: He was admitted with acute on chronic respiratory failure. He required mechanical ventilation but has improved. He is high  risk of aspiration. He is very weak. He seems to be slowly improving Principal Problem:  *Acute respiratory failure with hypercapnia Active Problems:  COPD with acute exacerbation  Tachycardia  Right Axis Deviation  Anemia  Chronic respiratory failure with hypoxia  Chronic back pain  PNA (pneumonia)  Hypertension  Rhabdomyolysis  Hyperglycemia, drug-induced  Encephalopathy acute  Acute renal failure  Dysphagia  Lethargy  Sinusitis  Hypernatremia    Plan: Continue current treatments I have taken the liberty of asking for a physical therapy consultation    LOS: 10 days   Dayane Hillenburg L 10/22/2011, 8:04 AM

## 2011-10-23 ENCOUNTER — Inpatient Hospital Stay (HOSPITAL_COMMUNITY): Payer: Medicare PPO

## 2011-10-23 DIAGNOSIS — E870 Hyperosmolality and hypernatremia: Secondary | ICD-10-CM

## 2011-10-23 LAB — BASIC METABOLIC PANEL
CO2: 40 mEq/L (ref 19–32)
Chloride: 104 mEq/L (ref 96–112)
Creatinine, Ser: 1.17 mg/dL (ref 0.50–1.35)
Potassium: 4.2 mEq/L (ref 3.5–5.1)
Sodium: 145 mEq/L (ref 135–145)

## 2011-10-23 LAB — CBC
MCV: 94.7 fL (ref 78.0–100.0)
Platelets: 165 10*3/uL (ref 150–400)
RBC: 3.6 MIL/uL — ABNORMAL LOW (ref 4.22–5.81)
WBC: 10.5 10*3/uL (ref 4.0–10.5)

## 2011-10-23 LAB — GLUCOSE, CAPILLARY
Glucose-Capillary: 111 mg/dL — ABNORMAL HIGH (ref 70–99)
Glucose-Capillary: 109 mg/dL — ABNORMAL HIGH (ref 70–99)

## 2011-10-23 LAB — BLOOD GAS, ARTERIAL
Bicarbonate: 37.2 mEq/L — ABNORMAL HIGH (ref 20.0–24.0)
TCO2: 33.9 mmol/L (ref 0–100)
pCO2 arterial: 65.8 mmHg (ref 35.0–45.0)
pH, Arterial: 7.371 (ref 7.350–7.450)
pO2, Arterial: 98.6 mmHg (ref 80.0–100.0)

## 2011-10-23 LAB — PRO B NATRIURETIC PEPTIDE: Pro B Natriuretic peptide (BNP): 354.7 pg/mL (ref 0–450)

## 2011-10-23 MED ORDER — HYDROCHLOROTHIAZIDE 25 MG PO TABS
25.0000 mg | ORAL_TABLET | Freq: Every day | ORAL | Status: DC
  Administered 2011-10-23 – 2011-10-24 (×2): 25 mg via ORAL
  Filled 2011-10-23 (×2): qty 1

## 2011-10-23 MED ORDER — LEVALBUTEROL HCL 0.63 MG/3ML IN NEBU
INHALATION_SOLUTION | RESPIRATORY_TRACT | Status: AC
  Filled 2011-10-23: qty 3

## 2011-10-23 MED ORDER — LOSARTAN POTASSIUM 50 MG PO TABS
100.0000 mg | ORAL_TABLET | Freq: Every day | ORAL | Status: DC
  Administered 2011-10-23 – 2011-10-24 (×2): 100 mg via ORAL
  Filled 2011-10-23 (×2): qty 2

## 2011-10-23 MED ORDER — SODIUM CHLORIDE 0.9 % IJ SOLN
INTRAMUSCULAR | Status: AC
  Filled 2011-10-23: qty 3

## 2011-10-23 MED ORDER — INSULIN ASPART 100 UNIT/ML ~~LOC~~ SOLN
0.0000 [IU] | Freq: Three times a day (TID) | SUBCUTANEOUS | Status: DC
  Administered 2011-10-23: 1 [IU] via SUBCUTANEOUS

## 2011-10-23 NOTE — Progress Notes (Signed)
Physical Therapy Treatment Patient Details Name: Harold Johnson MRN: [id number] DOB: 1934-11-22 Today's Date: 10/23/2011 Time: 8295-6213 PT Time Calculation (min): 28 min  PT Assessment / Plan / Recommendation Comments on Treatment Session  Pt is transferred out of ICU.  Had been up in a chair all morning.  Mobility is significantly improved today, now needing only min assist to transfer chair to bed.  He is very fatigued from sitting all morning,  and began falling asleep during ther ex, so we stopped.  Should be able to begin gait tranining in AM with a walker,    Follow Up Recommendations       Barriers to Discharge        Equipment Recommendations       Recommendations for Other Services    Frequency     Plan Discharge plan remains appropriate;Frequency remains appropriate    Precautions / Restrictions     Pertinent Vitals/Pain     Mobility  Bed Mobility Bed Mobility: Sit to Supine Sit to Supine: 4: Min guard Transfers Transfers: Sit to Stand;Stand to Sit;Stand Pivot Transfers Sit to Stand: 4: Min assist Stand to Sit: 4: Min assist Stand Pivot Transfers: 4: Min assist Details for Transfer Assistance: pt has been up in a chair all morning and is fatigued...he transfers very well and should be able to begin gait in AM when less tired Ambulation/Gait Ambulation/Gait Assistance: Not tested (comment)    Exercises General Exercises - Lower Extremity Ankle Circles/Pumps: Strengthening;Both;10 reps;Supine Quad Sets: AROM;Both;10 reps;Supine Gluteal Sets: AROM;Both;10 reps;Supine Heel Slides: AROM;Strengthening;Both;5 reps;Supine   PT Diagnosis:    PT Problem List:   PT Treatment Interventions:     PT Goals Acute Rehab PT Goals Pt will go Sit to Supine/Side: with modified independence PT Goal: Sit to Supine/Side - Progress: Updated due to goal met Pt will go Sit to Stand: with supervision PT Goal: Sit to Stand - Progress: Updated due to goal met Pt will go Stand to Sit:  with supervision PT Goal: Stand to Sit - Progress: Goal set today Pt will Transfer Bed to Chair/Chair to Bed: with supervision PT Transfer Goal: Bed to Chair/Chair to Bed - Progress: Goal set today PT Goal: Stand - Progress: Discontinued (comment) Pt will Ambulate: 16 - 50 feet;with min assist;with least restrictive assistive device PT Goal: Ambulate - Progress: Goal set today  Visit Information  Last PT Received On: 10/23/11    Subjective Data  Subjective: feels much better today   Cognition       Balance  Balance Balance Assessed: No  End of Session PT - End of Session Equipment Utilized During Treatment: Gait belt Activity Tolerance: Patient tolerated treatment well;Patient limited by fatigue Patient left: in bed;with call bell/phone within reach;with bed alarm set   GP     Myrlene Broker L 10/23/2011, 1:51 PM

## 2011-10-23 NOTE — Progress Notes (Signed)
Report given to Abby, RN on Dept 300.  Pt is A&O, VSS.  Pt's wife notified of transfer to room 337.  Pt taken to room 337 via wheelchair along with belongings.  Pt will remain on telemetry.

## 2011-10-23 NOTE — Progress Notes (Signed)
UR Chart Review Completed  

## 2011-10-23 NOTE — Clinical Social Work Psychosocial (Signed)
    Clinical Social Work Department BRIEF PSYCHOSOCIAL ASSESSMENT 10/23/2011  Patient:  Harold Johnson, Harold Johnson     Account Number:  [id number]     Admit date:  10/12/2011  Clinical Social Worker:  Santa Genera, CLINICAL SOCIAL WORKER  Date/Time:  10/23/2011 10:30 AM  Referred by:  Physician  Date Referred:  10/23/2011  Other Referral:   Interview type:  Patient Other interview type:   Also spoke w wife by phone.    PSYCHOSOCIAL DATA Living Status:  WIFE Admitted from facility:   Level of care:   Primary support name:  Harold Johnson Primary support relationship to patient:  SPOUSE Degree of support available:   Significant    CURRENT CONCERNS Current Concerns  Post-Acute Placement   Other Concerns:    SOCIAL WORK ASSESSMENT / PLAN CSW interviewed patient at bedside.  Patient lives at home w wife, is retired from [employer] where he worked for 30 years.  Has a daughter who lives out of town.  Patient understands that he needs SNF placement to continue to regain strength, states he "thinks he can do it" but expresses some concern about his prospects for returning home.  Realizes he is weak and deconditioned.  Wife agreeable to placement for rehab, and requested placement in [facility].  This is first placement for patient, gave family information on placement process. Wife does not drive, depends on family members for transportation.   Assessment/plan status:  Psychosocial Support/Ongoing Assessment of Needs Other assessment/ plan:   Will fax patient out via [facility] and inform of bed offers.   Information/referral to community resources:   SNF list  Placement Handbook    PATIENT'S/FAMILY'S RESPONSE TO PLAN OF CARE: Patient and wife appreciative.   Clovis Cao Clinical Social Worker [phone])

## 2011-10-23 NOTE — Progress Notes (Signed)
Occupational Therapy Treatment Patient Details Name: Harold Johnson MRN: [id number] DOB: June 27, 1934 Today's Date: 10/23/2011 Time: 4098-1191 OT Time Calculation (min): 27 min  OT Assessment / Plan / Recommendation Comments on Treatment Session Wife and friend present in room during tx session. Patient in bed and agreeable to participate in tx session. Patient agreed to transfer into chair and perform Bil UE exercises. Patient was unable to perform for long before becoming fatigued and was unable to continue. Patient was in high spirits and alert and oriented. Patient is progressing well.    Follow Up Recommendations  Skilled nursing facility       Equipment Recommendations  Defer to next venue       Frequency Min 2X/week   Plan Discharge plan remains appropriate    Precautions / Restrictions Precautions Precautions: Fall   Pertinent Vitals/Pain No reports of pain.      OT Goals ADL Goals ADL Goal: Upper Body Dressing - Progress: Progressing toward goals ADL Goal: Toilet Transfer - Progress: Met Arm Goals Arm Goal: Additional Goal #1 - Progress: Progressing toward goals  Visit Information  Last OT Received On: 10/23/11 Assistance Needed: +1    Subjective Data  Subjective: "I can get up in the chair." Patient Stated Goal: To go home.         Mobility  Transfers Transfers: Sit to Stand;Stand to Sit Sit to Stand: 4: Min assist;From bed;With upper extremity assist Stand to Sit: To chair/3-in-1;4: Min assist;With upper extremity assist   Exercises General Exercises - Upper Extremity Shoulder Flexion: AROM;Both;Seated (2 reps) Shoulder Extension: AROM;Both;Seated (2 reps) Shoulder Horizontal ABduction: AROM;Both;Seated (2 reps) Shoulder Horizontal ADduction: AROM;Both;Seated (both) Elbow Flexion: AROM;Both;Seated (2 reps) Elbow Extension: AROM;Seated;Both (2 reps)   Balance Balance Balance Assessed: No  End of Session OT - End of Session Equipment Utilized During  Treatment: Gait belt Activity Tolerance: Patient limited by fatigue Patient left: in chair;with call bell/phone within reach;with family/visitor present    Limmie Patricia, OTR/L  10/23/2011, 3:42 PM

## 2011-10-23 NOTE — Clinical Social Work Placement (Signed)
    Clinical Social Work Department CLINICAL SOCIAL WORK PLACEMENT NOTE 10/24/2011  Patient:  IBN, STIEF  Account Number:  [id number] Admit date:  10/12/2011  Clinical Social Worker:  Santa Genera, CLINICAL SOCIAL WORKER  Date/time:  10/23/2011 11:00 AM  Clinical Social Work is seeking post-discharge placement for this patient at the following level of care:   SKILLED NURSING   (*CSW will update this form in Epic as items are completed)   10/23/2011  Patient/family provided with [facility] Department of Clinical Social Work's list of facilities offering this level of care within the geographic area requested by the patient (or if unable, by the patient's family).  10/23/2011  Patient/family informed of their freedom to choose among providers that offer the needed level of care, that participate in Medicare, Medicaid or managed care program needed by the patient, have an available bed and are willing to accept the patient.  10/23/2011  Patient/family informed of [facility]' ownership interest in [facility], as well as of the fact that they are under no obligation to receive care at this facility.  PASARR submitted to EDS on 10/22/2011 PASARR number received from EDS on 10/22/2011  FL2 transmitted to all facilities in geographic area requested by pt/family on  10/23/2011 FL2 transmitted to all facilities within larger geographic area on   Patient informed that his/her managed care company has contracts with or will negotiate with  certain facilities, including the following:   [facility]     Patient/family informed of bed offers received:  10/23/2011 Patient chooses bed at [facility] Physician recommends and patient chooses bed at    Patient to be transferred to [facility] on  10/24/2011 Patient to be transferred to facility by [facility] or family  The following physician request were entered in Epic:   Additional Comments:  Clovis Cao Clinical Social Worker [phone])

## 2011-10-23 NOTE — Clinical Social Work Note (Signed)
Phone call to wife, Giorgi Debruin.  Family aware that SNF has been recommended and agreeable to placement.  Will give patient and family SNF list and Placement Handbook.  Wife requests placement in [facility] near their home if possible.  Patient will be faxed out today via [facility].  Clovis Cao Clinical Social Worker [phone])

## 2011-10-23 NOTE — Progress Notes (Signed)
Speech Language Pathology Dysphagia Treatment Patient Details Name: Harold Johnson MRN: [id number] DOB: April 16, 1934 Today's Date: 10/23/2011 Time: 2841-3244 SLP Time Calculation (min): 22 min  Assessment / Plan / Recommendation Clinical Impression  Mr. Tiegs reportedly consumed ~75% of am meal, but declined lunch meal. He repeatedly stated that he really didn't want anything and had to be encouraged to take a small bite of lunch meal. He was agreeable to trials of thin liquids, but demonstrated a delayed cough. Although he appears much more alert, recommend continuing diet as ordered.    Diet Recommendation  Continue with Current Diet: Dysphagia 3 (mechanical soft);Nectar-thick liquid (ok for a couple ice chips when given by trained caregiver/RN)    SLP Plan Continue with current plan of care (F/U dysphagia intervention at SNF for diet upgrades/toleranc)   Pertinent Vitals/Pain       General Temperature Spikes Noted: No Respiratory Status: Supplemental O2 delivered via (comment) Behavior/Cognition: Alert;Cooperative;Other (comment) (flat affect) Oral Cavity - Dentition: Missing dentition (missing some) Patient Positioning: Upright in chair  Oral Cavity - Oral Hygiene Does patient have any of the following "at risk" factors?: Oxygen therapy - cannula, mask, simple oxygen devices;Diet - patient on thickened liquids Brush patient's teeth BID with toothbrush (using toothpaste with fluoride): Yes Patient is AT RISK - Oral Care Protocol followed (see row info): Yes Patient is mechanically ventilated, follow VAP prevention protocol for oral care: Oral care provided every 4 hours   Dysphagia Treatment Treatment focused on: Skilled observation of diet tolerance;Upgraded PO texture trials Treatment Methods/Modalities: Skilled observation Patient observed directly with PO's: Yes Type of PO's observed: Dysphagia 3 (soft);Thin liquids Feeding: Needs assist Liquids provided via: Straw Pharyngeal  Phase Signs & Symptoms: Delayed cough Type of cueing: Verbal Amount of cueing: Minimal     Thank you,  Havery Moros, CCC-SLP [phone]  PORTER,DABNEY 10/23/2011, 3:48 PM

## 2011-10-23 NOTE — Progress Notes (Signed)
Subjective: The patient is sitting up in bed. He looks much much better. He has no complaints other than generalized weakness.  Objective: Vital signs in last 24 hours: Filed Vitals:   10/23/11 0055 10/23/11 0400 10/23/11 0500 10/23/11 0744  BP: 155/98 173/108    Pulse: 81 81    Temp:  97.6 F (36.4 C)    TempSrc:  Oral    Resp:  22    Height:      Weight:   98.8 kg (217 lb 13 oz)   SpO2:  100%  98%    Intake/Output Summary (Last 24 hours) at 10/23/11 0855 Last data filed at 10/23/11 0600  Gross per 24 hour  Intake   1900 ml  Output   1500 ml  Net    400 ml    Weight change: 0 kg (0 lb)  Physical exam: General: 77 year old African-American man sitting up in bed, in no acute distress. Lungs: Few fine wheezes. Breathing nonlabored. Heart: S1, S2, with no murmurs rubs or gallops. Abdomen: Hypoactive bowel sounds, soft, nontender, nondistended. Extremities: No pedal edema. Neurologic: He is more alert, but appears debilitated. He is oriented to himself and hospital. He follows small commands. His speech is much clearer. Cranial nerves II through XII are grossly intact.   Lab Results: Basic Metabolic Panel:  Basename 10/23/11 0423 10/22/11 0428  NA 145 147*  K 4.2 4.8  CL 104 104  CO2 40* 36*  GLUCOSE 110* 130*  BUN 35* 41*  CREATININE 1.17 1.14  CALCIUM 9.8 10.2  MG -- --  PHOS -- --   Liver Function Tests: No results found for this basename: AST:2,ALT:2,ALKPHOS:2,BILITOT:2,PROT:2,ALBUMIN:2 in the last 72 hours No results found for this basename: LIPASE:2,AMYLASE:2 in the last 72 hours  Basename 10/21/11 1012  AMMONIA 17   CBC:  Basename 10/23/11 0423  WBC 10.5  NEUTROABS --  HGB 10.6*  HCT 34.1*  MCV 94.7  PLT 165   Cardiac Enzymes: No results found for this basename: CKTOTAL:3,CKMB:3,CKMBINDEX:3,TROPONINI:3 in the last 72 hours BNP: No results found for this basename: PROBNP:3 in the last 72 hours D-Dimer: No results found for this basename:  DDIMER:2 in the last 72 hours CBG:  Basename 10/23/11 0437 10/23/11 0012 10/22/11 2100 10/22/11 1644 10/22/11 1144 10/22/11 0733  GLUCAP 101* 108* 130* 154* 132* 131*   Hemoglobin A1C: No results found for this basename: HGBA1C in the last 72 hours Fasting Lipid Panel: No results found for this basename: CHOL,HDL,LDLCALC,TRIG,CHOLHDL,LDLDIRECT in the last 72 hours Thyroid Function Tests: No results found for this basename: TSH,T4TOTAL,FREET4,T3FREE,THYROIDAB in the last 72 hours Anemia Panel: No results found for this basename: VITAMINB12,FOLATE,FERRITIN,TIBC,IRON,RETICCTPCT in the last 72 hours Coagulation: No results found for this basename: LABPROT:2,INR:2 in the last 72 hours Urine Drug Screen: Drugs of Abuse  No results found for this basename: labopia,  cocainscrnur,  labbenz,  amphetmu,  thcu,  labbarb    Alcohol Level: No results found for this basename: ETH:2 in the last 72 hours Urinalysis: No results found for this basename: COLORURINE:2,APPERANCEUR:2,LABSPEC:2,PHURINE:2,GLUCOSEU:2,HGBUR:2,BILIRUBINUR:2,KETONESUR:2,PROTEINUR:2,UROBILINOGEN:2,NITRITE:2,LEUKOCYTESUR:2 in the last 72 hours Misc. Labs: ABG: PH 7.3, PCO2 65.8, PO2 98.6.  Micro: No results found for this or any previous visit (from the past 240 hour(s)).  Studies/Results: Ct Head Wo Contrast  10/21/2011  *RADIOLOGY REPORT*  Clinical Data: Lethargy.  COPD  CT HEAD WITHOUT CONTRAST  Technique:  Contiguous axial images were obtained from the base of the skull through the vertex without contrast.  Comparison: None.  Findings: Multiple images  were repeated due to motion.  Suboptimal image quality.  Ventricle size is normal.  Negative for acute infarct.  Probable mild chronic microvascular ischemia in the frontal white matter. Negative for hemorrhage or mass.  Sinusitis with air-fluid level in the right maxillary sinus. Mucosal edema in the ethmoid and sphenoid sinuses.  IMPRESSION: No acute intracranial abnormality.   Sinusitis with air-fluid level on the right.  Original Report Authenticated By: Camelia Phenes, M.D.   Dg Chest Port 1 View  10/23/2011  *RADIOLOGY REPORT*  Clinical Data: Pneumonia and COPD.  PORTABLE CHEST - 1 VIEW  Comparison: Chest x-ray 10/17/2011.  Findings: Previously noted endotracheal tube has been removed. There is a right upper extremity PICC with tip terminating in the right atrium. Previously noted nasogastric tube has been removed. No pneumothorax.  Lung volumes are within normal limits.  Linear opacity in the left lower lobe is favored to represent subsegmental atelectasis or scarring. No definite consolidative airspace disease.  No pleural effusions.  Heart size is normal.  Prominence of the central pulmonary arteries is unchanged.  IMPRESSION: 1.  Support apparatus, as above. 2.  Persistent left lower lobe subsegmental atelectasis. 3.  Prominence of the central pulmonary arteries again noted, which could suggest underlying pulmonary arterial hypertension.  Original Report Authenticated By: Florencia Reasons, M.D.   Dg Swallowing Func-no Report  10/21/2011  CLINICAL DATA: dysphagia   FLUOROSCOPY FOR SWALLOWING FUNCTION STUDY:  Fluoroscopy was provided for swallowing function study, which was  administered by a speech pathologist.  Final results and recommendations  from this study are contained within the speech pathology report.      Medications:  Scheduled:    . amLODipine  5 mg Oral Daily  . antiseptic oral rinse  15 mL Mouth Rinse QID  . cefTRIAXone (ROCEPHIN)  IV  1 g Intravenous Q24H  . chlorhexidine  15 mL Mouth Rinse BID  . cloNIDine  0.1 mg Oral BID  . enoxaparin (LOVENOX) injection  40 mg Subcutaneous Q24H  . feeding supplement  30 mL Oral BID BM  . hydrochlorothiazide  25 mg Oral Daily  . insulin aspart  0-9 Units Subcutaneous Q4H  . ipratropium  0.5 mg Nebulization Q4H  . levalbuterol      . levalbuterol  0.63 mg Nebulization Q4H  . losartan  100 mg Oral Daily    . pantoprazole (PROTONIX) IV  40 mg Intravenous Daily  . predniSONE  60 mg Oral Daily  . sodium chloride  10-40 mL Intracatheter Q12H  . sodium chloride      . sodium chloride      . DISCONTD: losartan  50 mg Oral Daily  . DISCONTD: methylPREDNISolone (SOLU-MEDROL) injection  60 mg Intravenous Q12H   Continuous:    . dextrose 5 % and 0.45% NaCl 70 mL/hr at 10/23/11 0600   ZOX:[id number], acetaminophen, alum & mag hydroxide-simeth, hydrALAZINE, levalbuterol, LORazepam, ondansetron (ZOFRAN) IV, ondansetron, RESOURCE THICKENUP CLEAR, sodium chloride  Assessment: Principal Problem:  *Acute respiratory failure with hypercapnia Active Problems:  Chronic respiratory failure with hypoxia  PNA (pneumonia)  COPD with acute exacerbation  Tachycardia  Right Axis Deviation  Anemia  Chronic back pain  Hypertension  Rhabdomyolysis  Hyperglycemia, drug-induced  Encephalopathy acute  Acute renal failure  Dysphagia  Lethargy  Sinusitis  Hypernatremia  Debility  The patient is improving clinically. He is still rather debilitated. His hyponatremia has resolved with gentle hypotonic IV fluids. His blood pressure is increasing. Will, therefore restart all of his antihypertensive medications  at their prehospital doses.   Plan: 1. We'll decrease his IV fluids. 2. We'll increase the dose of his chronic antihypertensive medications. 3. We'll transfer to telemetry. 4. Continue physical therapy in the hospital. It is likely that the patient will require skilled nursing facility placement. He is in agreement. This will be discussed with his wife.       LOS: 11 days   Adolph Clutter 10/23/2011, 8:55 AM

## 2011-10-23 NOTE — Progress Notes (Signed)
CRITICAL VALUE ALERT  Critical value received:  PCO2 65.8  Date of notification:  10/23/2011  Time of notification: 07:55  Critical value read back: yes  Nurse who received alert:  C. Tiburcio Pea, RN  MD notified (1st page): Dr. Sherrie Mustache  Time of first page:  07:58  MD notified (2nd page):  Time of second page:  Responding MD:  Dr. Sherrie Mustache  Time MD responded: 7:58

## 2011-10-23 NOTE — Progress Notes (Signed)
Subjective: He is more awake and alert. He is still very weak. He says he somewhat short of breath.  Objective: Vital signs in last 24 hours: Temp:  [97.5 F (36.4 C)-98.2 F (36.8 C)] 97.6 F (36.4 C) (07/18 0400) Pulse Rate:  [80-102] 81  (07/18 0400) Resp:  [18-25] 22  (07/18 0400) BP: (142-176)/(74-108) 173/108 mmHg (07/18 0400) SpO2:  [95 %-100 %] 98 % (07/18 0744) Weight:  [98.8 kg (217 lb 13 oz)] 98.8 kg (217 lb 13 oz) (07/18 0500) Weight change: 0 kg (0 lb) Last BM Date: 10/22/11  Intake/Output from previous day: 07/17 0701 - 07/18 0700 In: 1960 [P.O.:360; I.V.:1540; IV Piggyback:60] Out: 1500 [Urine:1500]  PHYSICAL EXAM General appearance: alert, cooperative and no distress Resp: diminished breath sounds bilaterally Cardio: regular rate and rhythm, S1, S2 normal, no murmur, click, rub or gallop GI: soft, non-tender; bowel sounds normal; no masses,  no organomegaly Extremities: extremities normal, atraumatic, no cyanosis or edema  Lab Results:    Basic Metabolic Panel:  Basename 10/23/11 0423 10/22/11 0428  NA 145 147*  K 4.2 4.8  CL 104 104  CO2 40* 36*  GLUCOSE 110* 130*  BUN 35* 41*  CREATININE 1.17 1.14  CALCIUM 9.8 10.2  MG -- --  PHOS -- --   Liver Function Tests: No results found for this basename: AST:2,ALT:2,ALKPHOS:2,BILITOT:2,PROT:2,ALBUMIN:2 in the last 72 hours No results found for this basename: LIPASE:2,AMYLASE:2 in the last 72 hours  Basename 10/21/11 1012  AMMONIA 17   CBC:  Basename 10/23/11 0423  WBC 10.5  NEUTROABS --  HGB 10.6*  HCT 34.1*  MCV 94.7  PLT 165   Cardiac Enzymes: No results found for this basename: CKTOTAL:3,CKMB:3,CKMBINDEX:3,TROPONINI:3 in the last 72 hours BNP: No results found for this basename: PROBNP:3 in the last 72 hours D-Dimer: No results found for this basename: DDIMER:2 in the last 72 hours CBG:  Basename 10/23/11 0437 10/23/11 0012 10/22/11 2100 10/22/11 1644 10/22/11 1144 10/22/11 0733    GLUCAP 101* 108* 130* 154* 132* 131*   Hemoglobin A1C: No results found for this basename: HGBA1C in the last 72 hours Fasting Lipid Panel: No results found for this basename: CHOL,HDL,LDLCALC,TRIG,CHOLHDL,LDLDIRECT in the last 72 hours Thyroid Function Tests: No results found for this basename: TSH,T4TOTAL,FREET4,T3FREE,THYROIDAB in the last 72 hours Anemia Panel: No results found for this basename: VITAMINB12,FOLATE,FERRITIN,TIBC,IRON,RETICCTPCT in the last 72 hours Coagulation: No results found for this basename: LABPROT:2,INR:2 in the last 72 hours Urine Drug Screen: Drugs of Abuse  No results found for this basename: labopia, cocainscrnur, labbenz, amphetmu, thcu, labbarb    Alcohol Level: No results found for this basename: ETH:2 in the last 72 hours Urinalysis: No results found for this basename: COLORURINE:2,APPERANCEUR:2,LABSPEC:2,PHURINE:2,GLUCOSEU:2,HGBUR:2,BILIRUBINUR:2,KETONESUR:2,PROTEINUR:2,UROBILINOGEN:2,NITRITE:2,LEUKOCYTESUR:2 in the last 72 hours Misc. Labs:  ABGS  Basename 10/21/11 1004  PHART 7.339*  PO2ART 83.6  TCO2 34.4  HCO3 37.6*   CULTURES No results found for this or any previous visit (from the past 240 hour(s)). Studies/Results: Ct Head Wo Contrast  10/21/2011  *RADIOLOGY REPORT*  Clinical Data: Lethargy.  COPD  CT HEAD WITHOUT CONTRAST  Technique:  Contiguous axial images were obtained from the base of the skull through the vertex without contrast.  Comparison: None.  Findings: Multiple images were repeated due to motion.  Suboptimal image quality.  Ventricle size is normal.  Negative for acute infarct.  Probable mild chronic microvascular ischemia in the frontal white matter. Negative for hemorrhage or mass.  Sinusitis with air-fluid level in the right maxillary sinus. Mucosal  edema in the ethmoid and sphenoid sinuses.  IMPRESSION: No acute intracranial abnormality.  Sinusitis with air-fluid level on the right.  Original Report Authenticated By:  Camelia Phenes, M.D.   Dg Swallowing Func-no Report  10/21/2011  CLINICAL DATA: dysphagia   FLUOROSCOPY FOR SWALLOWING FUNCTION STUDY:  Fluoroscopy was provided for swallowing function study, which was  administered by a speech pathologist.  Final results and recommendations  from this study are contained within the speech pathology report.      Medications:  Scheduled:   . amLODipine  5 mg Oral Daily  . antiseptic oral rinse  15 mL Mouth Rinse QID  . cefTRIAXone (ROCEPHIN)  IV  1 g Intravenous Q24H  . chlorhexidine  15 mL Mouth Rinse BID  . cloNIDine  0.1 mg Oral BID  . enoxaparin (LOVENOX) injection  40 mg Subcutaneous Q24H  . feeding supplement  30 mL Oral BID BM  . hydrochlorothiazide  25 mg Oral Daily  . insulin aspart  0-9 Units Subcutaneous Q4H  . ipratropium  0.5 mg Nebulization Q4H  . levalbuterol      . levalbuterol  0.63 mg Nebulization Q4H  . losartan  100 mg Oral Daily  . pantoprazole (PROTONIX) IV  40 mg Intravenous Daily  . predniSONE  60 mg Oral Daily  . sodium chloride  10-40 mL Intracatheter Q12H  . sodium chloride      . sodium chloride      . DISCONTD: losartan  50 mg Oral Daily  . DISCONTD: methylPREDNISolone (SOLU-MEDROL) injection  60 mg Intravenous Q12H   Continuous:   . dextrose 5 % and 0.45% NaCl 70 mL/hr at 10/23/11 0600   ZOX:[id number], acetaminophen, alum & mag hydroxide-simeth, hydrALAZINE, levalbuterol, LORazepam, ondansetron (ZOFRAN) IV, ondansetron, RESOURCE THICKENUP CLEAR, sodium chloride  Assesment: He had an episode of acute respiratory failure. He has chronic respiratory failure as well. He was able to be successfully extubated. Plans are being made for him to go to skilled care facility I believe. Principal Problem:  *Acute respiratory failure with hypercapnia Active Problems:  COPD with acute exacerbation  Tachycardia  Right Axis Deviation  Anemia  Chronic respiratory failure with hypoxia  Chronic back pain  PNA (pneumonia)   Hypertension  Rhabdomyolysis  Hyperglycemia, drug-induced  Encephalopathy acute  Acute renal failure  Dysphagia  Lethargy  Sinusitis  Hypernatremia  Debility    Plan: Continue with current treatments I will plan to follow somewhat more peripherally since he is approaching discharge    LOS: 11 days   Gaylin Osoria L 10/23/2011, 7:55 AM

## 2011-10-24 ENCOUNTER — Encounter (HOSPITAL_COMMUNITY): Payer: Self-pay | Admitting: Internal Medicine

## 2011-10-24 LAB — BASIC METABOLIC PANEL
BUN: 27 mg/dL — ABNORMAL HIGH (ref 6–23)
Chloride: 101 mEq/L (ref 96–112)
Creatinine, Ser: 1.07 mg/dL (ref 0.50–1.35)
GFR calc Af Amer: 76 mL/min — ABNORMAL LOW (ref >90)
GFR calc non Af Amer: 66 mL/min — ABNORMAL LOW (ref >90)
Glucose, Bld: 395 mg/dL — ABNORMAL HIGH (ref 70–99)

## 2011-10-24 MED ORDER — NABUMETONE 500 MG PO TABS: 500.0000 mg | ORAL_TABLET | Freq: Two times a day (BID) | ORAL | Status: DC | PRN

## 2011-10-24 MED ORDER — TIOTROPIUM BROMIDE MONOHYDRATE 18 MCG IN CAPS: 18.0000 ug | ORAL_CAPSULE | Freq: Every day | RESPIRATORY_TRACT | Status: DC

## 2011-10-24 MED ORDER — OMEPRAZOLE 20 MG PO CPDR: 20.0000 mg | DELAYED_RELEASE_CAPSULE | Freq: Every day | ORAL | Status: DC

## 2011-10-24 MED ORDER — PREDNISONE 10 MG PO TABS: ORAL_TABLET | ORAL | Status: DC

## 2011-10-24 MED ORDER — PANTOPRAZOLE SODIUM 40 MG PO TBEC
40.0000 mg | DELAYED_RELEASE_TABLET | Freq: Every day | ORAL | Status: DC
  Administered 2011-10-24: 40 mg via ORAL
  Filled 2011-10-24: qty 1

## 2011-10-24 MED ORDER — PREDNISONE 20 MG PO TABS: 50.0000 mg | ORAL_TABLET | Freq: Every day | ORAL | Status: DC

## 2011-10-24 MED ORDER — TIOTROPIUM BROMIDE MONOHYDRATE 18 MCG IN CAPS
18.0000 ug | ORAL_CAPSULE | Freq: Every day | RESPIRATORY_TRACT | Status: DC
  Filled 2011-10-24: qty 5

## 2011-10-24 MED ORDER — LEVALBUTEROL HCL 0.63 MG/3ML IN NEBU
0.6300 mg | INHALATION_SOLUTION | Freq: Four times a day (QID) | RESPIRATORY_TRACT | Status: DC
  Administered 2011-10-24: 0.63 mg via RESPIRATORY_TRACT

## 2011-10-24 MED ORDER — GABAPENTIN 300 MG PO CAPS: 300.0000 mg | ORAL_CAPSULE | Freq: Every day | ORAL | Status: DC

## 2011-10-24 MED ORDER — RESOURCE THICKENUP CLEAR PO POWD: 1.0000 g | ORAL | Status: DC | PRN

## 2011-10-24 MED ORDER — LEVALBUTEROL HCL 0.63 MG/3ML IN NEBU: 0.6300 mg | INHALATION_SOLUTION | Freq: Three times a day (TID) | RESPIRATORY_TRACT | Status: DC

## 2011-10-24 NOTE — Progress Notes (Signed)
He says he wants to go home. He has no new complaints. He is awake and alert  His temperature is 97.4, pulse 74, respirations are 20 and blood pressure 143/81 his chest shows decreased breath sounds but clear. He is able to ambulate short distances. His heart is regular. His renal function seems to be improving.  He is doing better from his episode of acute on chronic respiratory failure. He has severe  COPD. He continues to be located.  Plan is for rehabilitation which I think is appropriate if he will agree

## 2011-10-24 NOTE — Progress Notes (Signed)
Subjective: The patient is sitting up in bed. He looks much much better. He has no complaints other than generalized weakness.  Objective: Vital signs in last 24 hours: Filed Vitals:   10/24/11 0342 10/24/11 0558 10/24/11 0735 10/24/11 1133  BP:  143/81    Pulse:  74    Temp:  97.4 F (36.3 C)    TempSrc:  Oral    Resp:  20    Height:      Weight:      SpO2: 93% 99% 99% 99%    Intake/Output Summary (Last 24 hours) at 10/24/11 1255 Last data filed at 10/24/11 0350  Gross per 24 hour  Intake     50 ml  Output    750 ml  Net   -700 ml    Weight change:   Physical exam: General: 77 year old African-American man sitting up in bed, in no acute distress. Lungs: Few fine wheezes. Breathing nonlabored. Heart: S1, S2, with no murmurs rubs or gallops. Abdomen: Hypoactive bowel sounds, soft, nontender, nondistended. Extremities: No pedal edema. Neurologic: He is more alert, but appears debilitated. He is oriented to himself and hospital. He follows small commands. His speech is much clearer. Cranial nerves II through XII are grossly intact.   Lab Results: Basic Metabolic Panel:  Basename 10/24/11 0503 10/23/11 0423  NA 139 145  K 3.7 4.2  CL 101 104  CO2 35* 40*  GLUCOSE 395* 110*  BUN 27* 35*  CREATININE 1.07 1.17  CALCIUM 8.7 9.8  MG -- --  PHOS -- --   Liver Function Tests: No results found for this basename: AST:2,ALT:2,ALKPHOS:2,BILITOT:2,PROT:2,ALBUMIN:2 in the last 72 hours No results found for this basename: LIPASE:2,AMYLASE:2 in the last 72 hours No results found for this basename: AMMONIA:2 in the last 72 hours CBC:  Basename 10/23/11 0423  WBC 10.5  NEUTROABS --  HGB 10.6*  HCT 34.1*  MCV 94.7  PLT 165   Cardiac Enzymes: No results found for this basename: CKTOTAL:3,CKMB:3,CKMBINDEX:3,TROPONINI:3 in the last 72 hours BNP:  Basename 10/23/11 0437  PROBNP 354.7   D-Dimer: No results found for this basename: DDIMER:2 in the last 72  hours CBG:  Basename 10/24/11 1206 10/24/11 0727 10/23/11 2227 10/23/11 1737 10/23/11 1152 10/23/11 0735  GLUCAP 116* 103* 109* 142* 111* 96   Hemoglobin A1C: No results found for this basename: HGBA1C in the last 72 hours Fasting Lipid Panel: No results found for this basename: CHOL,HDL,LDLCALC,TRIG,CHOLHDL,LDLDIRECT in the last 72 hours Thyroid Function Tests: No results found for this basename: TSH,T4TOTAL,FREET4,T3FREE,THYROIDAB in the last 72 hours Anemia Panel: No results found for this basename: VITAMINB12,FOLATE,FERRITIN,TIBC,IRON,RETICCTPCT in the last 72 hours Coagulation: No results found for this basename: LABPROT:2,INR:2 in the last 72 hours Urine Drug Screen: Drugs of Abuse  No results found for this basename: labopia,  cocainscrnur,  labbenz,  amphetmu,  thcu,  labbarb    Alcohol Level: No results found for this basename: ETH:2 in the last 72 hours Urinalysis: No results found for this basename: COLORURINE:2,APPERANCEUR:2,LABSPEC:2,PHURINE:2,GLUCOSEU:2,HGBUR:2,BILIRUBINUR:2,KETONESUR:2,PROTEINUR:2,UROBILINOGEN:2,NITRITE:2,LEUKOCYTESUR:2 in the last 72 hours Misc. Labs: ABG: PH 7.3, PCO2 65.8, PO2 98.6.  Micro: No results found for this or any previous visit (from the past 240 hour(s)).  Studies/Results: Dg Chest Port 1 View  10/23/2011  *RADIOLOGY REPORT*  Clinical Data: Pneumonia and COPD.  PORTABLE CHEST - 1 VIEW  Comparison: Chest x-ray 10/17/2011.  Findings: Previously noted endotracheal tube has been removed. There is a right upper extremity PICC with tip terminating in the right atrium. Previously noted  nasogastric tube has been removed. No pneumothorax.  Lung volumes are within normal limits.  Linear opacity in the left lower lobe is favored to represent subsegmental atelectasis or scarring. No definite consolidative airspace disease.  No pleural effusions.  Heart size is normal.  Prominence of the central pulmonary arteries is unchanged.  IMPRESSION: 1.  Support  apparatus, as above. 2.  Persistent left lower lobe subsegmental atelectasis. 3.  Prominence of the central pulmonary arteries again noted, which could suggest underlying pulmonary arterial hypertension.  Original Report Authenticated By: Florencia Reasons, M.D.    Medications:  Scheduled:    . amLODipine  5 mg Oral Daily  . antiseptic oral rinse  15 mL Mouth Rinse QID  . cefTRIAXone (ROCEPHIN)  IV  1 g Intravenous Q24H  . chlorhexidine  15 mL Mouth Rinse BID  . cloNIDine  0.1 mg Oral BID  . enoxaparin (LOVENOX) injection  40 mg Subcutaneous Q24H  . feeding supplement  30 mL Oral BID BM  . hydrochlorothiazide  25 mg Oral Daily  . insulin aspart  0-9 Units Subcutaneous TID WC  . ipratropium  0.5 mg Nebulization Q4H  . levalbuterol      . levalbuterol  0.63 mg Nebulization Q4H  . losartan  100 mg Oral Daily  . pantoprazole  40 mg Oral Daily  . predniSONE  60 mg Oral Daily  . sodium chloride  10-40 mL Intracatheter Q12H  . sodium chloride      . DISCONTD: pantoprazole (PROTONIX) IV  40 mg Intravenous Daily   Continuous:    . dextrose 5 % and 0.45% NaCl 50 mL/hr at 10/24/11 1213   HYQ:[id number], acetaminophen, alum & mag hydroxide-simeth, hydrALAZINE, levalbuterol, LORazepam, ondansetron (ZOFRAN) IV, ondansetron, RESOURCE THICKENUP CLEAR, sodium chloride  Assessment: Principal Problem:  *Acute respiratory failure with hypercapnia Active Problems:  Chronic respiratory failure with hypoxia  PNA (pneumonia)  COPD with acute exacerbation  Tachycardia  Right Axis Deviation  Anemia  Chronic back pain  Hypertension  Rhabdomyolysis  Hyperglycemia, drug-induced  Encephalopathy acute  Acute renal failure  Dysphagia  Lethargy  Sinusitis  Hypernatremia  Debility  The patient is improving clinically. He is still rather debilitated. His hypernatremia has resolved with gentle hypotonic IV fluids. His blood pressure is better with the start of his chronic antihypertensive  medications. The plan is to discharge him to a skilled nursing facility of choice. He is clinically stable for discharge. Skilled nursing facility bed is pending.   Plan:  1. As above. Discussed with his wife who agrees with the plan. 2. Discontinue Atrovent in favor of a refill. Decreased Xopenex every 6 hours. Decrease prednisone to 50 mg daily and then titrate accordingly.      LOS: 12 days   Harold Johnson 10/24/2011, 12:55 PM

## 2011-10-24 NOTE — Discharge Summary (Signed)
Physician Discharge Summary  Harold Johnson ZOX:[id number] DOB: August 03, 1934 DOA: 10/12/2011  PCP:  Samuel Jester, M.D.  Admit date: 10/12/2011 Discharge date: 10/24/2011  Recommendations for Outpatient Follow-up:  1. Discharged to skilled nursing facility [facility]).  Discharge Diagnoses:  Principal Problem:  *Acute respiratory failure with hypercapnia Active Problems:  Chronic respiratory failure with hypoxia  PNA (pneumonia)  COPD with acute exacerbation  Tachycardia  Right Axis Deviation  Anemia  Chronic back pain  Hypertension  Rhabdomyolysis  Hyperglycemia, drug-induced  Encephalopathy acute  Acute renal failure  Dysphagia  Lethargy  Sinusitis  Hypernatremia  Debility   1. Acute ventilator dependent respiratory failure with hypercapnia. CO2 RETAINER. Successfully extubated. 2. Chronic respiratory failure with hypoxia. 3. Oxygen-dependent COPD with acute exacerbation. The patient should be maintained on nasal cannula oxygen at 2-3 L per minute at all times. 4. Pneumonia. 5. Acute encephalopathy, secondary to hypercarbia (CO2 narcosis). 6. Rhabdomyolysis, etiology not clear, but statin discontinued. 7. Tachycardia secondary to respiratory illness. 8. Steroid-induced hyperglycemia. 9. Mild to moderate dysphagia, following extubation. The patient is being discharged on a dysphagia 3 with nectar thickened liquid diet. Followup evaluation by a speech therapist is recommended in 1 week. 10. Chronic anemia. 11. Hypertension. 12. Acute renal failure secondary to prerenal azotemia. Resolved. 13. Radiographic evidence of sinusitis, but without clinical symptoms. 14. Transient hypernatremia, resolved. 15. Chronic low back pain. 16. Chronic right axis deviation on EKG. 17. Debility.  Discharge Condition: Stable  Diet recommendation: Dysphagia 3 with nectar thickened liquids. Aspiration precautions recommended.  History of present illness: The patient is a 77 year old man  with a history significant for oxygen-dependent COPD, hypertension, and chronic low back pain, who presented to the emergency department on 10/12/2011 with a chief complaint of shortness of breath, wheezing, and cough. In route to the emergency department, apparently the EM she gave him 125 mg of Solu-Medrol and albuterol nebulization. On arrival to the emergency department, his oxygen saturations were in the upper 80s to low 90s on 2 L of oxygen. He was tachycardic with a heart rate of 110-124 beats per minute. His chest x-ray revealed COPD/emphysema, but no acute cardiopulmonary disease. His ABG on 2 L of oxygen revealed a pH of 7.3, PCO2 of 63, and PO2 of 62. His serum sodium was 133, CO2 was 33, glucose was 124, and troponin I was within normal limits. A d-dimer was ordered by the emergency department physician. It was noted to be elevated. Subsequently, a CT angiogram of his chest was ordered. It revealed no evidence of pulmonary embolus but there was a small amount of patchy opacity at the right lung base. He was admitted for further evaluation and management.   Hospital Course:  The patient was admitted to the step down unit for closer monitoring. He was maintained on oxygen therapy at 2-3 L per minute with no appreciable titration given his propensity for CO2 retention. Treatment was started with Rocephin and azithromycin along with steroid therapy with Solu-Medrol. Xopenex and Atrovent nebulizations were ordered every 4 hours and every 2 hours as needed. Tessalon Perles was added as needed for cough. Mucinex was also given. He was given 2 g of magnesium sulfate intravenously to help with decreasing bronchospasms. Gentle hydration was started. Analgesics were ordered as needed for pain and discomfort. He was started prophylactically on H2 blockade with Pepcid. For further evaluation, a number of studies were ordered. His pro BNP was within normal limits at 409. His lactic acid was within normal limits. His  procalcitonin  was within normal limits. His TSH and free T4 were within normal limits at 0.5 and 1.20 respectively. Blood cultures were ordered. They remained negative during hospitalization. His troponin I was within normal limits, however, his total CK was nearly 2000. The followup total CK was 3040, but it reached a high of 5238.. IV fluids were increased for hydration. The etiology of this apparent rectum a lysis was unknown. Nevertheless, statin therapy was discontinued. His anemia panel revealed a mildly low iron of 26, normal ferritin of 140, and normal vitamin B12 level 680.  Approximately 24 hours following hospital admission, the patient became more dyspneic and lethargic/encephalopathic. An ABG was ordered. It revealed a pH of 7.16, PCO2 of 98, and PO2 of 70. His followup chest x-ray revealed no acute changes. He was immediately transferred to the ICU where he was intubated for worsening respiratory failure. The respiratory therapist were advised to not give high doses of oxygen during nebulization treatments as it was suspected that the added oxygen had decreased his respiratory drive resulting in hypercapnic respiratory failure. Pulmonologist, Dr. Juanetta Gosling was consulted. He assisted with ventilator management. An NG tube was inserted. He was started on gentle tube  feedings which were eventually titrated both for hydration and nutrition. The registered dietitian assisted with tube feeding recommendations. while on the ventilator, he began to have copious secretions. IV fluids were titrated down. Sliding scale NovoLog was added for steroid-induced and tube feeding induced hyperglycemia.  The patient was successfully extubated on 10/20/2011 after being on a ventilator for nearly a week. Azithromycin was eventually discontinued after 6 days of treatment. He was maintained on Rocephin until the end of the hospitalization. Followup chest x-rays revealed no acute changes but with persistent left lower lobe  atelectasis and prominence of central pulmonary arteries. His followup ABG revealed pH of 7.3, PCO2 of 66, and PO2 of 99 on 2 L of nasal cannula oxygen. Solu-Medrol was titrated off in favor of prednisone. The prednisone will be tapered over the next 6 days following hospital discharge. Oxygen will be maintained at 2-3 L per minute. Xopenex will be continued at 3 times a day dosing. Atrovent was discontinued in favor of Spiriva once daily.  Following extubation, the speech therapist evaluated the patient and noted that he had dysphagia, mostly related to fatigue. She recommended a dysphagia 3 with nectar thickened liquids. She recommended a followup evaluation of his swallowing at the skilled nursing facility.  The patient became very deconditioned. The physical therapist evaluated him and recommended short-term skilled nursing facility placement for rehabilitation. The medical staff and his wife were in agreement. The patient finally agreed. He is being discharged to the [facility] today in stable condition.   Procedures:  Intubation and extubation.  PICC insertion and removal.  Consultations:  Kari Baars M.D.  Discharge Exam: Filed Vitals:   10/24/11 1408  BP: 122/78  Pulse: 77  Temp: 97.5 F (36.4 C)  Resp: 20   Filed Vitals:   10/24/11 0735 10/24/11 1133 10/24/11 1359 10/24/11 1408  BP:   133/70 122/78  Pulse:   67 77  Temp:   97.9 F (36.6 C) 97.5 F (36.4 C)  TempSrc:   Oral Oral  Resp:   20 20  Height:      Weight:      SpO2: 99% 99% 99% 98%   General: 77 year old African-American man sitting up in the chair, in no acute distress. He appears much more alert and oriented. Cardiovascular: S1, S2, with a  soft systolic murmur. Respiratory: rare wheezes and crackles, mostly cleared with coughing. Breathing is nonlabored.  Discharge Instructions  Discharge Orders    Future Orders Please Complete By Expires   Diet - low sodium heart healthy      Increase activity  slowly      Discharge instructions      Comments:   THE PATIENT IS ON A DYSPHAGIA 3 DIET WITH NECTAR THICKENED LIQUIDS. OCCASIONAL ICE CHIPS ARE PERMITTED WITH TRAINED RN OR SPEECH THERAPIST. THE PATIENT WILL NEED TO HAVE A FOLLOWUP SWALLOW EVALUATION IN ONE WEEK OR LESS AT THE SKILLED NURSING FACILITY.     Medication List  As of 10/24/2011  2:43 PM   STOP taking these medications         simvastatin 20 MG tablet         TAKE these medications         amLODipine 5 MG tablet   Commonly known as: NORVASC   Take 5 mg by mouth daily.      aspirin EC 81 MG tablet   Take 81 mg by mouth daily.      cloNIDine 0.1 MG tablet   Commonly known as: CATAPRES   Take 0.1 mg by mouth at bedtime.      gabapentin 300 MG capsule   Commonly known as: NEURONTIN   Take 1 capsule (300 mg total) by mouth at bedtime.      levalbuterol 0.63 MG/3ML nebulizer solution   Commonly known as: XOPENEX   Take 3 mLs (0.63 mg total) by nebulization 3 (three) times daily.      losartan-hydrochlorothiazide 100-25 MG per tablet   Commonly known as: HYZAAR   Take 1 tablet by mouth daily.      nabumetone 500 MG tablet   Commonly known as: RELAFEN   Take 1 tablet (500 mg total) by mouth 2 (two) times daily as needed for pain.      omeprazole 20 MG capsule   Commonly known as: PRILOSEC   Take 1 capsule (20 mg total) by mouth daily.      predniSONE 10 MG tablet   Commonly known as: DELTASONE   Starting tomorrow take 5 tablets daily for one day; then 4 tablets daily for one day; then 3 tablets the next day; then 2 tablets next day; then 1 tablet the next day; then stop.      RESOURCE THICKENUP CLEAR Powd   Take 1 g by mouth as needed. Appropriate amount of thickened liquids.      tiotropium 18 MCG inhalation capsule   Commonly known as: SPIRIVA   Place 1 capsule (18 mcg total) into inhaler and inhale daily.              The results of significant diagnostics from this hospitalization (including  imaging, microbiology, ancillary and laboratory) are listed below for reference.    Significant Diagnostic Studies: Ct Head Wo Contrast  10/21/2011  *RADIOLOGY REPORT*  Clinical Data: Lethargy.  COPD  CT HEAD WITHOUT CONTRAST  Technique:  Contiguous axial images were obtained from the base of the skull through the vertex without contrast.  Comparison: None.  Findings: Multiple images were repeated due to motion.  Suboptimal image quality.  Ventricle size is normal.  Negative for acute infarct.  Probable mild chronic microvascular ischemia in the frontal white matter. Negative for hemorrhage or mass.  Sinusitis with air-fluid level in the right maxillary sinus. Mucosal edema in the ethmoid and sphenoid sinuses.  IMPRESSION: No  acute intracranial abnormality.  Sinusitis with air-fluid level on the right.  Original Report Authenticated By: Camelia Phenes, M.D.   Ct Angio Chest W/cm &/or Wo Cm  10/12/2011  *RADIOLOGY REPORT*  Clinical Data: Shortness of breath.  Elevated D-dimer.  CT ANGIOGRAPHY CHEST  Technique:  Multidetector CT imaging of the chest using the standard protocol during bolus administration of intravenous contrast. Multiplanar reconstructed images including MIPs were obtained and reviewed to evaluate the vascular anatomy.  Contrast: OMNIPAQUE IOHEXOL 350 MG/ML SOLN  Comparison: Portable chest obtained earlier today.  Findings: Normally opacified pulmonary arteries with no pulmonary arterial filling defects.  Airspace opacity in the inferior, posterior aspect of the right lower lobe.  The remainder of the lungs are clear.  Minimally prominent right inferior hilar lymph nodes.  No lung masses.  Two small areas of low density in the liver, not included in their entirety.  No definite associated enhancement visualized.  IMPRESSION:  1.  No pulmonary emboli. 2.  Small amount of patchy opacity at the right lung base.  This could represent pneumonia or patchy atelectasis. 3.  Minimally prominent  right inferior hilar lymph nodes, most likely reactive. 4.  Two small probable cysts in the liver.  Original Report Authenticated By: Darrol Angel, M.D.   Dg Chest Port 1 View  10/23/2011  *RADIOLOGY REPORT*  Clinical Data: Pneumonia and COPD.  PORTABLE CHEST - 1 VIEW  Comparison: Chest x-ray 10/17/2011.  Findings: Previously noted endotracheal tube has been removed. There is a right upper extremity PICC with tip terminating in the right atrium. Previously noted nasogastric tube has been removed. No pneumothorax.  Lung volumes are within normal limits.  Linear opacity in the left lower lobe is favored to represent subsegmental atelectasis or scarring. No definite consolidative airspace disease.  No pleural effusions.  Heart size is normal.  Prominence of the central pulmonary arteries is unchanged.  IMPRESSION: 1.  Support apparatus, as above. 2.  Persistent left lower lobe subsegmental atelectasis. 3.  Prominence of the central pulmonary arteries again noted, which could suggest underlying pulmonary arterial hypertension.  Original Report Authenticated By: Florencia Reasons, M.D.   Dg Chest Port 1 View  10/17/2011  *RADIOLOGY REPORT*  Clinical Data: Respiratory failure.  PORTABLE CHEST - 1 VIEW  Comparison: 10/16/2011.  Findings: Endotracheal tube and nasogastric tube remain present. The nasogastric tube is doubled upon itself in the gastric fundus. Monitoring leads are projected over the chest.  There is no airspace disease.  No effusion is present.  Cardiopericardial silhouette is within normal limits.  IMPRESSION: 1.  Stable support apparatus. 2.  No change in pulmonary aeration. 3.  Prominence of the pulmonary arteries suggesting pulmonary arterial hypertension. 3.  Prominence of the pulmonary arteries suggesting pulmonary arterial hypertension.  Original Report Authenticated By: Andreas Newport, M.D.   Dg Chest Port 1 View  10/16/2011  *RADIOLOGY REPORT*  Clinical Data: Respiratory failure  PORTABLE  CHEST - 1 VIEW  Comparison: Portable chest x-ray of 10/15/2011  Findings: There is little change in aeration.  The endotracheal tube and right PICC line are unchanged in position and cardiomegaly is stable.  IMPRESSION: No significant change in aeration and placement of PICC line.  Original Report Authenticated By: Juline Patch, M.D.   Portable Chest Xray In Am  10/15/2011  *RADIOLOGY REPORT*  Clinical Data: Respiratory failure  PORTABLE CHEST - 1 VIEW  Comparison: Portable exam 0530 hours compared to 10/14/2011  Findings: Tip of endotracheal tube 3.6 cm  above carina. Nasogastric tube extends into abdomen. Upper normal-sized cardiac silhouette. Mediastinal contours and pulmonary vascularity normal. Improved right upper lobe opacity. Minimal bibasilar atelectasis. No definite infiltrate, pleural effusion or pneumothorax.  IMPRESSION: Minimal bibasilar atelectasis. Improved right upper lobe aeration.  Original Report Authenticated By: Lollie Marrow, M.D.   Dg Chest Port 1 View  10/14/2011  *RADIOLOGY REPORT*  Clinical Data: Intubated.  PORTABLE CHEST - 1 VIEW  Comparison: 2 days ago.  Findings: Endotracheal tube tip 1.5 cm above the carina, directed toward the right mainstem bronchus.  Stable mildly enlarged cardiac silhouette.  Interval minimal patchy opacity at the right lung base.  Diffuse osteopenia.  IMPRESSION:  1.  The endotracheal tube is low in position.  It is recommended that this be retracted 4 cm. 2.  Minimal right basilar pneumonia or patchy atelectasis. 3.  Stable mild cardiomegaly.  Original Report Authenticated By: Darrol Angel, M.D.   Dg Chest Port 1 View  10/12/2011  *RADIOLOGY REPORT*  Clinical Data: 1-week history of shortness of breath, cough, and fever.  History of oxygen dependent COPD.  PORTABLE CHEST - 1 VIEW 10/12/2011 0949 hours:  Comparison: None.  Findings: Emphysematous changes in the upper lobes.  Suboptimal inspiration which accounts for crowded bronchovascular markings in  the lung bases.  Cardiac silhouette mildly enlarged for technique and degree of inspiration.  Lungs clear.  No visible pleural effusions.  IMPRESSION: Suboptimal inspiration.  COPD/emphysema.  No acute cardiopulmonary disease.  Original Report Authenticated By: Arnell Sieving, M.D.   Dg Chest Port 1v Same Day  10/16/2011  *RADIOLOGY REPORT*  Clinical Data: Evaluate endotracheal tube placement.  PORTABLE CHEST - 1 VIEW SAME DAY  Comparison: 10/16/2011  Findings: Endotracheal tube is approximate 4.1 cm above the carina. Patient is rotated towards the right on this examination. Nasogastric tube extends into the stomach region.  The lungs are clear without focal disease or edema.  Heart size is grossly stable. PICC line may have been pulled back and the tip is now in the upper SVC region.  IMPRESSION: Endotracheal tube appears to be appropriately positioned above the carina.  No focal chest disease.  Original Report Authenticated By: Richarda Overlie, M.D.   Dg Chest Port 1v Same Day  10/15/2011  *RADIOLOGY REPORT*  Clinical Data: PICC placement.  PORTABLE CHEST - 1 VIEW SAME DAY  Comparison: Chest 10/15/2011 at 6:35 a.m.  Findings: Right PICC is in place with the tip in the lower superior vena cava.  Support apparatus is otherwise unchanged.  Aeration is unchanged.  Heart size normal.  IMPRESSION: PICC in the lower superior vena cava.  No other change.  Original Report Authenticated By: Bernadene Bell. Maricela Curet, M.D.   Dg Chest Port 1v Same Day  10/14/2011  *RADIOLOGY REPORT*  Clinical Data: Endotracheal tube repositioning.  PORTABLE CHEST - 1 VIEW SAME DAY 10/14/2011 1437 hours:  Comparison: Portable chest x-ray earlier same date 1236 hours.  Findings: Endotracheal tube tip now in satisfactory position approximately 3 cm above the carina.  New mild atelectasis in the right upper lobe.  Minimal linear atelectasis at the right lung base.  Lungs otherwise clear.  Cardiac silhouette mildly enlarged but stable.  Pulmonary  vascularity normal.  Nasogastric tube looped in the stomach with its tip in the proximal body.  IMPRESSION:  1.  Endotracheal tube tip now in satisfactory position approximately 3 cm above the carina. 2.  New mild right upper lobe atelectasis.  Stable minimal linear atelectasis at the right lung  base. 3.  Stable cardiomegaly without pulmonary edema.  Original Report Authenticated By: Arnell Sieving, M.D.   Dg Swallowing Func-no Report  10/21/2011  CLINICAL DATA: dysphagia   FLUOROSCOPY FOR SWALLOWING FUNCTION STUDY:  Fluoroscopy was provided for swallowing function study, which was  administered by a speech pathologist.  Final results and recommendations  from this study are contained within the speech pathology report.      Microbiology: No results found for this or any previous visit (from the past 240 hour(s)).   Labs: Basic Metabolic Panel:  Lab 10/24/11 1191 10/23/11 0423 10/22/11 0428 10/21/11 0425 10/19/11 0528 10/18/11 0515  NA 139 145 147* 145 -- 144  K 3.7 4.2 4.8 4.8 -- 4.3  CL 101 104 104 103 -- 106  CO2 35* 40* 36* 36* -- 34*  GLUCOSE 395* 110* 130* 111* -- 155*  BUN 27* 35* 41* 42* -- 40*  CREATININE 1.07 1.17 1.14 1.09 1.32 --  CALCIUM 8.7 9.8 10.2 10.1 -- 9.3  MG -- -- -- -- -- --  PHOS -- -- -- -- -- --   Liver Function Tests: No results found for this basename: AST:5,ALT:5,ALKPHOS:5,BILITOT:5,PROT:5,ALBUMIN:5 in the last 168 hours No results found for this basename: LIPASE:5,AMYLASE:5 in the last 168 hours  Lab 10/21/11 1012  AMMONIA 17   CBC:  Lab 10/23/11 0423 10/20/11 0432 10/18/11 0515  WBC 10.5 11.8* 10.9*  NEUTROABS -- -- --  HGB 10.6* 11.4* 11.2*  HCT 34.1* 37.4* 35.1*  MCV 94.7 95.2 92.9  PLT 165 183 158   Cardiac Enzymes: No results found for this basename: CKTOTAL:5,CKMB:5,CKMBINDEX:5,TROPONINI:5 in the last 168 hours BNP: BNP (last 3 results)  Basename 10/23/11 0437 10/16/11 1043 10/14/11 0657  PROBNP 354.7 226.5 321.9   CBG:  Lab  10/24/11 1206 10/24/11 0727 10/23/11 2227 10/23/11 1737 10/23/11 1152  GLUCAP 116* 103* 109* 142* 111*    Time coordinating discharge: greater than 35 minutes.  Signed:  Grisell Bissette  [facility] 10/24/2011, 2:43 PM

## 2011-10-24 NOTE — Progress Notes (Signed)
Physical Therapy Treatment Patient Details Name: Harold Johnson MRN: [id number] DOB: January 05, 1935 Today's Date: 10/24/2011 Time: 4098-1191 PT Time Calculation (min): 33 min  PT Assessment / Plan / Recommendation Comments on Treatment Session  Pt has been making remarkable progress.  Per nursing report, he has been confused, but he remains cooperative with me.  Now ambulating with a walker, min guard assist.    Follow Up Recommendations       Barriers to Discharge        Equipment Recommendations       Recommendations for Other Services    Frequency     Plan Discharge plan remains appropriate;Frequency remains appropriate    Precautions / Restrictions Restrictions Weight Bearing Restrictions: No   Pertinent Vitals/Pain     Mobility  Transfers Sit to Stand: 5: Supervision;From bed;With upper extremity assist Stand to Sit: 5: Supervision;To chair/3-in-1;With upper extremity assist Ambulation/Gait Ambulation/Gait Assistance: 4: Min guard Ambulation Distance (Feet): 80 Feet Assistive device: Rolling walker Gait Pattern: Within Functional Limits Stairs: No Wheelchair Mobility Wheelchair Mobility: No    Exercises General Exercises - Lower Extremity Long Arc Quad: AROM;Both;10 reps;Seated Hip ABduction/ADduction: Strengthening;Both;10 reps;Seated Hip Flexion/Marching: AROM;Both;10 reps;Seated Toe Raises: AROM;Both;5 reps;Standing   PT Diagnosis:    PT Problem List:   PT Treatment Interventions:     PT Goals Acute Rehab PT Goals PT Goal: Rolling Supine to Right Side - Progress: Met PT Goal: Rolling Supine to Left Side - Progress: Met PT Goal: Supine/Side to Sit - Progress: Met PT Goal: Sit at Edge Of Bed - Progress: Met Pt will go Sit to Stand: with modified independence PT Goal: Sit to Stand - Progress: Updated due to goal met Pt will go Stand to Sit: with modified independence PT Goal: Stand to Sit - Progress: Updated due to goals met PT Goal: Stand - Progress: Met Pt  will Ambulate: 51 - 150 feet;with supervision PT Goal: Ambulate - Progress: Updated due to goal met  Visit Information  Last PT Received On: 10/24/11    Subjective Data  Subjective: feels pretty good   Cognition       Balance     End of Session PT - End of Session Equipment Utilized During Treatment: Gait belt Activity Tolerance: Patient tolerated treatment well Patient left: in chair;with call bell/phone within reach;with chair alarm set Nurse Communication: Mobility status   GP     Myrlene Broker L 10/24/2011, 9:08 AM

## 2011-10-24 NOTE — Clinical Social Work Note (Signed)
Patient and family informed of bed offers at [facility] and [place].  Family and patient chose [facility].  Patient has [facility], which requires prior authorization.  PT notes faxed to facility via [facility], facility aware of process and asked to initiate process ASAP.  B Ratliffe contacted for correct Medicare number.  Clovis Cao Clinical Social Worker [phone])

## 2011-10-24 NOTE — Clinical Social Work Note (Signed)
Patient received [employer] authorization to [facility] in [place].  Patient can be transported to facility via [facility] or family when discharged.  Facility aware and agreeable.  Family aware and agreeable.  FL2 updated.  Clovis Cao Clinical Social Worker [phone])

## 2011-10-24 NOTE — Progress Notes (Signed)
PICC line removed from RUA without difficulty, vaseline gauze and 4x4's applied, pressure held x 2 minutes, secured with hypafix tape, instructed patient to leave dressing in place x 24 hours, verbalized understanding.

## 2011-10-24 NOTE — Progress Notes (Signed)
Pt discharged to [facility] today per Dr. Sherrie Mustache. Pt's PICC line D/C'd and WNL. Pt's VS stable at this time. Pt's family provided with [facility] packet and given instructions to provide [facility] with said packet. Report called to Charlie Pitter, nurse at [facility]. Report received and understanding verbalized. Pt left floor via WC in stable condition accompanied by NT.

## 2011-10-28 NOTE — Progress Notes (Signed)
AT 20:50 PT WAS FOUND ON HEES KNEES IN FRONT OF THE COMMODE. PT STATED " I SLID MY SELF DOWN". NO INJURIES NOTED. PT DENIES PAIN OR HURTING ANYWHERE. SKIN ON PT'S KNEES INTACT, NO REDNESS NO SKIN TEAR. PT STATES HE DID NOT HIT HIMSELF OR BUMPED INTO ANYTHING. PT WAS HELPED UP TO THE BED AND WAS CHECKED OUT TO RULE OUT INJURY. DR. Onalee Hua WAS NOTIFIED OF INCIDENT VIA [facility] TEXT MESSAGING SYTEM. NO CALL OR ORDERS RECEIVED. PT'S WIFE CALLED AT HOME, NO ANSWER. MESSAGE LEFT ON ANSWERING MACHINE WITH PHONE NUMBER PROVIDED TO CALL BACK. INFORMED PT THAT CALL WAS PLACED TO HIS WIFE TO NOTIFY HER OF INCIDENT AND MESSAGE WAS LEFT AT HOUSE TO CALL ME BACK. PT VERBALIZED UNDERSTANDING.

## 2014-11-29 LAB — BLOOD GAS, ARTERIAL
Acid-Base Excess: 8.1 mmol/L — ABNORMAL HIGH (ref 0.0–2.0)
Bicarbonate: 32.3 mEq/L — ABNORMAL HIGH (ref 20.0–24.0)
Drawn by: 283401
FIO2: 0.4
MECHVT: 500 mL
O2 Saturation: 99.1 %
PEEP: 5 cmH2O
Patient temperature: 37
RATE: 14 resp/min
pCO2 arterial: 46.8 mmHg — ABNORMAL HIGH (ref 35.0–45.0)
pH, Arterial: 7.454 — ABNORMAL HIGH (ref 7.350–7.450)
pO2, Arterial: 155 mmHg — ABNORMAL HIGH (ref 80.0–100.0)

## 2015-12-06 ENCOUNTER — Other Ambulatory Visit: Payer: Self-pay | Admitting: Physician Assistant

## 2016-02-12 ENCOUNTER — Other Ambulatory Visit: Payer: Self-pay | Admitting: Physician Assistant

## 2017-01-01 ENCOUNTER — Emergency Department (HOSPITAL_COMMUNITY): Payer: Medicare HMO

## 2017-01-01 ENCOUNTER — Encounter (HOSPITAL_COMMUNITY): Payer: Self-pay

## 2017-01-01 ENCOUNTER — Emergency Department (HOSPITAL_COMMUNITY)
Admission: EM | Admit: 2017-01-01 | Discharge: 2017-01-01 | Discharge status: Against Medical Advice | Payer: Medicare HMO | Attending: Emergency Medicine | Admitting: Emergency Medicine

## 2017-01-01 DIAGNOSIS — Z9981 Dependence on supplemental oxygen: Secondary | ICD-10-CM | POA: Insufficient documentation

## 2017-01-01 DIAGNOSIS — R55 Syncope and collapse: Secondary | ICD-10-CM | POA: Diagnosis not present

## 2017-01-01 DIAGNOSIS — I10 Essential (primary) hypertension: Secondary | ICD-10-CM | POA: Insufficient documentation

## 2017-01-01 DIAGNOSIS — Z87891 Personal history of nicotine dependence: Secondary | ICD-10-CM | POA: Insufficient documentation

## 2017-01-01 DIAGNOSIS — Z79899 Other long term (current) drug therapy: Secondary | ICD-10-CM | POA: Diagnosis not present

## 2017-01-01 DIAGNOSIS — R0600 Dyspnea, unspecified: Secondary | ICD-10-CM | POA: Diagnosis present

## 2017-01-01 DIAGNOSIS — J9611 Chronic respiratory failure with hypoxia: Secondary | ICD-10-CM | POA: Insufficient documentation

## 2017-01-01 DIAGNOSIS — Z7982 Long term (current) use of aspirin: Secondary | ICD-10-CM | POA: Insufficient documentation

## 2017-01-01 DIAGNOSIS — R7989 Other specified abnormal findings of blood chemistry: Secondary | ICD-10-CM | POA: Diagnosis not present

## 2017-01-01 DIAGNOSIS — J441 Chronic obstructive pulmonary disease with (acute) exacerbation: Secondary | ICD-10-CM | POA: Diagnosis not present

## 2017-01-01 DIAGNOSIS — R778 Other specified abnormalities of plasma proteins: Secondary | ICD-10-CM

## 2017-01-01 LAB — CBC WITH DIFFERENTIAL/PLATELET
Basophils Absolute: 0 10*3/uL (ref 0.0–0.1)
Basophils Relative: 0 %
Eosinophils Absolute: 0.1 10*3/uL (ref 0.0–0.7)
Eosinophils Relative: 2 %
HCT: 39.4 % (ref 39.0–52.0)
Hemoglobin: 12.2 g/dL — ABNORMAL LOW (ref 13.0–17.0)
Lymphocytes Relative: 12 %
Lymphs Abs: 0.8 10*3/uL (ref 0.7–4.0)
MCH: 29.1 pg (ref 26.0–34.0)
MCHC: 31 g/dL (ref 30.0–36.0)
MCV: 94 fL (ref 78.0–100.0)
Monocytes Absolute: 0.8 10*3/uL (ref 0.1–1.0)
Monocytes Relative: 11 %
Neutro Abs: 5.3 10*3/uL (ref 1.7–7.7)
Neutrophils Relative %: 75 %
Platelets: 143 10*3/uL — ABNORMAL LOW (ref 150–400)
RBC: 4.19 MIL/uL — ABNORMAL LOW (ref 4.22–5.81)
RDW: 14.2 % (ref 11.5–15.5)
WBC: 7.1 10*3/uL (ref 4.0–10.5)

## 2017-01-01 LAB — BASIC METABOLIC PANEL
Anion gap: 9 (ref 5–15)
BUN: 34 mg/dL — ABNORMAL HIGH (ref 6–20)
CO2: 36 mmol/L — ABNORMAL HIGH (ref 22–32)
Calcium: 10.7 mg/dL — ABNORMAL HIGH (ref 8.9–10.3)
Chloride: 95 mmol/L — ABNORMAL LOW (ref 101–111)
Creatinine, Ser: 2.23 mg/dL — ABNORMAL HIGH (ref 0.61–1.24)
GFR calc Af Amer: 30 mL/min — ABNORMAL LOW (ref >60)
GFR calc non Af Amer: 26 mL/min — ABNORMAL LOW (ref >60)
Glucose, Bld: 208 mg/dL — ABNORMAL HIGH (ref 65–99)
Potassium: 3.6 mmol/L (ref 3.5–5.1)
Sodium: 140 mmol/L (ref 135–145)

## 2017-01-01 LAB — TROPONIN I: Troponin I: 0.06 ng/mL (ref <0.03)

## 2017-01-01 LAB — BRAIN NATRIURETIC PEPTIDE: B Natriuretic Peptide: 95 pg/mL (ref 0.0–100.0)

## 2017-01-01 MED ORDER — PREDNISONE 20 MG PO TABS: 40.0000 mg | ORAL_TABLET | Freq: Every day | ORAL | 0 refills | Status: DC

## 2017-01-01 MED ORDER — ASPIRIN 81 MG PO CHEW
324.0000 mg | CHEWABLE_TABLET | Freq: Once | ORAL | Status: AC
  Administered 2017-01-01: 324 mg via ORAL
  Filled 2017-01-01: qty 4

## 2017-01-01 MED ORDER — DOXYCYCLINE HYCLATE 100 MG PO CAPS: 100.0000 mg | ORAL_CAPSULE | Freq: Two times a day (BID) | ORAL | 0 refills | Status: DC

## 2017-01-01 MED ORDER — IPRATROPIUM-ALBUTEROL 0.5-2.5 (3) MG/3ML IN SOLN
3.0000 mL | Freq: Once | RESPIRATORY_TRACT | Status: AC
  Administered 2017-01-01: 3 mL via RESPIRATORY_TRACT
  Filled 2017-01-01: qty 3

## 2017-01-01 MED ORDER — METHYLPREDNISOLONE SODIUM SUCC 125 MG IJ SOLR
80.0000 mg | Freq: Once | INTRAMUSCULAR | Status: AC
  Administered 2017-01-01: 80 mg via INTRAVENOUS
  Filled 2017-01-01: qty 2

## 2017-01-01 NOTE — ED Triage Notes (Signed)
Pt has sudden onset of SOB after eating. Has a history of cardiac issues as well as COPD. On home o2 at 3 Liters. Pt is weak as well. Pt was lying back when SOB happened and was 90's. On the way to the truck pt had expiratory wheezes, but cleared up when pt got into Ambulance.   VSS Pt is 98% on 4L NAD

## 2017-01-01 NOTE — Discharge Instructions (Signed)
Call your PCP's office the first thing in the morning. I am concerned about some of the testing we did here in the emergency room today and feel that you need admission to the hospital. Although your symptoms seem consistent with a COPD exacerbation, this doesn't necessarily explain you passing outs or the abnormal blood tests.   I am concerned about your heart. Take  of aspirin daily until they can see you. I want you to be seen as soon as possible though.

## 2017-01-01 NOTE — ED Notes (Signed)
Contacted phlebotomist to come draw blood

## 2017-01-01 NOTE — ED Provider Notes (Signed)
[facility]-EMERGENCY DEPT Provider Note   CSN: [id number] Arrival date & time: 01/01/17  1713     History   Chief Complaint No chief complaint on file.   HPI Harold Johnson is a 82 y.o. male.  HPI   82yM with dyspnea.Onset after eating shortly before arrival. Now improved. Was worse when supine. Says he felt like he was wheezing initially but this has resolved. Now feeling comfortable. Denies pain.   Past Medical History:  Diagnosis Date  . Acute respiratory failure with hypercapnia (HCC) 10/2011   Status post intubation and ventilation.  . Chronic back pain   . Chronic respiratory failure with hypoxia (HCC)   . Chronic respiratory failure with hypoxia (HCC)   . COPD (chronic obstructive pulmonary disease) (HCC)    Oxygen-dependent. C02 retainer  . Elevated CK 10/13/2011   Possibly statin-induced rhabdomyolysis.  . Hypercholesteremia   . Hypertension   . Right axis deviation   . Stroke [facility])     Patient Active Problem List   Diagnosis Date Noted  . Hypernatremia 10/22/2011  . Debility 10/22/2011  . Dysphagia 10/21/2011  . Lethargy 10/21/2011  . Sinusitis 10/21/2011  . Acute renal failure (HCC) 10/15/2011  . Encephalopathy acute 10/14/2011  . Rhabdomyolysis 10/13/2011  . Hyperglycemia, drug-induced 10/13/2011  . COPD with acute exacerbation (HCC) 10/12/2011  . Tachycardia 10/12/2011  . Right axis deviation 10/12/2011  . Anemia 10/12/2011  . Chronic respiratory failure with hypoxia (HCC) 10/12/2011  . Acute respiratory failure with hypercapnia (HCC) 10/12/2011  . Chronic back pain 10/12/2011  . PNA (pneumonia) 10/12/2011  . Hypertension 10/12/2011    Past Surgical History:  Procedure Laterality Date  . fatty tumor     Excision from left shoulder.  . INGUINAL HERNIA REPAIR         Home Medications    Prior to Admission medications   Medication Sig Start Date End Date Taking? Authorizing Provider  amLODipine (NORVASC) 10 MG tablet Take 10 mg by mouth daily.     Yes [provider]  aspirin EC 81 MG tablet Take 81 mg by mouth daily.   Yes [provider]  escitalopram (LEXAPRO) 10 MG tablet Take 10 mg by mouth daily.   Yes [provider]  gabapentin (NEURONTIN) 300 MG capsule Take 1 capsule (300 mg total) by mouth at bedtime. Patient taking differently: Take 300 mg by mouth 3 (three) times daily.  10/24/11  Yes Elliot Cousin, MD  levofloxacin (LEVAQUIN) 750 MG tablet Take 750 mg by mouth daily. 10 day course starting on 12/26/2016 12/25/16  Yes [provider]  losartan-hydrochlorothiazide (HYZAAR) 100-25 MG tablet TAKE 1 TABLET EVERY DAY 12/06/15  Yes Remus Loffler, PA-C  nabumetone (RELAFEN) 500 MG tablet Take 1 tablet (500 mg total) by mouth 2 (two) times daily as needed for pain. Patient taking differently: Take 500 mg by mouth 2 (two) times daily.  10/24/11  Yes Elliot Cousin, MD  OXYGEN Inhale 2-3 L into the lungs daily.   Yes [provider]  Tiotropium Bromide-Olodaterol (STIOLTO RESPIMAT) 2.5-2.5 MCG/ACT AERS Inhale 2 puffs into the lungs daily.   Yes [provider]  VENTOLIN HFA 108 (90 Base) MCG/ACT inhaler Inhale 2 puffs into the lungs every 4 (four) hours as needed for wheezing.  12/26/16  Yes [provider]  cloNIDine (CATAPRES) 0.1 MG tablet Take 0.1 mg by mouth at bedtime.    [provider]    Family History No family history on file.  Social History Social  History  Substance Use Topics  . Smoking status: Former Games developer  . Smokeless tobacco: Never Used  . Alcohol use No     Comment: used to     Allergies   Patient has no known allergies.   Review of Systems Review of Systems  All systems reviewed and negative, other than as noted in HPI.   Physical Exam Updated Vital Signs BP (!) 142/80 (BP Location: Right Arm)   Pulse 100   Temp 98.5 F (36.9 C) (Oral)   Resp (!) 21   Ht  (1.753 m)   Wt 95.3 kg (210 lb)   SpO2 100%   BMI 31.01 kg/m     Physical Exam  Constitutional: He appears well-developed and well-nourished. No distress.  HENT:  Head: Normocephalic and atraumatic.  Eyes: Conjunctivae are normal. Right eye exhibits no discharge. Left eye exhibits no discharge.  Neck: Neck supple.  Cardiovascular: Normal rate, regular rhythm and normal heart sounds.  Exam reveals no gallop and no friction rub.   No murmur heard. Pulmonary/Chest: Effort normal. No respiratory distress. He has wheezes.  Faint expiratory wheezing. No increased WOB.   Abdominal: Soft. He exhibits no distension. There is no tenderness.  Musculoskeletal: He exhibits no edema or tenderness.  Lower extremities symmetric as compared to each other. No calf tenderness. Negative Homan's. No palpable cords.   Neurological: He is alert.  Skin: Skin is warm and dry.  Psychiatric: He has a normal mood and affect. His behavior is normal. Thought content normal.  Nursing note and vitals reviewed.    ED Treatments / Results  Labs (all labs ordered are listed, but only abnormal results are displayed) Labs Reviewed  CBC WITH DIFFERENTIAL/PLATELET - Abnormal; Notable for the following:       Result Value   RBC 4.19 (*)    Hemoglobin 12.2 (*)    Platelets 143 (*)    All other components within normal limits  BASIC METABOLIC PANEL - Abnormal; Notable for the following:    Chloride 95 (*)    CO2 36 (*)    Glucose, Bld 208 (*)    BUN 34 (*)    Creatinine, Ser 2.23 (*)    Calcium 10.7 (*)    GFR calc non Af Amer 26 (*)    GFR calc Af Amer 30 (*)    All other components within normal limits  TROPONIN I - Abnormal; Notable for the following:    Troponin I 0.06 (*)    All other components within normal limits  BRAIN NATRIURETIC PEPTIDE    EKG  EKG Interpretation  Date/Time:  Thursday January 01 2017 18:10:02 EDT Ventricular Rate:  91 PR Interval:  190 QRS Duration: 102 QT Interval:  364 QTC Calculation: 447 R Axis:   102 Text Interpretation:   Normal sinus rhythm Rightward axis ST & T wave abnormality, consider inferior ischemia Abnormal ECG Confirmed by Raeford Razor [phone]) on 01/01/2017 8:17:11 PM       Radiology Dg Chest 2 View  Result Date: 01/01/2017 CLINICAL DATA:  Sudden onset discordant of breath with wheezing. Productive cough and left-sided chest pain 2 weeks. Hypoxia. EXAM: CHEST  2 VIEW COMPARISON:  10/23/2011 FINDINGS: Lungs are adequately inflated with possible mild opacification over the anterior upper lobes on the lateral film which may be due to atelectasis or infection. No evidence of effusion. Cardiomediastinal silhouette and remainder of the exam is unchanged. IMPRESSION: Possible mild opacification over the anterior upper lungs on the lateral film  which may be due to atelectasis or infection. Electronically Signed   By: Elberta Fortis M.D.   On: 01/01/2017 19:21    Procedures Procedures (including critical care time)  Medications Ordered in ED Medications  ipratropium-albuterol (DUONEB) 0.5-2.5 (3) MG/3ML nebulizer solution 3 mL (3 mLs Nebulization Given 01/01/17 1927)  methylPREDNISolone sodium succinate (SOLU-MEDROL) 125 mg/2 mL injection 80 mg (80 mg Intravenous Given 01/01/17 1915)     Initial Impression / Assessment and Plan / ED Course  I have reviewed the triage vital signs and the nursing notes.  Pertinent labs & imaging results that were available during my care of the patient were reviewed by me and considered in my medical decision making (see chart for details).     82yM with dyspnea. Clinically COPD exacerbation and says now back to baseline. I advised admission. His symptoms seems atypical for ACS, but initial troponin is elevated, EKG is changed from prior(although from years ago), he had syncopal event and has an elevated Cr although chronicity unclear. He declines. Will leave AMA. Says he was started on medication 3 days ago after seeing PCP for URI type symptoms. He is not sure of the  medication, but once a day dosing for 10 days. I am prescribing him steroids and doxycycline for COPD exacerbation.  Unfortunately, he receives his meds mail order, his PCP's office is closed at this hour and I cannot readily see their notes. He was advised that he needs to call the office in the morning to discuss with them in terms of the meds they prescribed and what I have and he needs to be seen again promptly. Needs to take  ASA daily until he sees them and return immediately for return of worrisome symptoms.   Final Clinical Impressions(s) / ED Diagnoses   Final diagnoses:  COPD exacerbation (HCC)  Syncope, unspecified syncope type  Elevated troponin    New Prescriptions New Prescriptions   No medications on file     Raeford Razor, MD 01/20/17 1108

## 2017-01-01 NOTE — ED Notes (Signed)
Date and time results received: 01/01/17 1938 (use smartphrase ".now" to insert current time)  Test: trop Critical Value: 0.06  Name of Provider Notified: dr Juleen China  Orders Received? Or Actions Taken?: none

## 2017-01-16 ENCOUNTER — Encounter (HOSPITAL_COMMUNITY): Payer: Self-pay

## 2017-01-16 ENCOUNTER — Observation Stay (HOSPITAL_COMMUNITY)
Admission: EM | Admit: 2017-01-16 | Discharge: 2017-01-18 | Disposition: A | Discharge status: Home / Self Care | Payer: Medicare HMO | Attending: Family Medicine | Admitting: Family Medicine

## 2017-01-16 ENCOUNTER — Emergency Department (HOSPITAL_COMMUNITY): Payer: Medicare HMO

## 2017-01-16 DIAGNOSIS — R7989 Other specified abnormal findings of blood chemistry: Secondary | ICD-10-CM

## 2017-01-16 DIAGNOSIS — D631 Anemia in chronic kidney disease: Secondary | ICD-10-CM | POA: Diagnosis not present

## 2017-01-16 DIAGNOSIS — R778 Other specified abnormalities of plasma proteins: Secondary | ICD-10-CM

## 2017-01-16 DIAGNOSIS — N179 Acute kidney failure, unspecified: Secondary | ICD-10-CM | POA: Diagnosis not present

## 2017-01-16 DIAGNOSIS — Z79899 Other long term (current) drug therapy: Secondary | ICD-10-CM | POA: Diagnosis not present

## 2017-01-16 DIAGNOSIS — R748 Abnormal levels of other serum enzymes: Secondary | ICD-10-CM | POA: Diagnosis not present

## 2017-01-16 DIAGNOSIS — D649 Anemia, unspecified: Secondary | ICD-10-CM | POA: Diagnosis not present

## 2017-01-16 DIAGNOSIS — I10 Essential (primary) hypertension: Secondary | ICD-10-CM | POA: Diagnosis not present

## 2017-01-16 DIAGNOSIS — Z9981 Dependence on supplemental oxygen: Secondary | ICD-10-CM | POA: Insufficient documentation

## 2017-01-16 DIAGNOSIS — R0602 Shortness of breath: Secondary | ICD-10-CM | POA: Diagnosis present

## 2017-01-16 DIAGNOSIS — Z8673 Personal history of transient ischemic attack (TIA), and cerebral infarction without residual deficits: Secondary | ICD-10-CM | POA: Insufficient documentation

## 2017-01-16 DIAGNOSIS — Z23 Encounter for immunization: Secondary | ICD-10-CM | POA: Insufficient documentation

## 2017-01-16 DIAGNOSIS — N189 Chronic kidney disease, unspecified: Secondary | ICD-10-CM

## 2017-01-16 DIAGNOSIS — J9611 Chronic respiratory failure with hypoxia: Secondary | ICD-10-CM

## 2017-01-16 DIAGNOSIS — Z7982 Long term (current) use of aspirin: Secondary | ICD-10-CM | POA: Insufficient documentation

## 2017-01-16 DIAGNOSIS — J441 Chronic obstructive pulmonary disease with (acute) exacerbation: Secondary | ICD-10-CM | POA: Diagnosis present

## 2017-01-16 DIAGNOSIS — Z87891 Personal history of nicotine dependence: Secondary | ICD-10-CM | POA: Diagnosis not present

## 2017-01-16 HISTORY — DX: Pneumonia, unspecified organism: J18.9

## 2017-01-16 HISTORY — DX: Anemia, unspecified: D64.9

## 2017-01-16 LAB — COMPREHENSIVE METABOLIC PANEL
ALT: 54 U/L (ref 17–63)
ANION GAP: 9 (ref 5–15)
AST: 44 U/L — ABNORMAL HIGH (ref 15–41)
Albumin: 3.5 g/dL (ref 3.5–5.0)
Alkaline Phosphatase: 42 U/L (ref 38–126)
BUN: 49 mg/dL — ABNORMAL HIGH (ref 6–20)
CHLORIDE: 99 mmol/L — AB (ref 101–111)
CO2: 35 mmol/L — AB (ref 22–32)
CREATININE: 2.08 mg/dL — AB (ref 0.61–1.24)
Calcium: 10.1 mg/dL (ref 8.9–10.3)
GFR calc non Af Amer: 28 mL/min — ABNORMAL LOW (ref >60)
GFR, EST AFRICAN AMERICAN: 33 mL/min — AB (ref >60)
Glucose, Bld: 92 mg/dL (ref 65–99)
POTASSIUM: 3.4 mmol/L — AB (ref 3.5–5.1)
SODIUM: 143 mmol/L (ref 135–145)
Total Bilirubin: 1 mg/dL (ref 0.3–1.2)
Total Protein: 6.4 g/dL — ABNORMAL LOW (ref 6.5–8.1)

## 2017-01-16 LAB — CBC WITH DIFFERENTIAL/PLATELET
Basophils Absolute: 0 10*3/uL (ref 0.0–0.1)
Basophils Relative: 0 %
Eosinophils Absolute: 0.1 10*3/uL (ref 0.0–0.7)
Eosinophils Relative: 2 %
HEMATOCRIT: 38.9 % — AB (ref 39.0–52.0)
HEMOGLOBIN: 12.2 g/dL — AB (ref 13.0–17.0)
LYMPHS ABS: 1.6 10*3/uL (ref 0.7–4.0)
LYMPHS PCT: 17 %
MCH: 29.1 pg (ref 26.0–34.0)
MCHC: 31.4 g/dL (ref 30.0–36.0)
MCV: 92.8 fL (ref 78.0–100.0)
MONOS PCT: 14 %
Monocytes Absolute: 1.3 10*3/uL — ABNORMAL HIGH (ref 0.1–1.0)
NEUTROS ABS: 6.5 10*3/uL (ref 1.7–7.7)
NEUTROS PCT: 67 %
Platelets: 140 10*3/uL — ABNORMAL LOW (ref 150–400)
RBC: 4.19 MIL/uL — ABNORMAL LOW (ref 4.22–5.81)
RDW: 14.9 % (ref 11.5–15.5)
WBC: 9.6 10*3/uL (ref 4.0–10.5)

## 2017-01-16 LAB — TROPONIN I
Troponin I: 0.09 ng/mL (ref <0.03)
Troponin I: 0.13 ng/mL (ref <0.03)

## 2017-01-16 MED ORDER — AMLODIPINE BESYLATE 5 MG PO TABS
10.0000 mg | ORAL_TABLET | Freq: Every day | ORAL | Status: DC
  Administered 2017-01-17 – 2017-01-18 (×2): 10 mg via ORAL
  Filled 2017-01-16 (×2): qty 2

## 2017-01-16 MED ORDER — ALBUTEROL SULFATE (2.5 MG/3ML) 0.083% IN NEBU
2.5000 mg | INHALATION_SOLUTION | Freq: Three times a day (TID) | RESPIRATORY_TRACT | Status: DC
  Administered 2017-01-17 – 2017-01-18 (×4): 2.5 mg via RESPIRATORY_TRACT
  Filled 2017-01-16 (×4): qty 3

## 2017-01-16 MED ORDER — ASPIRIN EC 81 MG PO TBEC
81.0000 mg | DELAYED_RELEASE_TABLET | Freq: Every day | ORAL | Status: DC
  Administered 2017-01-17 – 2017-01-18 (×2): 81 mg via ORAL
  Filled 2017-01-16 (×2): qty 1

## 2017-01-16 MED ORDER — IPRATROPIUM BROMIDE 0.02 % IN SOLN: 0.5000 mg | Freq: Four times a day (QID) | RESPIRATORY_TRACT | Status: DC

## 2017-01-16 MED ORDER — POTASSIUM CHLORIDE IN NACL 20-0.9 MEQ/L-% IV SOLN
INTRAVENOUS | Status: DC
  Administered 2017-01-16 – 2017-01-17 (×2): via INTRAVENOUS

## 2017-01-16 MED ORDER — GABAPENTIN 300 MG PO CAPS
300.0000 mg | ORAL_CAPSULE | Freq: Every day | ORAL | Status: DC
  Administered 2017-01-16 – 2017-01-17 (×2): 300 mg via ORAL
  Filled 2017-01-16 (×2): qty 1

## 2017-01-16 MED ORDER — METHYLPREDNISOLONE SODIUM SUCC 125 MG IJ SOLR
125.0000 mg | Freq: Once | INTRAMUSCULAR | Status: AC
Start: 2017-01-16 — End: 2017-01-16
  Administered 2017-01-16: 125 mg via INTRAVENOUS
  Filled 2017-01-16: qty 2

## 2017-01-16 MED ORDER — ACETAMINOPHEN 325 MG PO TABS
650.0000 mg | ORAL_TABLET | Freq: Four times a day (QID) | ORAL | Status: DC | PRN
  Administered 2017-01-17: 650 mg via ORAL
  Filled 2017-01-16: qty 2

## 2017-01-16 MED ORDER — METHYLPREDNISOLONE SODIUM SUCC 40 MG IJ SOLR
40.0000 mg | Freq: Four times a day (QID) | INTRAMUSCULAR | Status: DC
  Administered 2017-01-16 – 2017-01-18 (×7): 40 mg via INTRAVENOUS
  Filled 2017-01-16 (×7): qty 1

## 2017-01-16 MED ORDER — GUAIFENESIN ER 600 MG PO TB12
600.0000 mg | ORAL_TABLET | Freq: Two times a day (BID) | ORAL | Status: DC
  Administered 2017-01-16 – 2017-01-18 (×4): 600 mg via ORAL
  Filled 2017-01-16 (×4): qty 1

## 2017-01-16 MED ORDER — IPRATROPIUM-ALBUTEROL 0.5-2.5 (3) MG/3ML IN SOLN
3.0000 mL | Freq: Four times a day (QID) | RESPIRATORY_TRACT | Status: DC
  Administered 2017-01-16: 3 mL via RESPIRATORY_TRACT

## 2017-01-16 MED ORDER — ALBUTEROL (5 MG/ML) CONTINUOUS INHALATION SOLN
10.0000 mg/h | INHALATION_SOLUTION | RESPIRATORY_TRACT | Status: AC
  Administered 2017-01-16: 10 mg/h via RESPIRATORY_TRACT
  Filled 2017-01-16: qty 20

## 2017-01-16 MED ORDER — CLONIDINE HCL 0.1 MG PO TABS
0.1000 mg | ORAL_TABLET | Freq: Every day | ORAL | Status: DC
  Administered 2017-01-16 – 2017-01-17 (×2): 0.1 mg via ORAL
  Filled 2017-01-16 (×2): qty 1

## 2017-01-16 MED ORDER — ONDANSETRON HCL 4 MG PO TABS: 4.0000 mg | ORAL_TABLET | Freq: Four times a day (QID) | ORAL | Status: DC | PRN

## 2017-01-16 MED ORDER — ENOXAPARIN SODIUM 40 MG/0.4ML ~~LOC~~ SOLN
40.0000 mg | SUBCUTANEOUS | Status: DC
  Administered 2017-01-16: 40 mg via SUBCUTANEOUS
  Filled 2017-01-16 (×2): qty 0.4

## 2017-01-16 MED ORDER — ALBUTEROL SULFATE (2.5 MG/3ML) 0.083% IN NEBU: 2.5000 mg | INHALATION_SOLUTION | Freq: Four times a day (QID) | RESPIRATORY_TRACT | Status: DC

## 2017-01-16 MED ORDER — INFLUENZA VAC SPLIT HIGH-DOSE 0.5 ML IM SUSY
0.5000 mL | PREFILLED_SYRINGE | INTRAMUSCULAR | Status: AC
  Administered 2017-01-17: 0.5 mL via INTRAMUSCULAR
  Filled 2017-01-16: qty 0.5

## 2017-01-16 MED ORDER — DOCUSATE SODIUM 100 MG PO CAPS
100.0000 mg | ORAL_CAPSULE | Freq: Two times a day (BID) | ORAL | Status: DC
  Administered 2017-01-16 – 2017-01-18 (×3): 100 mg via ORAL
  Filled 2017-01-16 (×4): qty 1

## 2017-01-16 MED ORDER — ACETAMINOPHEN 650 MG RE SUPP: 650.0000 mg | Freq: Four times a day (QID) | RECTAL | Status: DC | PRN

## 2017-01-16 MED ORDER — UMECLIDINIUM BROMIDE 62.5 MCG/INH IN AEPB
1.0000 | INHALATION_SPRAY | Freq: Every day | RESPIRATORY_TRACT | Status: DC
  Administered 2017-01-17 – 2017-01-18 (×2): 1 via RESPIRATORY_TRACT
  Filled 2017-01-16: qty 7

## 2017-01-16 MED ORDER — ESCITALOPRAM OXALATE 10 MG PO TABS
10.0000 mg | ORAL_TABLET | Freq: Every day | ORAL | Status: DC
  Administered 2017-01-17 – 2017-01-18 (×2): 10 mg via ORAL
  Filled 2017-01-16 (×2): qty 1

## 2017-01-16 MED ORDER — ONDANSETRON HCL 4 MG/2ML IJ SOLN: 4.0000 mg | Freq: Four times a day (QID) | INTRAMUSCULAR | Status: DC | PRN

## 2017-01-16 MED ORDER — ARFORMOTEROL TARTRATE 15 MCG/2ML IN NEBU
15.0000 ug | INHALATION_SOLUTION | Freq: Two times a day (BID) | RESPIRATORY_TRACT | Status: DC
  Administered 2017-01-16 – 2017-01-18 (×4): 15 ug via RESPIRATORY_TRACT
  Filled 2017-01-16 (×3): qty 2

## 2017-01-16 NOTE — ED Notes (Signed)
Hospitalist at bedside 

## 2017-01-16 NOTE — ED Notes (Signed)
CRITICAL VALUE ALERT  Critical Value: troponin 0.09  Date & Time Notied:  01/16/17 1148  Provider Notified:dr miller   Orders Received/Actions taken:

## 2017-01-16 NOTE — H&P (Signed)
History and Physical  Harold Johnson ZOX:[id number] DOB: 12-02-1934 DOA: 01/16/2017  Referring physician: Hyacinth Meeker PCP: Samuel Jester, DO   Chief Complaint: SOB  HPI: Harold Johnson is a 82 y.o. male presented to ED today with shortness of breath that worsened acutely this morning. He  has a history of chronic obstructive pulmonary disease and wears chronic oxygen. The paramedics reported that the patient did not wear oxygen the entire night as he was trying to conserve his tank as he was running low. The patient denies this and states that he was on oxygen all night long and just became more short of breath today.   The patient says that he just completed a course of antibiotics today for treating a pneumonia.  He denies chest pain fevers but has had some nonproductive coughing. There is no swelling of the legs. The symptoms became worsened throughout the morning and EMS apparently was called to his home multiple times. The paramedics reported that there was some type of syncopal episode, the patient does not recall this and there are no witnesses present with him in the ED.  He was noted to have renal insufficiency in ED but the acuity is unknown as the last lab values were from 2013 and the renal function for fairly normal at that time.  ED asked that patient be admitted for observation.    Review of Systems: All systems reviewed and apart from history of presenting illness, are negative.  Past Medical History:  Diagnosis Date  . Acute respiratory failure with hypercapnia (HCC) 10/2011   Status post intubation and ventilation.  . Anemia 10/12/2011  . Chronic back pain   . Chronic respiratory failure with hypoxia (HCC)   . Chronic respiratory failure with hypoxia (HCC)   . COPD (chronic obstructive pulmonary disease) (HCC)    Oxygen-dependent. C02 retainer  . Elevated CK 10/13/2011   Possibly statin-induced rhabdomyolysis.  . Hypercholesteremia   . Hypertension   . Pneumonia   . Right axis  deviation   . Stroke [facility])    Past Surgical History:  Procedure Laterality Date  . fatty tumor     Excision from left shoulder.  . INGUINAL HERNIA REPAIR     Social History:  reports that he has quit smoking. He has never used smokeless tobacco. He reports that he does not drink alcohol or use drugs.  No Known Allergies  History reviewed. No pertinent family history.  Prior to Admission medications   Medication Sig Start Date End Date Taking? Authorizing Provider  amLODipine (NORVASC) 10 MG tablet Take 10 mg by mouth daily.    Yes [provider]  aspirin EC 81 MG tablet Take 81 mg by mouth daily.   Yes [provider]  cloNIDine (CATAPRES) 0.1 MG tablet Take 0.1 mg by mouth at bedtime.   Yes [provider]  escitalopram (LEXAPRO) 10 MG tablet Take 10 mg by mouth daily.   Yes [provider]  gabapentin (NEURONTIN) 300 MG capsule Take 1 capsule (300 mg total) by mouth at bedtime. Patient taking differently: Take 300 mg by mouth 3 (three) times daily.  10/24/11  Yes Elliot Cousin, MD  levofloxacin (LEVAQUIN) 750 MG tablet Take 750 mg by mouth daily. 01/07/17  Yes [provider]  losartan-hydrochlorothiazide (HYZAAR) 100-25 MG tablet TAKE 1 TABLET EVERY DAY 12/06/15  Yes Remus Loffler, PA-C  nabumetone (RELAFEN) 500 MG tablet Take 1 tablet (500 mg total) by mouth 2 (two) times daily as needed for  pain. Patient taking differently: Take 500 mg by mouth 2 (two) times daily.  10/24/11  Yes Elliot Cousin, MD  OXYGEN Inhale 2-3 L into the lungs daily.   Yes [provider]  Tiotropium Bromide-Olodaterol (STIOLTO RESPIMAT) 2.5-2.5 MCG/ACT AERS Inhale 2 puffs into the lungs daily.   Yes [provider]  VENTOLIN HFA 108 (90 Base) MCG/ACT inhaler Inhale 2 puffs into the lungs every 4 (four) hours as needed for wheezing.  12/26/16  Yes [provider]   Physical Exam: Vitals:   01/16/17 0948 01/16/17 0949 01/16/17 1027  BP:   138/83   Pulse:  94   Resp:  16   Temp:  98.2 F (36.8 C)   TempSrc:  Oral   SpO2:  99% 96%  Weight: 95.3 kg (210 lb)    Height:  (1.753 m)       General exam: Moderately built and nourished patient, lying comfortably supine on the gurney in no obvious distress.  Head, eyes and ENT: Nontraumatic and normocephalic. Pupils equally reacting to light and accommodation. Oral mucosa dry.  Neck: Supple. No JVD, carotid bruit or thyromegaly.  Lymphatics: No lymphadenopathy.  Respiratory system:poor air movement, bibasilar exp wheezes heard.  Cardiovascular system: S1 and S2 heard. No JVD, murmurs, gallops, clicks or pedal edema.  Gastrointestinal system: Abdomen is nondistended, soft and nontender. Normal bowel sounds heard. No organomegaly or masses appreciated.  Central nervous system: Alert and oriented. Resting tremor noted (recently completed neb).  No focal neurological deficits.  Extremities: Symmetric 5 x 5 power. Peripheral pulses symmetrically felt.   Skin: No rashes or acute findings.  Musculoskeletal system: Negative exam.  Psychiatry: Pleasant and cooperative.  Labs on Admission:  Basic Metabolic Panel:  Recent Labs Lab 01/16/17 1032  NA 143  K 3.4*  CL 99*  CO2 35*  GLUCOSE 92  BUN 49*  CREATININE 2.08*  CALCIUM 10.1   Liver Function Tests:  Recent Labs Lab 01/16/17 1032  AST 44*  ALT 54  ALKPHOS 42  BILITOT 1.0  PROT 6.4*  ALBUMIN 3.5   No results for input(s): LIPASE, AMYLASE in the last 168 hours. No results for input(s): AMMONIA in the last 168 hours. CBC:  Recent Labs Lab 01/16/17 1032  WBC 9.6  NEUTROABS 6.5  HGB 12.2*  HCT 38.9*  MCV 92.8  PLT 140*   Cardiac Enzymes:  Recent Labs Lab 01/16/17 1032  TROPONINI 0.09*    BNP (last 3 results) No results for input(s): PROBNP in the last 8760 hours. CBG: No results for input(s): GLUCAP in the last 168 hours.  Radiological Exams on Admission: Dg Chest Port 1  View  Result Date: 01/16/2017 CLINICAL DATA:  Sob/low o2 sats/recent pneumonia/copd/htn/ex smoker EXAM: PORTABLE CHEST 1 VIEW COMPARISON:  01/01/2017 FINDINGS: Cardiac silhouette is mildly enlarged. No mediastinal or hilar masses or convincing adenopathy. Lungs are clear.  No pleural effusion or pneumothorax. Skeletal structures are grossly intact. IMPRESSION: 1. No acute cardiopulmonary disease. Electronically Signed   By: Amie Portland M.D.   On: 01/16/2017 10:20    EKG: Independently reviewed.   Assessment/Plan Principal Problem:   COPD exacerbation (HCC) Active Problems:   Anemia   Chronic respiratory failure with hypoxia (HCC)   AKI (acute kidney injury) (HCC)   Supplemental oxygen dependent   Elevated troponin   1. COPD exacerbation - Pt recently completed antibiotics today so will not reorder antibiotics, will treat with steroids and nebs, continuous oxygen supplementation.  Resume home respiratory medications.  IV solumedrol given in ED and will schedule lower dose around the clock.  2. Anemia of CKD - will follow.  3. AKI - suspected mild prerenal injury from dehydration, will gently hydrate overnight and reassess renal function in AM.  4. Elevated troponin - suspect some demand ischemia - no active chest pain reported and no acute EKG change.   DVT Prophylaxis: lovenox Code Status: full   Family Communication: none present  Disposition Plan: home tomorrow if stable.   Time spent: 57 mins  Standley Dakins, MD [facility] Pager [phone]  If 7PM-7AM, please contact night-coverage [email or web address] Password [email or web address] 01/16/2017, 12:46 PM

## 2017-01-16 NOTE — ED Notes (Signed)
Report given to 300 RN. 

## 2017-01-16 NOTE — ED Triage Notes (Signed)
Pt called EMS due to SOB. Pt was attempting to conserve O2 and cut O2 off. sats 80 % at time. EMS gave a reserve tank. Then called back out due to sob and per wife patient went to move from chair and "went out". Called EMS again. sats 85 % on O2. Pt given neb and feeling better. Has recent tx of pneumonia. Completed abt this am.sats up to 100% per EMS

## 2017-01-16 NOTE — Progress Notes (Signed)
CRITICAL VALUE ALERT  Critical Value:  Tropi 0.13  Date & Time Notied: 01/16/17 2210  Provider Notified: Dr. Delrae Rend  Orders Received/Actions taken: Patient asymptomatic and no CP will follow up to see what next Tropi level is per MD.

## 2017-01-16 NOTE — ED Provider Notes (Signed)
[facility]Y DEPT Provider Note   CSN: [id number] Arrival date & time: 01/16/17  0946     History   Chief Complaint Chief Complaint  Patient presents with  . Shortness of Breath    HPI Harold Johnson is a 82 y.o. male.  HPI  The patient is an 82 year old male, he presents with shortness of breath this morning. He reports that he has a history of chronic obstructive pulmonary disease and wears chronic oxygen. There is a differing story between the patient and the paramedics. Paramedics report that the patient did not wear oxygen the entire night as he was trying to conserve his tank as he was running low. The patient denies this and states that he was on oxygen all night long and just became more short of breath today. He denies chest pain fevers but has had some coughing. There is no swelling of the legs. The symptoms became worsened throughout the evening in the morning. The paramedics reported that there was some type of syncopal episode, the patient does not recall this. He does report that he was recently treated for pneumonia with antibiotics, he does not recall the name of them but finished the last dose this morning. He cannot recall the last time he was on prednisone.  Past Medical History:  Diagnosis Date  . Acute respiratory failure with hypercapnia (HCC) 10/2011   Status post intubation and ventilation.  . Chronic back pain   . Chronic respiratory failure with hypoxia (HCC)   . Chronic respiratory failure with hypoxia (HCC)   . COPD (chronic obstructive pulmonary disease) (HCC)    Oxygen-dependent. C02 retainer  . Elevated CK 10/13/2011   Possibly statin-induced rhabdomyolysis.  . Hypercholesteremia   . Hypertension   . Pneumonia   . Right axis deviation   . Stroke [facility])     Patient Active Problem List   Diagnosis Date Noted  . Hypernatremia 10/22/2011  . Debility 10/22/2011  . Dysphagia 10/21/2011  . Lethargy 10/21/2011  . Sinusitis 10/21/2011  . Acute renal  failure (HCC) 10/15/2011  . Encephalopathy acute 10/14/2011  . Rhabdomyolysis 10/13/2011  . Hyperglycemia, drug-induced 10/13/2011  . COPD with acute exacerbation (HCC) 10/12/2011  . Tachycardia 10/12/2011  . Right axis deviation 10/12/2011  . Anemia 10/12/2011  . Chronic respiratory failure with hypoxia (HCC) 10/12/2011  . Acute respiratory failure with hypercapnia (HCC) 10/12/2011  . Chronic back pain 10/12/2011  . PNA (pneumonia) 10/12/2011  . Hypertension 10/12/2011    Past Surgical History:  Procedure Laterality Date  . fatty tumor     Excision from left shoulder.  . INGUINAL HERNIA REPAIR         Home Medications    Prior to Admission medications   Medication Sig Start Date End Date Taking? Authorizing Provider  amLODipine (NORVASC) 10 MG tablet Take 10 mg by mouth daily.    Yes [provider]  aspirin EC 81 MG tablet Take 81 mg by mouth daily.   Yes [provider]  cloNIDine (CATAPRES) 0.1 MG tablet Take 0.1 mg by mouth at bedtime.   Yes [provider]  escitalopram (LEXAPRO) 10 MG tablet Take 10 mg by mouth daily.   Yes [provider]  gabapentin (NEURONTIN) 300 MG capsule Take 1 capsule (300 mg total) by mouth at bedtime. Patient taking differently: Take 300 mg by mouth 3 (three) times daily.  10/24/11  Yes Elliot Cousin, MD  levofloxacin (LEVAQUIN) 750 MG tablet Take 750 mg by mouth daily. 01/07/17  Yes  [provider]  losartan-hydrochlorothiazide (HYZAAR) 100-25 MG tablet TAKE 1 TABLET EVERY DAY 12/06/15  Yes Remus Loffler, PA-C  nabumetone (RELAFEN) 500 MG tablet Take 1 tablet (500 mg total) by mouth 2 (two) times daily as needed for pain. Patient taking differently: Take 500 mg by mouth 2 (two) times daily.  10/24/11  Yes Elliot Cousin, MD  OXYGEN Inhale 2-3 L into the lungs daily.   Yes [provider]  Tiotropium Bromide-Olodaterol (STIOLTO RESPIMAT) 2.5-2.5 MCG/ACT AERS Inhale 2 puffs into the lungs  daily.   Yes [provider]  VENTOLIN HFA 108 (90 Base) MCG/ACT inhaler Inhale 2 puffs into the lungs every 4 (four) hours as needed for wheezing.  12/26/16  Yes [provider]  doxycycline (VIBRAMYCIN) 100 MG capsule Take 1 capsule (100 mg total) by mouth 2 (two) times daily. Patient not taking: Reported on 01/16/2017 01/01/17   Raeford Razor, MD  predniSONE (DELTASONE) 20 MG tablet Take 2 tablets (40 mg total) by mouth daily. Patient not taking: Reported on 01/16/2017 01/01/17   Raeford Razor, MD    Family History No family history on file.  Social History Social History  Substance Use Topics  . Smoking status: Former Games developer  . Smokeless tobacco: Never Used  . Alcohol use No     Comment: used to     Allergies   Patient has no known allergies.   Review of Systems Review of Systems  All other systems reviewed and are negative.    Physical Exam Updated Vital Signs BP 138/83 (BP Location: Right Arm)   Pulse 94   Temp 98.2 F (36.8 C) (Oral)   Resp 16   Ht  (1.753 m)   Wt 95.3 kg (210 lb)   SpO2 96%   BMI 31.01 kg/m   Physical Exam  Constitutional: He appears well-developed and well-nourished. No distress.  HENT:  Head: Normocephalic and atraumatic.  Mouth/Throat: Oropharynx is clear and moist. No oropharyngeal exudate.  Eyes: Pupils are equal, round, and reactive to light. Conjunctivae and EOM are normal. Right eye exhibits no discharge. Left eye exhibits no discharge. No scleral icterus.  Neck: Normal range of motion. Neck supple. No JVD present. No thyromegaly present.  Cardiovascular: Normal rate, regular rhythm, normal heart sounds and intact distal pulses.  Exam reveals no gallop and no friction rub.   No murmur heard. Pulmonary/Chest: He has wheezes. He has no rales.  Mildly tachypneic, diffuse expiratory wheezing  Abdominal: Soft. Bowel sounds are normal. He exhibits no distension and no mass. There is no tenderness.    Musculoskeletal: Normal range of motion. He exhibits no edema or tenderness.  Lymphadenopathy:    He has no cervical adenopathy.  Neurological: He is alert. Coordination normal.  Skin: Skin is warm and dry. No rash noted. No erythema.  Psychiatric: He has a normal mood and affect. His behavior is normal.  Nursing note and vitals reviewed.    ED Treatments / Results  Labs (all labs ordered are listed, but only abnormal results are displayed) Labs Reviewed  COMPREHENSIVE METABOLIC PANEL - Abnormal; Notable for the following:       Result Value   Potassium 3.4 (*)    Chloride 99 (*)    CO2 35 (*)    BUN 49 (*)    Creatinine, Ser 2.08 (*)    Total Protein 6.4 (*)    AST 44 (*)    GFR calc non Af Amer 28 (*)    GFR calc  Af Amer 33 (*)    All other components within normal limits  CBC WITH DIFFERENTIAL/PLATELET - Abnormal; Notable for the following:    RBC 4.19 (*)    Hemoglobin 12.2 (*)    HCT 38.9 (*)    Platelets 140 (*)    Monocytes Absolute 1.3 (*)    All other components within normal limits  TROPONIN I - Abnormal; Notable for the following:    Troponin I 0.09 (*)    All other components within normal limits    EKG  EKG Interpretation  Date/Time:  Friday January 16 2017 10:16:33 EDT Ventricular Rate:  90 PR Interval:  186 QRS Duration: 100 QT Interval:  354 QTC Calculation: 433 R Axis:   104 Text Interpretation:  Sinus rhythm with Premature supraventricular complexes Rightward axis T wave abnormality, consider inferior ischemia Abnormal ECG since last tracing no significant change Confirmed by Eber Hong ([id number]) on 01/16/2017 10:21:03 AM       Radiology Dg Chest Port 1 View  Result Date: 01/16/2017 CLINICAL DATA:  Sob/low o2 sats/recent pneumonia/copd/htn/ex smoker EXAM: PORTABLE CHEST 1 VIEW COMPARISON:  01/01/2017 FINDINGS: Cardiac silhouette is mildly enlarged. No mediastinal or hilar masses or convincing adenopathy. Lungs are clear.  No pleural  effusion or pneumothorax. Skeletal structures are grossly intact. IMPRESSION: 1. No acute cardiopulmonary disease. Electronically Signed   By: Amie Portland M.D.   On: 01/16/2017 10:20    Procedures Procedures (including critical care time)  Medications Ordered in ED Medications  albuterol (PROVENTIL,VENTOLIN) solution continuous neb (10 mg/hr Nebulization New Bag/Given 01/16/17 1027)  methylPREDNISolone sodium succinate (SOLU-MEDROL) 125 mg/2 mL injection 125 mg (125 mg Intravenous Given 01/16/17 1119)     Initial Impression / Assessment and Plan / ED Course  I have reviewed the triage vital signs and the nursing notes.  Pertinent labs & imaging results that were available during my care of the patient were reviewed by me and considered in my medical decision making (see chart for details).    There is wheezing which is significant, the lungs are tight and moving very little air, he is mildly tachypneic. We'll obtain chest x-ray, continuous nebulizer, EKG and labs. No fever, no tachycardia, no edema, no signs of congestive heart failure  He feels better on Oxygen.  States medics gave nebs with some improvement  Troponin is elevated although mildly, acute kidney injury is similar to 2 weeks ago, BUN is slightly elevated.  Chest x-ray without acute infiltrate  Discussed with the hospitalist who will admit the patient at hospital for observation and serial troponins  Final Clinical Impressions(s) / ED Diagnoses   Final diagnoses:  Elevated troponin  AKI (acute kidney injury) (HCC)  Shortness of breath    New Prescriptions New Prescriptions   No medications on file     Eber Hong, MD 01/16/17 1230

## 2017-01-17 DIAGNOSIS — R748 Abnormal levels of other serum enzymes: Secondary | ICD-10-CM | POA: Diagnosis not present

## 2017-01-17 DIAGNOSIS — N179 Acute kidney failure, unspecified: Secondary | ICD-10-CM | POA: Diagnosis not present

## 2017-01-17 DIAGNOSIS — N189 Chronic kidney disease, unspecified: Secondary | ICD-10-CM | POA: Diagnosis not present

## 2017-01-17 DIAGNOSIS — J441 Chronic obstructive pulmonary disease with (acute) exacerbation: Secondary | ICD-10-CM | POA: Diagnosis not present

## 2017-01-17 LAB — TROPONIN I: Troponin I: 0.09 ng/mL (ref <0.03)

## 2017-01-17 LAB — COMPREHENSIVE METABOLIC PANEL
ALBUMIN: 3.1 g/dL — AB (ref 3.5–5.0)
ALK PHOS: 38 U/L (ref 38–126)
ALT: 47 U/L (ref 17–63)
AST: 32 U/L (ref 15–41)
Anion gap: 9 (ref 5–15)
BILIRUBIN TOTAL: 0.9 mg/dL (ref 0.3–1.2)
BUN: 45 mg/dL — AB (ref 6–20)
CALCIUM: 9.8 mg/dL (ref 8.9–10.3)
CO2: 34 mmol/L — ABNORMAL HIGH (ref 22–32)
Chloride: 99 mmol/L — ABNORMAL LOW (ref 101–111)
Creatinine, Ser: 1.82 mg/dL — ABNORMAL HIGH (ref 0.61–1.24)
GFR calc Af Amer: 39 mL/min — ABNORMAL LOW (ref >60)
GFR calc non Af Amer: 33 mL/min — ABNORMAL LOW (ref >60)
GLUCOSE: 134 mg/dL — AB (ref 65–99)
Potassium: 4.3 mmol/L (ref 3.5–5.1)
Sodium: 142 mmol/L (ref 135–145)
TOTAL PROTEIN: 5.7 g/dL — AB (ref 6.5–8.1)

## 2017-01-17 LAB — CBC WITH DIFFERENTIAL/PLATELET
BASOS ABS: 0 10*3/uL (ref 0.0–0.1)
BASOS PCT: 0 %
EOS PCT: 0 %
Eosinophils Absolute: 0 10*3/uL (ref 0.0–0.7)
HEMATOCRIT: 36.6 % — AB (ref 39.0–52.0)
HEMOGLOBIN: 11.4 g/dL — AB (ref 13.0–17.0)
LYMPHS PCT: 4 %
Lymphs Abs: 0.3 10*3/uL — ABNORMAL LOW (ref 0.7–4.0)
MCH: 29.2 pg (ref 26.0–34.0)
MCHC: 31.1 g/dL (ref 30.0–36.0)
MCV: 93.8 fL (ref 78.0–100.0)
MONO ABS: 0.3 10*3/uL (ref 0.1–1.0)
MONOS PCT: 4 %
Neutro Abs: 5.7 10*3/uL (ref 1.7–7.7)
Neutrophils Relative %: 92 %
Platelets: 138 10*3/uL — ABNORMAL LOW (ref 150–400)
RBC: 3.9 MIL/uL — ABNORMAL LOW (ref 4.22–5.81)
RDW: 15 % (ref 11.5–15.5)
WBC: 6.2 10*3/uL (ref 4.0–10.5)

## 2017-01-17 MED ORDER — HYDRALAZINE HCL 20 MG/ML IJ SOLN: 10.0000 mg | INTRAMUSCULAR | Status: DC | PRN

## 2017-01-17 NOTE — Progress Notes (Signed)
CRITICAL VALUE ALERT  Critical Value: Troponin 0.09  Date & Time Notied:  01/17/2017 1610  Provider Notified: Laural Benes, MD  Orders Received/Actions taken: Awaiting further instructions

## 2017-01-17 NOTE — Progress Notes (Addendum)
Patient from home, would DC home alone, wife staying in [place] with daughter. Patient uses O2 at 3L and nebulizer 4X a day. Currently patient does not have power. Patient could have MM size tanks delivered by [facility] that would last approx 9 hours on 3L, however he does not have cell phone, or any means of contacting [facility] for replacement tanks or ability to use nebulizer if DC'd prior to power restored. Patient has portable tank in room that is half full and would give him only around an hours use once home. Anticipate him to stay until power restored.

## 2017-01-17 NOTE — Progress Notes (Signed)
PROGRESS NOTE    Harold Johnson  RUE:[id number]  DOB: 06-03-1934  DOA: 01/16/2017 PCP: Samuel Jester, DO   Brief Admission Hx: 82 year old gentleman with COPD and chronic oxygen dependence presented with acute COPD exacerbation.  MDM/Assessment & Plan:   1. COPD exacerbation - pt says that he is at his baseline this morning.  Continue to treat with steroids and nebs, continuous oxygen supplementation.  Resume home respiratory medications.  Unfortunately he has no power at home and cannot be supplied with adequate portable oxygen per care manager. Will need to stay in hospital until power at home restored as he has to have continuous oxygen supply.   2. Anemia of CKD - will follow.  3. AKI - suspected mild prerenal injury from dehydration, will gently hydrate overnight and reassess renal function in AM.  4. Elevated troponin - suspect some demand ischemia - no active chest pain reported and no acute EKG change.   DVT Prophylaxis: lovenox Code Status: full   Family Communication: none present  Disposition Plan: home when power restored.  Subjective: Pt says he is at his baseline.   Objective: Vitals:   01/16/17 2300 01/17/17 0505 01/17/17 0743 01/17/17 1120  BP:  (!) 159/89    Pulse:  67    Resp:  18    Temp:  97.6 F (36.4 C)    TempSrc:  Oral    SpO2: 100% 100% 100% 98%  Weight:      Height:        Intake/Output Summary (Last 24 hours) at 01/17/17 1127 Last data filed at 01/17/17 0900  Gross per 24 hour  Intake              900 ml  Output                0 ml  Net              900 ml   Filed Weights   01/16/17 0948 01/16/17 2057  Weight: 95.3 kg (210 lb) 91.6 kg (201 lb 14.4 oz)     REVIEW OF SYSTEMS  As per history otherwise all reviewed and reported negative  Exam:  General exam: Awake, alert, sitting up, no apparent distress, cooperative. Respiratory system: Bibasilar expiratory wheezes heard. No increased work of breathing. Cardiovascular system: S1  & S2 heard.  Gastrointestinal system: Abdomen is nondistended, soft and nontender. Normal bowel sounds heard. Central nervous system: Alert and oriented. No focal neurological deficits. Extremities: no CCE.  Data Reviewed: Basic Metabolic Panel:  Recent Labs Lab 01/16/17 1032 01/17/17 0424  NA 143 142  K 3.4* 4.3  CL 99* 99*  CO2 35* 34*  GLUCOSE 92 134*  BUN 49* 45*  CREATININE 2.08* 1.82*  CALCIUM 10.1 9.8   Liver Function Tests:  Recent Labs Lab 01/16/17 1032 01/17/17 0424  AST 44* 32  ALT 54 47  ALKPHOS 42 38  BILITOT 1.0 0.9  PROT 6.4* 5.7*  ALBUMIN 3.5 3.1*   No results for input(s): LIPASE, AMYLASE in the last 168 hours. No results for input(s): AMMONIA in the last 168 hours. CBC:  Recent Labs Lab 01/16/17 1032 01/17/17 0424  WBC 9.6 6.2  NEUTROABS 6.5 5.7  HGB 12.2* 11.4*  HCT 38.9* 36.6*  MCV 92.8 93.8  PLT 140* 138*   Cardiac Enzymes:  Recent Labs Lab 01/16/17 1032 01/16/17 2111 01/17/17 0424  TROPONINI 0.09* 0.13* 0.09*   CBG (last 3)  No results for input(s): GLUCAP in the last 72  hours. No results found for this or any previous visit (from the past 240 hour(s)).   Studies: Dg Chest Port 1 View  Result Date: 01/16/2017 CLINICAL DATA:  Sob/low o2 sats/recent pneumonia/copd/htn/ex smoker EXAM: PORTABLE CHEST 1 VIEW COMPARISON:  01/01/2017 FINDINGS: Cardiac silhouette is mildly enlarged. No mediastinal or hilar masses or convincing adenopathy. Lungs are clear.  No pleural effusion or pneumothorax. Skeletal structures are grossly intact. IMPRESSION: 1. No acute cardiopulmonary disease. Electronically Signed   By: Amie Portland M.D.   On: 01/16/2017 10:20     Scheduled Meds: . albuterol  2.5 mg Nebulization TID  . amLODipine  10 mg Oral Daily  . arformoterol  15 mcg Nebulization BID  . aspirin EC  81 mg Oral Daily  . cloNIDine  0.1 mg Oral QHS  . docusate sodium  100 mg Oral BID  . enoxaparin (LOVENOX) injection  40 mg Subcutaneous  Q24H  . escitalopram  10 mg Oral Daily  . gabapentin  300 mg Oral QHS  . guaiFENesin  600 mg Oral BID  . methylPREDNISolone (SOLU-MEDROL) injection  40 mg Intravenous Q6H  . umeclidinium bromide  1 puff Inhalation Daily   Continuous Infusions:  Principal Problem:   COPD exacerbation (HCC) Active Problems:   Anemia   Chronic respiratory failure with hypoxia (HCC)   AKI (acute kidney injury) (HCC)   Supplemental oxygen dependent   Elevated troponin  Time spent:   Standley Dakins, MD, FAAFP [facility] Pager [phone]  If 7PM-7AM, please contact night-coverage [email or web address] Password [email or web address] 01/17/2017, 11:27 AM    LOS: 0 days

## 2017-01-18 ENCOUNTER — Encounter (HOSPITAL_COMMUNITY): Payer: Self-pay | Admitting: Family Medicine

## 2017-01-18 DIAGNOSIS — R748 Abnormal levels of other serum enzymes: Secondary | ICD-10-CM | POA: Diagnosis not present

## 2017-01-18 DIAGNOSIS — J441 Chronic obstructive pulmonary disease with (acute) exacerbation: Secondary | ICD-10-CM | POA: Diagnosis not present

## 2017-01-18 DIAGNOSIS — N189 Chronic kidney disease, unspecified: Secondary | ICD-10-CM | POA: Diagnosis not present

## 2017-01-18 DIAGNOSIS — N179 Acute kidney failure, unspecified: Secondary | ICD-10-CM | POA: Diagnosis not present

## 2017-01-18 LAB — COMPREHENSIVE METABOLIC PANEL
ALT: 45 U/L (ref 17–63)
AST: 40 U/L (ref 15–41)
Albumin: 2.9 g/dL — ABNORMAL LOW (ref 3.5–5.0)
Alkaline Phosphatase: 37 U/L — ABNORMAL LOW (ref 38–126)
Anion gap: 5 (ref 5–15)
BUN: 45 mg/dL — ABNORMAL HIGH (ref 6–20)
CHLORIDE: 97 mmol/L — AB (ref 101–111)
CO2: 37 mmol/L — AB (ref 22–32)
CREATININE: 1.59 mg/dL — AB (ref 0.61–1.24)
Calcium: 10 mg/dL (ref 8.9–10.3)
GFR, EST AFRICAN AMERICAN: 46 mL/min — AB (ref >60)
GFR, EST NON AFRICAN AMERICAN: 39 mL/min — AB (ref >60)
Glucose, Bld: 129 mg/dL — ABNORMAL HIGH (ref 65–99)
POTASSIUM: 4.4 mmol/L (ref 3.5–5.1)
SODIUM: 139 mmol/L (ref 135–145)
Total Bilirubin: 0.6 mg/dL (ref 0.3–1.2)
Total Protein: 5.5 g/dL — ABNORMAL LOW (ref 6.5–8.1)

## 2017-01-18 MED ORDER — PREDNISONE 20 MG PO TABS: ORAL_TABLET | ORAL | 0 refills | Status: DC

## 2017-01-18 MED ORDER — GUAIFENESIN ER 600 MG PO TB12: 1200.0000 mg | ORAL_TABLET | Freq: Two times a day (BID) | ORAL | 0 refills | Status: AC

## 2017-01-18 MED ORDER — GUAIFENESIN ER 600 MG PO TB12: 1200.0000 mg | ORAL_TABLET | Freq: Two times a day (BID) | ORAL | 0 refills | Status: DC

## 2017-01-18 MED ORDER — GUAIFENESIN 100 MG/5ML PO SOLN
5.0000 mL | ORAL | Status: DC | PRN
  Administered 2017-01-18: 100 mg via ORAL
  Filled 2017-01-18: qty 5

## 2017-01-18 NOTE — Discharge Instructions (Signed)
Follow with Primary MD  Samuel Jester, DO  and other consultant's as instructed your Hospitalist MD  Please get a complete blood count and chemistry panel checked by your Primary MD at your next visit, and again as instructed by your Primary MD.  Get Medicines reviewed and adjusted: Please take all your medications with you for your next visit with your Primary MD  Laboratory/radiological data: Please request your Primary MD to go over all hospital tests and procedure/radiological results at the follow up, please ask your Primary MD to get all Hospital records sent to his/her office.  In some cases, they will be blood work, cultures and biopsy results pending at the time of your discharge. Please request that your primary care M.D. follows up on these results.  Also Note the following: If you experience worsening of your admission symptoms, develop shortness of breath, life threatening emergency, suicidal or homicidal thoughts you must seek medical attention immediately by calling 911 or calling your MD immediately  if symptoms less severe.  You must read complete instructions/literature along with all the possible adverse reactions/side effects for all the Medicines you take and that have been prescribed to you. Take any new Medicines after you have completely understood and accpet all the possible adverse reactions/side effects.   Do not drive when taking Pain medications or sleeping medications (Benzodaizepines)  Do not take more than prescribed Pain, Sleep and Anxiety Medications. It is not advisable to combine anxiety,sleep and pain medications without talking with your primary care practitioner  Special Instructions: If you have smoked or chewed Tobacco  in the last 2 yrs please stop smoking, stop any regular Alcohol  and or any Recreational drug use.  Wear Seat belts while driving.  Please note: You were cared for by a hospitalist during your hospital stay. Once you are discharged,  your primary care physician will handle any further medical issues. Please note that NO REFILLS for any discharge medications will be authorized once you are discharged, as it is imperative that you return to your primary care physician (or establish a relationship with a primary care physician if you do not have one) for your post hospital discharge needs so that they can reassess your need for medications and monitor your lab values.

## 2017-01-18 NOTE — Progress Notes (Signed)
Patient is to be discharged home and in stable condition. O2 at bedside for transport. Patient IV removed, WNL. Patient given discharge instructions and verbalized understanding. Patient escorted out by staff via wheelchair.  Quita Skye, RN

## 2017-01-18 NOTE — Discharge Summary (Signed)
Physician Discharge Summary  Harold Johnson UJW:[id number] DOB: 1935-03-19 DOA: 01/16/2017  PCP: Samuel Jester, DO  Admit date: 01/16/2017 Discharge date: 01/18/2017  Admitted From: Home  Disposition: Home   Recommendations for Outpatient Follow-up:  1. Follow up with PCP in 1 weeks  Discharge Condition: STABLE   CODE STATUS: FULL    Brief Hospitalization Summary: Please see all hospital notes, images, labs for full details of the hospitalization.  HPI: Harold Johnson is a 82 y.o. male presented to ED today with shortness of breath that worsened acutely this morning. He  has a history of chronic obstructive pulmonary disease and wears chronic oxygen. The paramedics reported that the patient did not wear oxygen the entire night as he was trying to conserve his tank as he was running low. The patient denies this and states that he was on oxygen all night long and just became more short of breath today.   The patient says that he just completed a course of antibiotics today for treating a pneumonia.  He denies chest pain fevers but has had some nonproductive coughing. There is no swelling of the legs. The symptoms became worsened throughout the morning and EMS apparently was called to his home multiple times. The paramedics reported that there was some type of syncopal episode, the patient does not recall this and there are no witnesses present with him in the ED.  He was noted to have renal insufficiency in ED but the acuity is unknown as the last lab values were from 2013 and the renal function for fairly normal at that time.  ED asked that patient be admitted for observation.    Brief Admission Hx: 82 year old gentleman with COPD and chronic oxygen dependence presented with acute COPD exacerbation.  MDM/Assessment & Plan:   1. COPD exacerbation - pt says that he is at his baseline and denies SOB or CP. No productive cough.  Discharge home today on steroid taper, continuous oxygen  supplementation. Resume home respiratory medications. He reports that he now has power at home and has all of his respiratory equipment.    2. Anemia of CKD - will follow.  3. AKI - suspected mild prerenal injury from dehydration, improved with gentle hydration.   4. Elevated troponin - suspect some demand ischemia - no active chest pain reported and no acute EKG change. Troponin trend down.   DVT Prophylaxis:lovenox Code Status:full  Family Communication:none present Disposition Plan:home when power restored. Pt says that he has power at home now.   Discharge Diagnoses:  Principal Problem:   COPD exacerbation (HCC) Active Problems:   Anemia   Chronic respiratory failure with hypoxia (HCC)   AKI (acute kidney injury) (HCC)   Supplemental oxygen dependent   Elevated troponin  Discharge Instructions: Discharge Instructions    Call MD for:  difficulty breathing, headache or visual disturbances    Complete by:  As directed    Call MD for:  extreme fatigue    Complete by:  As directed    Call MD for:  hives    Complete by:  As directed    Call MD for:  persistant dizziness or light-headedness    Complete by:  As directed    Call MD for:  temperature >100.4    Complete by:  As directed    Increase activity slowly    Complete by:  As directed      Allergies as of 01/18/2017   No Known Allergies     Medication List  STOP taking these medications   levofloxacin 750 MG tablet Commonly known as:  LEVAQUIN   nabumetone 500 MG tablet Commonly known as:  RELAFEN     TAKE these medications   amLODipine 10 MG tablet Commonly known as:  NORVASC Take 10 mg by mouth daily.   aspirin EC 81 MG tablet Take 81 mg by mouth daily.   cloNIDine 0.1 MG tablet Commonly known as:  CATAPRES Take 0.1 mg by mouth at bedtime.   escitalopram 10 MG tablet Commonly known as:  LEXAPRO Take 10 mg by mouth daily.   gabapentin 300 MG capsule Commonly known as:  NEURONTIN Take 1  capsule (300 mg total) by mouth at bedtime. What changed:  when to take this   guaiFENesin 600 MG 12 hr tablet Commonly known as:  MUCINEX Take 2 tablets (1,200 mg total) by mouth 2 (two) times daily.   losartan-hydrochlorothiazide 100-25 MG tablet Commonly known as:  HYZAAR TAKE 1 TABLET EVERY DAY   OXYGEN Inhale 2-3 L into the lungs daily.   predniSONE 20 MG tablet Commonly known as:  DELTASONE Take 3 PO QAM x3days, 2 PO QAM x3days, 1 PO QAM x5days   STIOLTO RESPIMAT 2.5-2.5 MCG/ACT Aers Generic drug:  Tiotropium Bromide-Olodaterol Inhale 2 puffs into the lungs daily.   VENTOLIN HFA 108 (90 Base) MCG/ACT inhaler Generic drug:  albuterol Inhale 2 puffs into the lungs every 4 (four) hours as needed for wheezing.      Follow-up Information    Samuel Jester, DO. Schedule an appointment as soon as possible for a visit in 1 week(s).   Why:  Hospital Follow Up  Contact information: [address] [place] [address] [phone]          No Known Allergies Current Discharge Medication List    START taking these medications   Details  guaiFENesin (MUCINEX) 600 MG 12 hr tablet Take 2 tablets (1,200 mg total) by mouth 2 (two) times daily. Qty: 20 tablet, Refills: 0    predniSONE (DELTASONE) 20 MG tablet Take 3 PO QAM x3days, 2 PO QAM x3days, 1 PO QAM x5days Qty: 20 tablet, Refills: 0      CONTINUE these medications which have NOT CHANGED   Details  amLODipine (NORVASC) 10 MG tablet Take 10 mg by mouth daily.     aspirin EC 81 MG tablet Take 81 mg by mouth daily.    cloNIDine (CATAPRES) 0.1 MG tablet Take 0.1 mg by mouth at bedtime.    escitalopram (LEXAPRO) 10 MG tablet Take 10 mg by mouth daily.    gabapentin (NEURONTIN) 300 MG capsule Take 1 capsule (300 mg total) by mouth at bedtime.    losartan-hydrochlorothiazide (HYZAAR) 100-25 MG tablet TAKE 1 TABLET EVERY DAY Qty: 90 tablet, Refills: 0    OXYGEN Inhale 2-3 L into the lungs daily.    Tiotropium  Bromide-Olodaterol (STIOLTO RESPIMAT) 2.5-2.5 MCG/ACT AERS Inhale 2 puffs into the lungs daily.    VENTOLIN HFA 108 (90 Base) MCG/ACT inhaler Inhale 2 puffs into the lungs every 4 (four) hours as needed for wheezing.       STOP taking these medications     levofloxacin (LEVAQUIN) 750 MG tablet      nabumetone (RELAFEN) 500 MG tablet         Procedures/Studies: Dg Chest 2 View  Result Date: 01/01/2017 CLINICAL DATA:  Sudden onset discordant of breath with wheezing. Productive cough and left-sided chest pain 2 weeks. Hypoxia. EXAM: CHEST  2  VIEW COMPARISON:  10/23/2011 FINDINGS: Lungs are adequately inflated with possible mild opacification over the anterior upper lobes on the lateral film which may be due to atelectasis or infection. No evidence of effusion. Cardiomediastinal silhouette and remainder of the exam is unchanged. IMPRESSION: Possible mild opacification over the anterior upper lungs on the lateral film which may be due to atelectasis or infection. Electronically Signed   By: Elberta Fortis M.D.   On: 01/01/2017 19:21   Dg Chest Port 1 View  Result Date: 01/16/2017 CLINICAL DATA:  Sob/low o2 sats/recent pneumonia/copd/htn/ex smoker EXAM: PORTABLE CHEST 1 VIEW COMPARISON:  01/01/2017 FINDINGS: Cardiac silhouette is mildly enlarged. No mediastinal or hilar masses or convincing adenopathy. Lungs are clear.  No pleural effusion or pneumothorax. Skeletal structures are grossly intact. IMPRESSION: 1. No acute cardiopulmonary disease. Electronically Signed   By: Amie Portland M.D.   On: 01/16/2017 10:20      Subjective: Pt says he feels fine and would like to go home.  He denies SOB or chest pain.   Discharge Exam: Vitals:   01/18/17 0546 01/18/17 0801  BP: (!) 149/81   Pulse: (!) 52   Resp: 18   Temp: (!) 97.5 F (36.4 C)   SpO2: 99% 100%   Vitals:   01/17/17 2016 01/17/17 2235 01/18/17 0546 01/18/17 0801  BP: (!) 180/85 (!) 168/79 (!) 149/81   Pulse: 92 87 (!) 52    Resp: Temp: 98.4 F (36.9 C)  (!) 97.5 F (36.4 C)   TempSrc: Oral  Oral   SpO2: 100% 100% 99% 100%  Weight:      Height:        General exam: Awake, alert, sitting up, no apparent distress, cooperative. Respiratory system: better air movement, rare bibasilar expiratory wheezes heard. No increased work of breathing. Cardiovascular system: S1 & S2 heard.  Gastrointestinal system: Abdomen is nondistended, soft and nontender. Normal bowel sounds heard. Central nervous system: Alert and oriented. No focal neurological deficits. Extremities: no CCE.   The results of significant diagnostics from this hospitalization (including imaging, microbiology, ancillary and laboratory) are listed below for reference.     Microbiology: No results found for this or any previous visit (from the past 240 hour(s)).   Labs: BNP (last 3 results)  Recent Labs  01/01/17 1836  BNP 95.0   Basic Metabolic Panel:  Recent Labs Lab 01/16/17 1032 01/17/17 0424 01/18/17 0605  NA 143 142 139  K 3.4* 4.3 4.4  CL 99* 99* 97*  CO2 35* 34* 37*  GLUCOSE 92 134* 129*  BUN 49* 45* 45*  CREATININE 2.08* 1.82* 1.59*  CALCIUM 10.1 9.8 10.0   Liver Function Tests:  Recent Labs Lab 01/16/17 1032 01/17/17 0424 01/18/17 0605  AST 44* 32 40  ALT 54 47 45  ALKPHOS 42 38 37*  BILITOT 1.0 0.9 0.6  PROT 6.4* 5.7* 5.5*  ALBUMIN 3.5 3.1* 2.9*   No results for input(s): LIPASE, AMYLASE in the last 168 hours. No results for input(s): AMMONIA in the last 168 hours. CBC:  Recent Labs Lab 01/16/17 1032 01/17/17 0424  WBC 9.6 6.2  NEUTROABS 6.5 5.7  HGB 12.2* 11.4*  HCT 38.9* 36.6*  MCV 92.8 93.8  PLT 140* 138*   Cardiac Enzymes:  Recent Labs Lab 01/16/17 1032 01/16/17 2111 01/17/17 0424  TROPONINI 0.09* 0.13* 0.09*   BNP: Invalid input(s): POCBNP CBG: No results for input(s): GLUCAP in the last 168 hours. D-Dimer No results for input(s):  DDIMER in the last 72 hours. Hgb  A1c No results for input(s): HGBA1C in the last 72 hours. Lipid Profile No results for input(s): CHOL, HDL, LDLCALC, TRIG, CHOLHDL, LDLDIRECT in the last 72 hours. Thyroid function studies No results for input(s): TSH, T4TOTAL, T3FREE, THYROIDAB in the last 72 hours.  Invalid input(s): FREET3 Anemia work up No results for input(s): VITAMINB12, FOLATE, FERRITIN, TIBC, IRON, RETICCTPCT in the last 72 hours. Urinalysis    Component Value Date/Time   COLORURINE YELLOW 10/12/2011 1019   APPEARANCEUR CLEAR 10/12/2011 1019   LABSPEC >1.030 (H) 10/12/2011 1019   PHURINE 6.0 10/12/2011 1019   GLUCOSEU NEGATIVE 10/12/2011 1019   HGBUR LARGE (A) 10/12/2011 1019   BILIRUBINUR NEGATIVE 10/12/2011 1019   KETONESUR NEGATIVE 10/12/2011 1019   PROTEINUR 100 (A) 10/12/2011 1019   UROBILINOGEN 0.2 10/12/2011 1019   NITRITE NEGATIVE 10/12/2011 1019   LEUKOCYTESUR NEGATIVE 10/12/2011 1019   Sepsis Labs Invalid input(s): PROCALCITONIN,  WBC,  LACTICIDVEN Microbiology No results found for this or any previous visit (from the past 240 hour(s)).  Time coordinating discharge:   SIGNED:  Standley Dakins, MD  [facility] 01/18/2017, 9:46 AM Pager [phone]  If 7PM-7AM, please contact night-coverage [email or web address] Password [email or web address]

## 2017-06-07 ENCOUNTER — Encounter (HOSPITAL_COMMUNITY): Payer: Self-pay | Admitting: Emergency Medicine

## 2017-06-07 ENCOUNTER — Emergency Department (HOSPITAL_COMMUNITY): Payer: Medicare HMO

## 2017-06-07 ENCOUNTER — Observation Stay (HOSPITAL_COMMUNITY)
Admission: EM | Admit: 2017-06-07 | Discharge: 2017-06-08 | Disposition: A | Discharge status: Home / Self Care | Payer: Medicare HMO | Attending: Internal Medicine | Admitting: Internal Medicine

## 2017-06-07 ENCOUNTER — Other Ambulatory Visit: Payer: Self-pay

## 2017-06-07 DIAGNOSIS — R0902 Hypoxemia: Secondary | ICD-10-CM

## 2017-06-07 DIAGNOSIS — I129 Hypertensive chronic kidney disease with stage 1 through stage 4 chronic kidney disease, or unspecified chronic kidney disease: Secondary | ICD-10-CM | POA: Insufficient documentation

## 2017-06-07 DIAGNOSIS — J9621 Acute and chronic respiratory failure with hypoxia: Secondary | ICD-10-CM | POA: Insufficient documentation

## 2017-06-07 DIAGNOSIS — J441 Chronic obstructive pulmonary disease with (acute) exacerbation: Principal | ICD-10-CM

## 2017-06-07 DIAGNOSIS — Z87891 Personal history of nicotine dependence: Secondary | ICD-10-CM | POA: Insufficient documentation

## 2017-06-07 DIAGNOSIS — Z79899 Other long term (current) drug therapy: Secondary | ICD-10-CM | POA: Insufficient documentation

## 2017-06-07 DIAGNOSIS — Z7982 Long term (current) use of aspirin: Secondary | ICD-10-CM | POA: Diagnosis not present

## 2017-06-07 DIAGNOSIS — R0602 Shortness of breath: Secondary | ICD-10-CM | POA: Diagnosis present

## 2017-06-07 DIAGNOSIS — N183 Chronic kidney disease, stage 3 unspecified: Secondary | ICD-10-CM | POA: Diagnosis present

## 2017-06-07 DIAGNOSIS — I10 Essential (primary) hypertension: Secondary | ICD-10-CM

## 2017-06-07 LAB — CBC WITH DIFFERENTIAL/PLATELET
Basophils Absolute: 0 10*3/uL (ref 0.0–0.1)
Basophils Relative: 0 %
EOS ABS: 0.1 10*3/uL (ref 0.0–0.7)
Eosinophils Relative: 3 %
HEMATOCRIT: 32.9 % — AB (ref 39.0–52.0)
Hemoglobin: 9.8 g/dL — ABNORMAL LOW (ref 13.0–17.0)
LYMPHS ABS: 1.1 10*3/uL (ref 0.7–4.0)
LYMPHS PCT: 22 %
MCH: 28.7 pg (ref 26.0–34.0)
MCHC: 29.8 g/dL — AB (ref 30.0–36.0)
MCV: 96.5 fL (ref 78.0–100.0)
MONOS PCT: 14 %
Monocytes Absolute: 0.7 10*3/uL (ref 0.1–1.0)
NEUTROS ABS: 3.2 10*3/uL (ref 1.7–7.7)
NEUTROS PCT: 61 %
Platelets: 175 10*3/uL (ref 150–400)
RBC: 3.41 MIL/uL — AB (ref 4.22–5.81)
RDW: 13.8 % (ref 11.5–15.5)
WBC: 5.2 10*3/uL (ref 4.0–10.5)

## 2017-06-07 LAB — BASIC METABOLIC PANEL
Anion gap: 9 (ref 5–15)
BUN: 25 mg/dL — ABNORMAL HIGH (ref 6–20)
CO2: 40 mmol/L — AB (ref 22–32)
CREATININE: 1.57 mg/dL — AB (ref 0.61–1.24)
Calcium: 11.4 mg/dL — ABNORMAL HIGH (ref 8.9–10.3)
Chloride: 96 mmol/L — ABNORMAL LOW (ref 101–111)
GFR calc non Af Amer: 40 mL/min — ABNORMAL LOW (ref >60)
GFR, EST AFRICAN AMERICAN: 46 mL/min — AB (ref >60)
Glucose, Bld: 99 mg/dL (ref 65–99)
POTASSIUM: 4 mmol/L (ref 3.5–5.1)
SODIUM: 145 mmol/L (ref 135–145)

## 2017-06-07 LAB — TROPONIN I: Troponin I: 0.05 ng/mL (ref <0.03)

## 2017-06-07 MED ORDER — ESCITALOPRAM OXALATE 10 MG PO TABS
10.0000 mg | ORAL_TABLET | Freq: Every day | ORAL | Status: DC
  Administered 2017-06-07 – 2017-06-08 (×2): 10 mg via ORAL
  Filled 2017-06-07 (×2): qty 1

## 2017-06-07 MED ORDER — ONDANSETRON HCL 4 MG PO TABS: 4.0000 mg | ORAL_TABLET | Freq: Four times a day (QID) | ORAL | Status: DC | PRN

## 2017-06-07 MED ORDER — ALBUTEROL SULFATE (2.5 MG/3ML) 0.083% IN NEBU
5.0000 mg | INHALATION_SOLUTION | Freq: Once | RESPIRATORY_TRACT | Status: AC
  Administered 2017-06-07: 5 mg via RESPIRATORY_TRACT
  Filled 2017-06-07: qty 6

## 2017-06-07 MED ORDER — METHYLPREDNISOLONE SODIUM SUCC 125 MG IJ SOLR: 60.0000 mg | Freq: Two times a day (BID) | INTRAMUSCULAR | Status: DC

## 2017-06-07 MED ORDER — GUAIFENESIN ER 600 MG PO TB12
1200.0000 mg | ORAL_TABLET | Freq: Two times a day (BID) | ORAL | Status: DC
  Administered 2017-06-07 – 2017-06-08 (×2): 1200 mg via ORAL
  Filled 2017-06-07 (×2): qty 2

## 2017-06-07 MED ORDER — CLONIDINE HCL 0.1 MG PO TABS
0.1000 mg | ORAL_TABLET | Freq: Every day | ORAL | Status: DC
  Administered 2017-06-07: 0.1 mg via ORAL
  Filled 2017-06-07: qty 1

## 2017-06-07 MED ORDER — POLYETHYLENE GLYCOL 3350 17 G PO PACK
17.0000 g | PACK | Freq: Every day | ORAL | Status: DC
  Administered 2017-06-07 – 2017-06-08 (×2): 17 g via ORAL
  Filled 2017-06-07 (×2): qty 1

## 2017-06-07 MED ORDER — IPRATROPIUM BROMIDE 0.02 % IN SOLN
0.5000 mg | Freq: Once | RESPIRATORY_TRACT | Status: AC
  Administered 2017-06-07: 0.5 mg via RESPIRATORY_TRACT
  Filled 2017-06-07: qty 2.5

## 2017-06-07 MED ORDER — ACETAMINOPHEN 650 MG RE SUPP: 650.0000 mg | Freq: Four times a day (QID) | RECTAL | Status: DC | PRN

## 2017-06-07 MED ORDER — IPRATROPIUM-ALBUTEROL 0.5-2.5 (3) MG/3ML IN SOLN: 3.0000 mL | Freq: Four times a day (QID) | RESPIRATORY_TRACT | Status: DC

## 2017-06-07 MED ORDER — DOXYCYCLINE HYCLATE 100 MG PO TABS
100.0000 mg | ORAL_TABLET | Freq: Two times a day (BID) | ORAL | Status: DC
  Administered 2017-06-07 – 2017-06-08 (×2): 100 mg via ORAL
  Filled 2017-06-07 (×2): qty 1

## 2017-06-07 MED ORDER — ASPIRIN EC 81 MG PO TBEC
81.0000 mg | DELAYED_RELEASE_TABLET | Freq: Every day | ORAL | Status: DC
  Administered 2017-06-07 – 2017-06-08 (×2): 81 mg via ORAL
  Filled 2017-06-07 (×2): qty 1

## 2017-06-07 MED ORDER — ALBUTEROL SULFATE (2.5 MG/3ML) 0.083% IN NEBU: 2.5000 mg | INHALATION_SOLUTION | RESPIRATORY_TRACT | Status: DC | PRN

## 2017-06-07 MED ORDER — METHYLPREDNISOLONE SODIUM SUCC 125 MG IJ SOLR
60.0000 mg | Freq: Two times a day (BID) | INTRAMUSCULAR | Status: DC
Start: 2017-06-07 — End: 2017-06-08
  Administered 2017-06-07 – 2017-06-08 (×2): 60 mg via INTRAVENOUS
  Filled 2017-06-07 (×2): qty 2

## 2017-06-07 MED ORDER — GABAPENTIN 300 MG PO CAPS
300.0000 mg | ORAL_CAPSULE | Freq: Three times a day (TID) | ORAL | Status: DC
Start: 2017-06-07 — End: 2017-06-08
  Administered 2017-06-07 – 2017-06-08 (×3): 300 mg via ORAL
  Filled 2017-06-07 (×3): qty 1

## 2017-06-07 MED ORDER — ACETAMINOPHEN 325 MG PO TABS
650.0000 mg | ORAL_TABLET | Freq: Four times a day (QID) | ORAL | Status: DC | PRN
  Administered 2017-06-08: 650 mg via ORAL
  Filled 2017-06-07: qty 2

## 2017-06-07 MED ORDER — ONDANSETRON HCL 4 MG/2ML IJ SOLN: 4.0000 mg | Freq: Four times a day (QID) | INTRAMUSCULAR | Status: DC | PRN

## 2017-06-07 MED ORDER — AMLODIPINE BESYLATE 5 MG PO TABS
10.0000 mg | ORAL_TABLET | Freq: Every day | ORAL | Status: DC
Start: 2017-06-07 — End: 2017-06-08
  Administered 2017-06-07 – 2017-06-08 (×2): 10 mg via ORAL
  Filled 2017-06-07 (×2): qty 2

## 2017-06-07 MED ORDER — ENOXAPARIN SODIUM 40 MG/0.4ML ~~LOC~~ SOLN
40.0000 mg | SUBCUTANEOUS | Status: DC
  Administered 2017-06-07: 40 mg via SUBCUTANEOUS
  Filled 2017-06-07: qty 0.4

## 2017-06-07 MED ORDER — SODIUM CHLORIDE 0.45 % IV SOLN
INTRAVENOUS | Status: DC
  Administered 2017-06-07 – 2017-06-08 (×2): via INTRAVENOUS

## 2017-06-07 MED ORDER — METHYLPREDNISOLONE SODIUM SUCC 125 MG IJ SOLR
125.0000 mg | Freq: Once | INTRAMUSCULAR | Status: AC
  Administered 2017-06-07: 125 mg via INTRAVENOUS
  Filled 2017-06-07: qty 2

## 2017-06-07 MED ORDER — IPRATROPIUM-ALBUTEROL 0.5-2.5 (3) MG/3ML IN SOLN
3.0000 mL | Freq: Four times a day (QID) | RESPIRATORY_TRACT | Status: DC
  Administered 2017-06-07 – 2017-06-08 (×2): 3 mL via RESPIRATORY_TRACT
  Filled 2017-06-07 (×2): qty 3

## 2017-06-07 MED ORDER — ALBUTEROL SULFATE (2.5 MG/3ML) 0.083% IN NEBU
5.0000 mg | INHALATION_SOLUTION | Freq: Once | RESPIRATORY_TRACT | Status: AC
Start: 2017-06-07 — End: 2017-06-07
  Administered 2017-06-07: 5 mg via RESPIRATORY_TRACT
  Filled 2017-06-07: qty 6

## 2017-06-07 NOTE — ED Notes (Signed)
RT at bedside.

## 2017-06-07 NOTE — Plan of Care (Signed)
gressing

## 2017-06-07 NOTE — ED Provider Notes (Signed)
[facility] EMERGENCY DEPARTMENT Provider Note   CSN: [id number] Arrival date & time: 06/07/17  1005     History   Chief Complaint Chief Complaint  Patient presents with  . Shortness of Breath    HPI Harold Johnson is a 83 y.o. male.  HPI Patient presents with shortness of breath.  History of COPD on chronic oxygen at home.  For the last couple days has been more short of breath.  Increased his oxygen from 2 L to 3 L.  Has had an occasional cough without real sputum production.  No fevers.  No swelling in his legs.  No chest pain.  Mild relief with his inhalers at home. Past Medical History:  Diagnosis Date  . Acute respiratory failure with hypercapnia (HCC) 10/2011   Status post intubation and ventilation.  . Anemia 10/12/2011  . Chronic back pain   . Chronic respiratory failure with hypoxia (HCC)   . Chronic respiratory failure with hypoxia (HCC)   . COPD (chronic obstructive pulmonary disease) (HCC)    Oxygen-dependent. C02 retainer  . Elevated CK 10/13/2011   Possibly statin-induced rhabdomyolysis.  . Hypercholesteremia   . Hypertension   . Pneumonia   . Right axis deviation   . Stroke [facility](HCC)     Patient Active Problem List   Diagnosis Date Noted  . COPD exacerbation (HCC) 01/16/2017  . AKI (acute kidney injury) (HCC) 01/16/2017  . Supplemental oxygen dependent 01/16/2017  . Elevated troponin 01/16/2017  . Hypernatremia 10/22/2011  . Debility 10/22/2011  . Dysphagia 10/21/2011  . Lethargy 10/21/2011  . Sinusitis 10/21/2011  . Acute renal failure (HCC) 10/15/2011  . Encephalopathy acute 10/14/2011  . Rhabdomyolysis 10/13/2011  . Hyperglycemia, drug-induced 10/13/2011  . COPD with acute exacerbation (HCC) 10/12/2011  . Tachycardia 10/12/2011  . Right axis deviation 10/12/2011  . Anemia 10/12/2011  . Chronic respiratory failure with hypoxia (HCC) 10/12/2011  . Acute respiratory failure with hypercapnia (HCC) 10/12/2011  . Chronic back pain 10/12/2011  . PNA  (pneumonia) 10/12/2011  . Hypertension 10/12/2011    Past Surgical History:  Procedure Laterality Date  . fatty tumor     Excision from left shoulder.  . INGUINAL HERNIA REPAIR         Home Medications    Prior to Admission medications   Medication Sig Start Date End Date Taking? Authorizing Provider  amLODipine (NORVASC) 10 MG tablet Take 10 mg by mouth daily.    Yes [provider]  aspirin EC 81 MG tablet Take 81 mg by mouth daily.   Yes [provider]  cloNIDine (CATAPRES) 0.1 MG tablet Take 0.1 mg by mouth at bedtime.   Yes [provider]  escitalopram (LEXAPRO) 10 MG tablet Take 10 mg by mouth daily.   Yes [provider]  gabapentin (NEURONTIN) 300 MG capsule Take 1 capsule (300 mg total) by mouth at bedtime. Patient taking differently: Take 300 mg by mouth 3 (three) times daily.  10/24/11  Yes Harold CousinFisher, Denise, MD  losartan-hydrochlorothiazide [facility](HYZAAR) 100-25 MG tablet TAKE 1 TABLET EVERY DAY 12/06/15  Yes Remus LofflerJones, Harold Johnson  nabumetone (RELAFEN) 500 MG tablet Take 1 tablet by mouth 2 (two) times daily. 05/26/17  Yes [provider]  OXYGEN Inhale 2-3 L into the lungs daily.   Yes [provider]  VENTOLIN HFA 108 (90 Base) MCG/ACT inhaler Inhale 2 puffs into the lungs every 4 (four) hours as needed for wheezing.  12/26/16  Yes [provider]  predniSONE (DELTASONE) 20  MG tablet Take 3 PO QAM x3days, 2 PO QAM x3days, 1 PO QAM x5days Patient not taking: Reported on 06/07/2017 01/18/17   Harold Fleet, MD  Tiotropium Bromide-Olodaterol (STIOLTO RESPIMAT) 2.5-2.5 MCG/ACT AERS Inhale 2 puffs into the lungs daily.    [provider]    Family History No family history on file.  Social History Social History   Tobacco Use  . Smoking status: Former Games developer  . Smokeless tobacco: Never Used  Substance Use Topics  . Alcohol use: No    Comment: used to  . Drug use: No     Allergies   Patient has  no known allergies.   Review of Systems Review of Systems  Constitutional: Negative for appetite change.  HENT: Negative for congestion.   Respiratory: Positive for cough and shortness of breath. Negative for stridor.   Cardiovascular: Negative for chest pain.  Gastrointestinal: Negative for abdominal pain.  Genitourinary: Negative for flank pain.  Musculoskeletal: Negative for back pain.  Neurological: Negative for weakness.  Psychiatric/Behavioral: Negative for confusion.     Physical Exam Updated Vital Signs BP (!) 174/88   Pulse 78   Temp 98.5 F (36.9 C) (Oral)   Resp (!) 22   Ht 5\' 9"  (1.753 m)   Wt 91.6 kg (202 lb)   SpO2 98%   BMI 29.83 kg/m   Physical Exam  Constitutional: He appears well-developed.  HENT:  Head: Atraumatic.  Neck: Neck supple.  Cardiovascular: Regular rhythm.  Pulmonary/Chest: Effort normal. He has decreased breath sounds. He has no rhonchi. He has no rales.  Abdominal: Soft. There is no tenderness.  Musculoskeletal:       Right lower leg: He exhibits edema.       Left lower leg: He exhibits edema.  Mild edema bilateral lower extremities  Neurological: He is alert.  Skin: Skin is warm. Capillary refill takes less than 2 seconds.     ED Treatments / Results  Labs (all labs ordered are listed, but only abnormal results are displayed) Labs Reviewed  TROPONIN I - Abnormal; Notable for the following components:      Result Value   Troponin I 0.05 (*)    All other components within normal limits  BASIC METABOLIC PANEL - Abnormal; Notable for the following components:   Chloride 96 (*)    CO2 40 (*)    BUN 25 (*)    Creatinine, Ser 1.57 (*)    Calcium 11.4 (*)    GFR calc non Af Amer 40 (*)    GFR calc Af Amer 46 (*)    All other components within normal limits  CBC WITH DIFFERENTIAL/PLATELET - Abnormal; Notable for the following components:   RBC 3.41 (*)    Hemoglobin 9.8 (*)    HCT 32.9 (*)    MCHC 29.8 (*)    All other  components within normal limits    EKG  EKG Interpretation  Date/Time:  Sunday June 07 2017 10:35:54 EST Ventricular Rate:  70 PR Interval:    QRS Duration: 106 QT Interval:  424 QTC Calculation: 458 R Axis:   99 Text Interpretation:  Sinus rhythm Atrial premature complexes Right axis deviation Borderline ST elevation, lateral leads Baseline wander in lead(s) V3 V5 V6 Confirmed by Benjiman Core [phone]) on 06/07/2017 11:56:26 AM       Radiology Dg Chest 2 View  Result Date: 06/07/2017 CLINICAL DATA:  Shortness of breath EXAM: CHEST  2 VIEW COMPARISON:  01/16/2017 FINDINGS: Lungs are essentially  clear. No focal consolidation. No pleural effusion or pneumothorax. The heart is top-normal in size. Visualized osseous structures are within normal limits. IMPRESSION: No evidence of acute cardiopulmonary disease. Electronically Signed   By: Charline Bills M.D.   On: 06/07/2017 11:27    Procedures Procedures (including critical care time)  Medications Ordered in ED Medications  albuterol (PROVENTIL) (2.5 MG/3ML) 0.083% nebulizer solution 5 mg (5 mg Nebulization Given 06/07/17 1102)  ipratropium (ATROVENT) nebulizer solution 0.5 mg (0.5 mg Nebulization Given 06/07/17 1102)  methylPREDNISolone sodium succinate (SOLU-MEDROL) 125 mg/2 mL injection 125 mg (125 mg Intravenous Given 06/07/17 1241)  albuterol (PROVENTIL) (2.5 MG/3ML) 0.083% nebulizer solution 5 mg (5 mg Nebulization Given 06/07/17 1245)     Initial Impression / Assessment and Plan / ED Course  I have reviewed the triage vital signs and the nursing notes.  Pertinent labs & imaging results that were available during my care of the patient were reviewed by me and considered in my medical decision making (see chart for details).     Patient presents with shortness of breath.  Has chronic COPD but has been worse today.  X-ray reassuring.  Troponin is mildly elevated but this appears to be close to his baseline.  However with  ambulation on his oxygen sats went down to the low 80s.  I cannot find documentation that this is his baseline.  Will admit to hospitalist for further treatment.  Final Clinical Impressions(s) / ED Diagnoses   Final diagnoses:  COPD exacerbation [facility])  Hypoxia    ED Discharge Orders    None       Benjiman Core, MD 06/07/17 1406

## 2017-06-07 NOTE — H&P (Signed)
History and Physical    Harold Johnson ZOX:[id number] DOB: September 12, 1934 DOA: 06/07/2017  PCP: Samuel Jester, DO  Patient coming from: home  I have personally briefly reviewed patient's old medical records in [facility] Link  Chief Complaint: shortness of breath  HPI: Harold Johnson is a 83 y.o. male with medical history significant of chronic respiratory failure on 3 L of oxygen, presents to the hospital with complaints of progressive shortness of breath.  Patient reports that he had worsening dyspnea on exertion for the past 4-5 days.  He is also had a productive cough.  Patient has been wheezing.  He did not use his nebulizer treatment at home.  He is unsure whether he has had any fevers.  No vomiting.  He does feel constipated.  He was evaluated in the emergency room where he was noted to be hypoxic upon ambulation despite wearing oxygen.  Chest x-ray did not show any evidence of pneumonia.  Other labs were relatively unrevealing.  It was felt that he likely has a COPD exacerbation and received steroids and bronchodilators.  When he was having difficulty ambulating due to shortness of breath, he was referred for admission.  Review of Systems: As per HPI otherwise 10 point review of systems negative.    Past Medical History:  Diagnosis Date  . Acute respiratory failure with hypercapnia (HCC) 10/2011   Status post intubation and ventilation.  . Anemia 10/12/2011  . Chronic back pain   . Chronic respiratory failure with hypoxia (HCC)   . Chronic respiratory failure with hypoxia (HCC)   . COPD (chronic obstructive pulmonary disease) (HCC)    Oxygen-dependent. C02 retainer  . Elevated CK 10/13/2011   Possibly statin-induced rhabdomyolysis.  . Hypercholesteremia   . Hypertension   . Pneumonia   . Right axis deviation   . Stroke [facility])     Past Surgical History:  Procedure Laterality Date  . fatty tumor     Excision from left shoulder.  . INGUINAL HERNIA REPAIR       reports that he has  quit smoking. he has never used smokeless tobacco. He reports that he does not drink alcohol or use drugs.  No Known Allergies  Family history: Family history reviewed and not pertinent  Prior to Admission medications   Medication Sig Start Date End Date Taking? Authorizing Provider  amLODipine (NORVASC) 10 MG tablet Take 10 mg by mouth daily.    Yes [provider]  aspirin EC 81 MG tablet Take 81 mg by mouth daily.   Yes [provider]  cloNIDine (CATAPRES) 0.1 MG tablet Take 0.1 mg by mouth at bedtime.   Yes [provider]  escitalopram (LEXAPRO) 10 MG tablet Take 10 mg by mouth daily.   Yes [provider]  gabapentin (NEURONTIN) 300 MG capsule Take 1 capsule (300 mg total) by mouth at bedtime. Patient taking differently: Take 300 mg by mouth 3 (three) times daily.  10/24/11  Yes Elliot Cousin, MD  losartan-hydrochlorothiazide [facility]) 100-25 MG tablet TAKE 1 TABLET EVERY DAY 12/06/15  Yes Remus Loffler, PA-C  nabumetone (RELAFEN) 500 MG tablet Take 1 tablet by mouth 2 (two) times daily. 05/26/17  Yes [provider]  OXYGEN Inhale 2-3 L into the lungs daily.   Yes [provider]  VENTOLIN HFA 108 (90 Base) MCG/ACT inhaler Inhale 2 puffs into the lungs every 4 (four) hours as needed for wheezing.  12/26/16  Yes [provider]  predniSONE (DELTASONE) 20 MG tablet Take  3 PO QAM x3days, 2 PO QAM x3days, 1 PO QAM x5days Patient not taking: Reported on 06/07/2017 01/18/17   Cleora Fleet, MD  Tiotropium Bromide-Olodaterol (STIOLTO RESPIMAT) 2.5-2.5 MCG/ACT AERS Inhale 2 puffs into the lungs daily.    [provider]    Physical Exam: Vitals:   06/07/17 1330 06/07/17 1415 06/07/17 1430 06/07/17 1510  BP: (!) 183/79  (!) 179/86 (!) 180/86  Pulse: 84 88 83 (!) 59  Resp:    18  Temp:    98.7 F (37.1 C)  TempSrc:    Oral  SpO2: 100% 100% 98% 96%  Weight:    90 kg (198 lb 6.4 oz)  Height:    5\' 9"  (1.753 m)     Constitutional: NAD, calm, comfortable Vitals:   06/07/17 1330 06/07/17 1415 06/07/17 1430 06/07/17 1510  BP: (!) 183/79  (!) 179/86 (!) 180/86  Pulse: 84 88 83 (!) 59  Resp:    18  Temp:    98.7 F (37.1 C)  TempSrc:    Oral  SpO2: 100% 100% 98% 96%  Weight:    90 kg (198 lb 6.4 oz)  Height:    5\' 9"  (1.753 m)   Eyes: PERRL, lids and conjunctivae normal ENMT: Mucous membranes are moist. Posterior pharynx clear of any exudate or lesions.Normal dentition.  Neck: normal, supple, no masses, no thyromegaly Respiratory: clear to auscultation bilaterally, no wheezing, no crackles. Normal respiratory effort. No accessory muscle use.  Cardiovascular: Regular rate and rhythm, no murmurs / rubs / gallops. No extremity edema. 2+ pedal pulses. No carotid bruits.  Abdomen: no tenderness, no masses palpated. No hepatosplenomegaly. Bowel sounds positive.  Musculoskeletal: no clubbing / cyanosis. No joint deformity upper and lower extremities. Good ROM, no contractures. Normal muscle tone.  Skin: no rashes, lesions, ulcers. No induration Neurologic: CN 2-12 grossly intact. Sensation intact, DTR normal. Strength 5/5 in all 4.  Psychiatric: Normal judgment and insight. Alert and oriented x 3. Normal mood.   (Anything < 9 systems with 2 bullets each down codes to level 1) (If patient refuses exam can't bill higher level) (Make sure to document decubitus ulcers present on admission -- if possible -- and whether patient has chronic indwelling catheter at time of admission)  Labs on Admission: I have personally reviewed following labs and imaging studies  CBC: Recent Labs  Lab 06/07/17 1045  WBC 5.2  NEUTROABS 3.2  HGB 9.8*  HCT 32.9*  MCV 96.5  PLT 175   Basic Metabolic Panel: Recent Labs  Lab 06/07/17 1045  NA 145  K 4.0  CL 96*  CO2 40*  GLUCOSE 99  BUN 25*  CREATININE 1.57*  CALCIUM 11.4*   GFR: Estimated Creatinine Clearance: 41.6 mL/min (A) (by C-G formula based on SCr of  1.57 mg/dL (H)). Liver Function Tests: No results for input(s): AST, ALT, ALKPHOS, BILITOT, PROT, ALBUMIN in the last 168 hours. No results for input(s): LIPASE, AMYLASE in the last 168 hours. No results for input(s): AMMONIA in the last 168 hours. Coagulation Profile: No results for input(s): INR, PROTIME in the last 168 hours. Cardiac Enzymes: Recent Labs  Lab 06/07/17 1045  TROPONINI 0.05*   BNP (last 3 results) No results for input(s): PROBNP in the last 8760 hours. HbA1C: No results for input(s): HGBA1C in the last 72 hours. CBG: No results for input(s): GLUCAP in the last 168 hours. Lipid Profile: No results for input(s): CHOL, HDL, LDLCALC, TRIG, CHOLHDL, LDLDIRECT in the last 72 hours.  Thyroid Function Tests: No results for input(s): TSH, T4TOTAL, FREET4, T3FREE, THYROIDAB in the last 72 hours. Anemia Panel: No results for input(s): VITAMINB12, FOLATE, FERRITIN, TIBC, IRON, RETICCTPCT in the last 72 hours. Urine analysis:    Component Value Date/Time   COLORURINE YELLOW 10/12/2011 1019   APPEARANCEUR CLEAR 10/12/2011 1019   LABSPEC >1.030 (H) 10/12/2011 1019   PHURINE 6.0 10/12/2011 1019   GLUCOSEU NEGATIVE 10/12/2011 1019   HGBUR LARGE (A) 10/12/2011 1019   BILIRUBINUR NEGATIVE 10/12/2011 1019   KETONESUR NEGATIVE 10/12/2011 1019   PROTEINUR 100 (A) 10/12/2011 1019   UROBILINOGEN 0.2 10/12/2011 1019   NITRITE NEGATIVE 10/12/2011 1019   LEUKOCYTESUR NEGATIVE 10/12/2011 1019    Radiological Exams on Admission: Dg Chest 2 View  Result Date: 06/07/2017 CLINICAL DATA:  Shortness of breath EXAM: CHEST  2 VIEW COMPARISON:  01/16/2017 FINDINGS: Lungs are essentially clear. No focal consolidation. No pleural effusion or pneumothorax. The heart is top-normal in size. Visualized osseous structures are within normal limits. IMPRESSION: No evidence of acute cardiopulmonary disease. Electronically Signed   By: Charline BillsSriyesh  Krishnan M.D.   On: 06/07/2017 11:27    EKG:  Independently reviewed. No acute changes  Assessment/Plan Active Problems:   Hypertension   COPD exacerbation (HCC)   Acute on chronic respiratory failure with hypoxia (HCC)   CKD (chronic kidney disease) stage 3, GFR 30-59 ml/min (HCC)    1. Acute on chronic respiratory failure.  Patient was hypoxic on ambulation despite wearing supplemental oxygen.  He also became dyspneic.  Continue to monitor and wean down oxygen back to baseline as tolerated. 2. COPD exacerbation.  Started on intravenous steroids, bronchodilators and oral doxycycline.  Continue pulmonary hygiene. 3. Hypertension.  Continue on amlodipine, clonidine.  Losartan/hydrochlorothiazide currently on hold due to renal insufficiency. 4. Chronic kidney disease stage III.  Creatinine currently appears near baseline.  Continue to follow.  He is chronically on NSAIDs which will be held  DVT prophylaxis: lovenox Code Status: full code Family Communication: no family present Disposition Plan: discharge home when improved Consults called:  Admission status: observation, medsug   Erick BlinksJehanzeb Myya Meenach MD [facility] Pager [phone]  If 7PM-7AM, please contact night-coverage [email or web address] Password [email or web address]  06/07/2017, 4:38 PM

## 2017-06-07 NOTE — ED Notes (Signed)
RT notified for breathing tx. 

## 2017-06-07 NOTE — ED Notes (Signed)
Attempted IV access x2. Will get another RN to attempt.  

## 2017-06-07 NOTE — ED Notes (Signed)
CRITICAL VALUE ALERT  Critical Value:  Troponin 0.05  Date & Time Notied:  06/07/17 1139   Provider Notified: Dr. Rubin PayorPickering  Orders Received/Actions taken: EPD notified, no further orders given at this time.

## 2017-06-07 NOTE — ED Notes (Signed)
Pt ambulated from room back and forth on 3l o2 via [place].  Pt became increasingly sob, sats between 84-87 %.  EDP notified

## 2017-06-07 NOTE — ED Triage Notes (Signed)
Per EMS, pt called out for SOB. Pt hx of COPD and wears 2L nasal cannula. Pt increased to 3L. States when he gets up and walks he gets really SOB. EMS states that pt had approx 50 ft concentrator line. Sats 99% upon EMS arrival. AOx4.

## 2017-06-08 DIAGNOSIS — J9621 Acute and chronic respiratory failure with hypoxia: Secondary | ICD-10-CM | POA: Diagnosis not present

## 2017-06-08 DIAGNOSIS — J441 Chronic obstructive pulmonary disease with (acute) exacerbation: Secondary | ICD-10-CM | POA: Diagnosis not present

## 2017-06-08 DIAGNOSIS — N183 Chronic kidney disease, stage 3 (moderate): Secondary | ICD-10-CM | POA: Diagnosis not present

## 2017-06-08 DIAGNOSIS — I10 Essential (primary) hypertension: Secondary | ICD-10-CM | POA: Diagnosis not present

## 2017-06-08 LAB — BASIC METABOLIC PANEL
ANION GAP: 11 (ref 5–15)
BUN: 27 mg/dL — ABNORMAL HIGH (ref 6–20)
CALCIUM: 10.8 mg/dL — AB (ref 8.9–10.3)
CO2: 35 mmol/L — ABNORMAL HIGH (ref 22–32)
Chloride: 92 mmol/L — ABNORMAL LOW (ref 101–111)
Creatinine, Ser: 1.47 mg/dL — ABNORMAL HIGH (ref 0.61–1.24)
GFR calc Af Amer: 50 mL/min — ABNORMAL LOW (ref >60)
GFR calc non Af Amer: 43 mL/min — ABNORMAL LOW (ref >60)
GLUCOSE: 140 mg/dL — AB (ref 65–99)
Potassium: 4.4 mmol/L (ref 3.5–5.1)
Sodium: 138 mmol/L (ref 135–145)

## 2017-06-08 MED ORDER — GUAIFENESIN ER 600 MG PO TB12: 600.0000 mg | ORAL_TABLET | Freq: Two times a day (BID) | ORAL | 0 refills | Status: AC

## 2017-06-08 MED ORDER — DOXYCYCLINE HYCLATE 100 MG PO TABS: 100.0000 mg | ORAL_TABLET | Freq: Two times a day (BID) | ORAL | 0 refills | Status: DC

## 2017-06-08 MED ORDER — IPRATROPIUM-ALBUTEROL 0.5-2.5 (3) MG/3ML IN SOLN
3.0000 mL | Freq: Three times a day (TID) | RESPIRATORY_TRACT | Status: DC
  Administered 2017-06-08: 3 mL via RESPIRATORY_TRACT
  Filled 2017-06-08: qty 3

## 2017-06-08 MED ORDER — POLYETHYLENE GLYCOL 3350 17 G PO PACK: 17.0000 g | PACK | Freq: Every day | ORAL | 0 refills | Status: AC | PRN

## 2017-06-08 MED ORDER — PREDNISONE 10 MG PO TABS: ORAL_TABLET | ORAL | 0 refills | Status: DC

## 2017-06-08 MED ORDER — METOPROLOL TARTRATE 25 MG PO TABS: 25.0000 mg | ORAL_TABLET | Freq: Two times a day (BID) | ORAL | 11 refills | Status: DC

## 2017-06-08 MED ORDER — ALBUTEROL SULFATE (2.5 MG/3ML) 0.083% IN NEBU: 2.5000 mg | INHALATION_SOLUTION | Freq: Four times a day (QID) | RESPIRATORY_TRACT | 12 refills | Status: DC | PRN

## 2017-06-08 NOTE — Progress Notes (Signed)
Pt IV removed, WNL.D/C instructions given to pt. Verbalized understanding. Pt awaiting wife to pick up for transport and portable oxygen.

## 2017-06-08 NOTE — Discharge Summary (Addendum)
Physician Discharge Summary  Nayef College ZOX:[id number] DOB: 1934-05-20 DOA: 06/07/2017  PCP: Samuel Jester, DO  Admit date: 06/07/2017 Discharge date: 06/08/2017  Admitted From: Home Disposition: Home  Recommendations for Outpatient Follow-up:  1. Follow up with PCP in 1-2 weeks 2. Please obtain BMP/CBC in one week 3. Losartan/hydrochlorothiazide discontinued due to renal dysfunction and hypercalcemia. 4. NSAIDs discontinued due to chronic kidney disease.  Home Health: Home health RN Equipment/Devices:  Discharge Condition: Stable CODE STATUS: Full code Diet recommendation: Heart Healthy   Brief/Interim Summary: 83 year old male with a history of chronic respiratory failure on 3 L of oxygen, COPD, presented to the hospital with worsening shortness of breath.  He did not use his nebulizer at home.  He had worsening wheezing and shortness of breath.  Chest x-ray did not show any pneumonia.  He was found to have COPD exacerbation and admitted for further treatments.  Patient received bronchodilators and intravenous steroids.  He was started on a course of doxycycline.  Since admission, his respiratory status has improved.  He is breathing comfortably on his baseline oxygen requirement.  Patient will be transitioned to prednisone taper, complete a course of doxycycline and will continue nebulizer treatments at home.  Home health RN has been set up to help educate the patient on how to manage his symptoms at home.  He was noted to have elevated creatinine and evidence of chronic kidney disease stage III.  ARB has been discontinued.  He was also noted to be hypercalcemic.  Hydrochlorothiazide is also been discontinued.  He has been started on metoprolol for blood pressure control.  He is continued on amlodipine.  He is otherwise stable for discharge  Discharge Diagnoses:  Active Problems:   Hypertension   COPD exacerbation (HCC)   Acute on chronic respiratory failure with hypoxia (HCC)   CKD  (chronic kidney disease) stage 3, GFR 30-59 ml/min [facility])    Discharge Instructions  Discharge Instructions    Diet - low sodium heart healthy   Complete by:  As directed    Increase activity slowly   Complete by:  As directed      Allergies as of 06/08/2017   No Known Allergies     Medication List    STOP taking these medications   losartan-hydrochlorothiazide 100-25 MG tablet Commonly known as:  HYZAAR   nabumetone 500 MG tablet Commonly known as:  RELAFEN     TAKE these medications   amLODipine 10 MG tablet Commonly known as:  NORVASC Take 10 mg by mouth daily.   aspirin EC 81 MG tablet Take 81 mg by mouth daily.   cloNIDine 0.1 MG tablet Commonly known as:  CATAPRES Take 0.1 mg by mouth at bedtime.   doxycycline 100 MG tablet Commonly known as:  VIBRA-TABS Take 1 tablet (100 mg total) by mouth every 12 (twelve) hours.   escitalopram 10 MG tablet Commonly known as:  LEXAPRO Take 10 mg by mouth daily.   gabapentin 300 MG capsule Commonly known as:  NEURONTIN Take 1 capsule (300 mg total) by mouth at bedtime. What changed:  when to take this   guaiFENesin 600 MG 12 hr tablet Commonly known as:  MUCINEX Take 1 tablet (600 mg total) by mouth 2 (two) times daily.   metoprolol tartrate 25 MG tablet Commonly known as:  LOPRESSOR Take 1 tablet (25 mg total) by mouth 2 (two) times daily.   OXYGEN Inhale 2-3 L into the lungs daily.   polyethylene glycol packet Commonly known as:  MIRALAX / GLYCOLAX Take 17 g by mouth daily as needed for mild constipation.   predniSONE 10 MG tablet Commonly known as:  DELTASONE Take 40mg  po daily for 2 days then 30mg  daily for 2 days then 20mg  daily for 2 days then 10mg  daily for 2 days then stop What changed:    medication strength  additional instructions   STIOLTO RESPIMAT 2.5-2.5 MCG/ACT Aers Generic drug:  Tiotropium Bromide-Olodaterol Inhale 2 puffs into the lungs daily.   VENTOLIN HFA 108 (90 Base) MCG/ACT  inhaler Generic drug:  albuterol Inhale 2 puffs into the lungs every 4 (four) hours as needed for wheezing. What changed:  Another medication with the same name was added. Make sure you understand how and when to take each.   albuterol (2.5 MG/3ML) 0.083% nebulizer solution Commonly known as:  PROVENTIL Take 3 mLs (2.5 mg total) by nebulization every 6 (six) hours as needed for wheezing or shortness of breath. What changed:  You were already taking a medication with the same name, and this prescription was added. Make sure you understand how and when to take each.       No Known Allergies  Consultations:     Procedures/Studies: Dg Chest 2 View  Result Date: 06/07/2017 CLINICAL DATA:  Shortness of breath EXAM: CHEST  2 VIEW COMPARISON:  01/16/2017 FINDINGS: Lungs are essentially clear. No focal consolidation. No pleural effusion or pneumothorax. The heart is top-normal in size. Visualized osseous structures are within normal limits. IMPRESSION: No evidence of acute cardiopulmonary disease. Electronically Signed   By: Charline BillsSriyesh  Krishnan M.D.   On: 06/07/2017 11:27       Subjective: Feeling better today.  Shortness of breath is better.  Wheezing resolved.  Discharge Exam: Vitals:   06/08/17 0500 06/08/17 0724  BP: (!) 161/76   Pulse: 70   Resp: 18   Temp: 98.2 F (36.8 C)   SpO2: 99% 100%   Vitals:   06/07/17 2129 06/08/17 0114 06/08/17 0500 06/08/17 0724  BP: (!) 168/75  (!) 161/76   Pulse: 82  70   Resp: 18  18   Temp: 98.4 F (36.9 C)  98.2 F (36.8 C)   TempSrc: Oral  Oral   SpO2: 98% 98% 99% 100%  Weight:      Height:        General: Pt is alert, awake, not in acute distress Cardiovascular: RRR, S1/S2 +, no rubs, no gallops Respiratory: CTA bilaterally, no wheezing, no rhonchi Abdominal: Soft, NT, ND, bowel sounds + Extremities: no edema, no cyanosis    The results of significant diagnostics from this hospitalization (including imaging, microbiology,  ancillary and laboratory) are listed below for reference.     Microbiology: No results found for this or any previous visit (from the past 240 hour(s)).   Labs: BNP (last 3 results) Recent Labs    01/01/17 1836  BNP 95.0   Basic Metabolic Panel: Recent Labs  Lab 06/07/17 1045 06/08/17 0624  NA 145 138  K 4.0 4.4  CL 96* 92*  CO2 40* 35*  GLUCOSE 99 140*  BUN 25* 27*  CREATININE 1.57* 1.47*  CALCIUM 11.4* 10.8*   Liver Function Tests: No results for input(s): AST, ALT, ALKPHOS, BILITOT, PROT, ALBUMIN in the last 168 hours. No results for input(s): LIPASE, AMYLASE in the last 168 hours. No results for input(s): AMMONIA in the last 168 hours. CBC: Recent Labs  Lab 06/07/17 1045  WBC 5.2  NEUTROABS 3.2  HGB 9.8*  HCT  32.9*  MCV 96.5  PLT 175   Cardiac Enzymes: Recent Labs  Lab 06/07/17 1045  TROPONINI 0.05*   BNP: Invalid input(s): POCBNP CBG: No results for input(s): GLUCAP in the last 168 hours. D-Dimer No results for input(s): DDIMER in the last 72 hours. Hgb A1c No results for input(s): HGBA1C in the last 72 hours. Lipid Profile No results for input(s): CHOL, HDL, LDLCALC, TRIG, CHOLHDL, LDLDIRECT in the last 72 hours. Thyroid function studies No results for input(s): TSH, T4TOTAL, T3FREE, THYROIDAB in the last 72 hours.  Invalid input(s): FREET3 Anemia work up No results for input(s): VITAMINB12, FOLATE, FERRITIN, TIBC, IRON, RETICCTPCT in the last 72 hours. Urinalysis    Component Value Date/Time   COLORURINE YELLOW 10/12/2011 1019   APPEARANCEUR CLEAR 10/12/2011 1019   LABSPEC >1.030 (H) 10/12/2011 1019   PHURINE 6.0 10/12/2011 1019   GLUCOSEU NEGATIVE 10/12/2011 1019   HGBUR LARGE (A) 10/12/2011 1019   BILIRUBINUR NEGATIVE 10/12/2011 1019   KETONESUR NEGATIVE 10/12/2011 1019   PROTEINUR 100 (A) 10/12/2011 1019   UROBILINOGEN 0.2 10/12/2011 1019   NITRITE NEGATIVE 10/12/2011 1019   LEUKOCYTESUR NEGATIVE 10/12/2011 1019   Sepsis  Labs Invalid input(s): PROCALCITONIN,  WBC,  LACTICIDVEN Microbiology No results found for this or any previous visit (from the past 240 hour(s)).   Time coordinating discharge: Over 30 minutes  SIGNED:   Erick Blinks, MD  [facility] 06/08/2017, 10:17 AM Pager   If 7PM-7AM, please contact night-coverage [email or web address] Password [email or web address]

## 2017-06-08 NOTE — Care Management Note (Signed)
Case Management Note  Patient Details  Name: Harold CollaJohn Johnson MRN: [id number] Date of Birth: Jun 05, 1934    Expected Discharge Date:  06/08/17               Expected Discharge Plan:  Home/Self Care  In-House Referral:     Discharge planning Services  CM Consult  Post Acute Care Choice:  Home Health Choice offered to:  Patient  DME Arranged:    DME Agency:     HH Arranged:  Patient Refused HH HH Agency:     Status of Service:  Completed, signed off  If discussed at [employer] Length [employer] Stay Meetings, dates discussed:    Additional Comments: Patient discharging home today. From home with wife. Ind with ADL's. Has home oxygen. No Home health or DME pta. Still drives. [place] Offered HH RN to patient, he declines.  Brother in law to bring portable tank for transport home today.   Rachelann Enloe, Chrystine OilerSharley Diane, RN 06/08/2017, 10:43 AM

## 2017-06-08 NOTE — Care Management Obs Status (Signed)
MEDICARE OBSERVATION STATUS NOTIFICATION   Patient Details  Name: Harold Johnson MRN: [id number] Date of Birth: Dec 25, 1934   Medicare Observation Status Notification Given:  Yes    Misbah Hornaday, Chrystine OilerSharley Diane, RN 06/08/2017, 10:46 AM

## 2017-06-16 ENCOUNTER — Inpatient Hospital Stay (HOSPITAL_COMMUNITY)
Admission: EM | Admit: 2017-06-16 | Discharge: 2017-06-18 | DRG: 291 | Disposition: A | Discharge status: Home / Self Care | Payer: Medicare HMO | Attending: Internal Medicine | Admitting: Internal Medicine

## 2017-06-16 ENCOUNTER — Emergency Department (HOSPITAL_COMMUNITY): Payer: Medicare HMO

## 2017-06-16 ENCOUNTER — Encounter (HOSPITAL_COMMUNITY): Payer: Self-pay

## 2017-06-16 ENCOUNTER — Other Ambulatory Visit: Payer: Self-pay

## 2017-06-16 DIAGNOSIS — I493 Ventricular premature depolarization: Secondary | ICD-10-CM | POA: Diagnosis present

## 2017-06-16 DIAGNOSIS — R739 Hyperglycemia, unspecified: Secondary | ICD-10-CM | POA: Diagnosis present

## 2017-06-16 DIAGNOSIS — N183 Chronic kidney disease, stage 3 unspecified: Secondary | ICD-10-CM | POA: Diagnosis present

## 2017-06-16 DIAGNOSIS — Z8249 Family history of ischemic heart disease and other diseases of the circulatory system: Secondary | ICD-10-CM

## 2017-06-16 DIAGNOSIS — I2781 Cor pulmonale (chronic): Secondary | ICD-10-CM | POA: Diagnosis present

## 2017-06-16 DIAGNOSIS — T50905A Adverse effect of unspecified drugs, medicaments and biological substances, initial encounter: Secondary | ICD-10-CM | POA: Diagnosis present

## 2017-06-16 DIAGNOSIS — I509 Heart failure, unspecified: Secondary | ICD-10-CM | POA: Diagnosis not present

## 2017-06-16 DIAGNOSIS — R740 Nonspecific elevation of levels of transaminase and lactic acid dehydrogenase [LDH]: Secondary | ICD-10-CM | POA: Diagnosis not present

## 2017-06-16 DIAGNOSIS — Z9981 Dependence on supplemental oxygen: Secondary | ICD-10-CM

## 2017-06-16 DIAGNOSIS — I248 Other forms of acute ischemic heart disease: Secondary | ICD-10-CM | POA: Diagnosis present

## 2017-06-16 DIAGNOSIS — I5032 Chronic diastolic (congestive) heart failure: Secondary | ICD-10-CM

## 2017-06-16 DIAGNOSIS — R748 Abnormal levels of other serum enzymes: Secondary | ICD-10-CM | POA: Diagnosis not present

## 2017-06-16 DIAGNOSIS — J9611 Chronic respiratory failure with hypoxia: Secondary | ICD-10-CM | POA: Diagnosis present

## 2017-06-16 DIAGNOSIS — Y92009 Unspecified place in unspecified non-institutional (private) residence as the place of occurrence of the external cause: Secondary | ICD-10-CM | POA: Diagnosis not present

## 2017-06-16 DIAGNOSIS — Z7951 Long term (current) use of inhaled steroids: Secondary | ICD-10-CM

## 2017-06-16 DIAGNOSIS — E785 Hyperlipidemia, unspecified: Secondary | ICD-10-CM | POA: Diagnosis present

## 2017-06-16 DIAGNOSIS — Z91138 Patient's unintentional underdosing of medication regimen for other reason: Secondary | ICD-10-CM | POA: Diagnosis not present

## 2017-06-16 DIAGNOSIS — R7989 Other specified abnormal findings of blood chemistry: Secondary | ICD-10-CM | POA: Diagnosis present

## 2017-06-16 DIAGNOSIS — I361 Nonrheumatic tricuspid (valve) insufficiency: Secondary | ICD-10-CM | POA: Diagnosis not present

## 2017-06-16 DIAGNOSIS — I5031 Acute diastolic (congestive) heart failure: Secondary | ICD-10-CM | POA: Diagnosis not present

## 2017-06-16 DIAGNOSIS — I5033 Acute on chronic diastolic (congestive) heart failure: Secondary | ICD-10-CM

## 2017-06-16 DIAGNOSIS — I10 Essential (primary) hypertension: Secondary | ICD-10-CM | POA: Diagnosis not present

## 2017-06-16 DIAGNOSIS — Z7982 Long term (current) use of aspirin: Secondary | ICD-10-CM | POA: Diagnosis not present

## 2017-06-16 DIAGNOSIS — J441 Chronic obstructive pulmonary disease with (acute) exacerbation: Secondary | ICD-10-CM | POA: Diagnosis present

## 2017-06-16 DIAGNOSIS — K760 Fatty (change of) liver, not elsewhere classified: Secondary | ICD-10-CM | POA: Diagnosis present

## 2017-06-16 DIAGNOSIS — T380X6A Underdosing of glucocorticoids and synthetic analogues, initial encounter: Secondary | ICD-10-CM | POA: Diagnosis present

## 2017-06-16 DIAGNOSIS — I130 Hypertensive heart and chronic kidney disease with heart failure and stage 1 through stage 4 chronic kidney disease, or unspecified chronic kidney disease: Secondary | ICD-10-CM | POA: Diagnosis not present

## 2017-06-16 DIAGNOSIS — I214 Non-ST elevation (NSTEMI) myocardial infarction: Secondary | ICD-10-CM | POA: Diagnosis not present

## 2017-06-16 DIAGNOSIS — N179 Acute kidney failure, unspecified: Secondary | ICD-10-CM | POA: Diagnosis present

## 2017-06-16 DIAGNOSIS — R778 Other specified abnormalities of plasma proteins: Secondary | ICD-10-CM

## 2017-06-16 DIAGNOSIS — K761 Chronic passive congestion of liver: Secondary | ICD-10-CM | POA: Diagnosis present

## 2017-06-16 DIAGNOSIS — Z87891 Personal history of nicotine dependence: Secondary | ICD-10-CM

## 2017-06-16 DIAGNOSIS — Z8673 Personal history of transient ischemic attack (TIA), and cerebral infarction without residual deficits: Secondary | ICD-10-CM | POA: Diagnosis not present

## 2017-06-16 DIAGNOSIS — R011 Cardiac murmur, unspecified: Secondary | ICD-10-CM | POA: Diagnosis present

## 2017-06-16 DIAGNOSIS — R7401 Elevation of levels of liver transaminase levels: Secondary | ICD-10-CM

## 2017-06-16 LAB — CBC WITH DIFFERENTIAL/PLATELET
BASOS PCT: 0 %
Basophils Absolute: 0 10*3/uL (ref 0.0–0.1)
Eosinophils Absolute: 0.1 10*3/uL (ref 0.0–0.7)
Eosinophils Relative: 2 %
HEMATOCRIT: 32.2 % — AB (ref 39.0–52.0)
Hemoglobin: 9.6 g/dL — ABNORMAL LOW (ref 13.0–17.0)
Lymphocytes Relative: 15 %
Lymphs Abs: 1 10*3/uL (ref 0.7–4.0)
MCH: 28.6 pg (ref 26.0–34.0)
MCHC: 29.8 g/dL — ABNORMAL LOW (ref 30.0–36.0)
MCV: 95.8 fL (ref 78.0–100.0)
MONO ABS: 0.6 10*3/uL (ref 0.1–1.0)
Monocytes Relative: 9 %
NEUTROS ABS: 4.8 10*3/uL (ref 1.7–7.7)
NEUTROS PCT: 74 %
Platelets: 183 10*3/uL (ref 150–400)
RBC: 3.36 MIL/uL — ABNORMAL LOW (ref 4.22–5.81)
RDW: 13.9 % (ref 11.5–15.5)
WBC: 6.5 10*3/uL (ref 4.0–10.5)

## 2017-06-16 LAB — COMPREHENSIVE METABOLIC PANEL
ALT: 113 U/L — ABNORMAL HIGH (ref 17–63)
AST: 118 U/L — ABNORMAL HIGH (ref 15–41)
Albumin: 3.5 g/dL (ref 3.5–5.0)
Alkaline Phosphatase: 48 U/L (ref 38–126)
Anion gap: 9 (ref 5–15)
BILIRUBIN TOTAL: 0.8 mg/dL (ref 0.3–1.2)
BUN: 26 mg/dL — ABNORMAL HIGH (ref 6–20)
CHLORIDE: 96 mmol/L — AB (ref 101–111)
CO2: 36 mmol/L — ABNORMAL HIGH (ref 22–32)
Calcium: 10.5 mg/dL — ABNORMAL HIGH (ref 8.9–10.3)
Creatinine, Ser: 1.62 mg/dL — ABNORMAL HIGH (ref 0.61–1.24)
GFR calc Af Amer: 45 mL/min — ABNORMAL LOW (ref >60)
GFR calc non Af Amer: 38 mL/min — ABNORMAL LOW (ref >60)
GLUCOSE: 144 mg/dL — AB (ref 65–99)
POTASSIUM: 4.3 mmol/L (ref 3.5–5.1)
Sodium: 141 mmol/L (ref 135–145)
TOTAL PROTEIN: 6.7 g/dL (ref 6.5–8.1)

## 2017-06-16 LAB — TROPONIN I
TROPONIN I: 0.65 ng/mL — AB (ref <0.03)
Troponin I: 0.08 ng/mL (ref <0.03)
Troponin I: 0.31 ng/mL (ref <0.03)

## 2017-06-16 LAB — BRAIN NATRIURETIC PEPTIDE: B Natriuretic Peptide: 603 pg/mL — ABNORMAL HIGH (ref 0.0–100.0)

## 2017-06-16 MED ORDER — SODIUM CHLORIDE 0.9% FLUSH
3.0000 mL | Freq: Two times a day (BID) | INTRAVENOUS | Status: DC
  Administered 2017-06-16 – 2017-06-18 (×4): 3 mL via INTRAVENOUS

## 2017-06-16 MED ORDER — IPRATROPIUM-ALBUTEROL 0.5-2.5 (3) MG/3ML IN SOLN
3.0000 mL | Freq: Three times a day (TID) | RESPIRATORY_TRACT | Status: DC
  Administered 2017-06-17 – 2017-06-18 (×4): 3 mL via RESPIRATORY_TRACT
  Filled 2017-06-16 (×5): qty 3

## 2017-06-16 MED ORDER — FUROSEMIDE 10 MG/ML IJ SOLN
40.0000 mg | Freq: Once | INTRAMUSCULAR | Status: AC
  Administered 2017-06-16: 40 mg via INTRAVENOUS
  Filled 2017-06-16: qty 4

## 2017-06-16 MED ORDER — ASPIRIN EC 81 MG PO TBEC
81.0000 mg | DELAYED_RELEASE_TABLET | Freq: Every day | ORAL | Status: DC
  Administered 2017-06-17 – 2017-06-18 (×2): 81 mg via ORAL
  Filled 2017-06-16 (×2): qty 1

## 2017-06-16 MED ORDER — CLONIDINE HCL 0.1 MG PO TABS
0.1000 mg | ORAL_TABLET | Freq: Two times a day (BID) | ORAL | Status: DC
  Administered 2017-06-16: 0.1 mg via ORAL
  Filled 2017-06-16: qty 1

## 2017-06-16 MED ORDER — NABUMETONE 500 MG PO TABS
500.0000 mg | ORAL_TABLET | Freq: Two times a day (BID) | ORAL | Status: DC
  Filled 2017-06-16 (×6): qty 1

## 2017-06-16 MED ORDER — ONDANSETRON HCL 4 MG PO TABS: 4.0000 mg | ORAL_TABLET | Freq: Four times a day (QID) | ORAL | Status: DC | PRN

## 2017-06-16 MED ORDER — FUROSEMIDE 10 MG/ML IJ SOLN
20.0000 mg | Freq: Two times a day (BID) | INTRAMUSCULAR | Status: DC
  Administered 2017-06-17: 20 mg via INTRAVENOUS
  Filled 2017-06-16: qty 2

## 2017-06-16 MED ORDER — METOPROLOL TARTRATE 25 MG PO TABS
25.0000 mg | ORAL_TABLET | Freq: Two times a day (BID) | ORAL | Status: DC
  Administered 2017-06-16 – 2017-06-18 (×4): 25 mg via ORAL
  Filled 2017-06-16 (×4): qty 1

## 2017-06-16 MED ORDER — ENOXAPARIN SODIUM 40 MG/0.4ML ~~LOC~~ SOLN
40.0000 mg | SUBCUTANEOUS | Status: DC
  Administered 2017-06-16 – 2017-06-17 (×2): 40 mg via SUBCUTANEOUS
  Filled 2017-06-16 (×2): qty 0.4

## 2017-06-16 MED ORDER — ALBUTEROL SULFATE (2.5 MG/3ML) 0.083% IN NEBU: 2.5000 mg | INHALATION_SOLUTION | Freq: Four times a day (QID) | RESPIRATORY_TRACT | Status: DC | PRN

## 2017-06-16 MED ORDER — LOSARTAN POTASSIUM 50 MG PO TABS
100.0000 mg | ORAL_TABLET | Freq: Every day | ORAL | Status: DC
  Administered 2017-06-17 – 2017-06-18 (×2): 100 mg via ORAL
  Filled 2017-06-16 (×2): qty 2

## 2017-06-16 MED ORDER — ONDANSETRON HCL 4 MG/2ML IJ SOLN: 4.0000 mg | Freq: Four times a day (QID) | INTRAMUSCULAR | Status: DC | PRN

## 2017-06-16 MED ORDER — GUAIFENESIN ER 600 MG PO TB12
600.0000 mg | ORAL_TABLET | Freq: Two times a day (BID) | ORAL | Status: DC
  Administered 2017-06-16 – 2017-06-18 (×4): 600 mg via ORAL
  Filled 2017-06-16 (×4): qty 1

## 2017-06-16 MED ORDER — AMLODIPINE BESYLATE 5 MG PO TABS
10.0000 mg | ORAL_TABLET | Freq: Every day | ORAL | Status: DC
  Administered 2017-06-17 – 2017-06-18 (×2): 10 mg via ORAL
  Filled 2017-06-16 (×2): qty 2

## 2017-06-16 MED ORDER — IPRATROPIUM-ALBUTEROL 0.5-2.5 (3) MG/3ML IN SOLN
3.0000 mL | Freq: Four times a day (QID) | RESPIRATORY_TRACT | Status: DC
  Administered 2017-06-16: 3 mL via RESPIRATORY_TRACT
  Filled 2017-06-16: qty 3

## 2017-06-16 MED ORDER — SODIUM CHLORIDE 0.9 % IV SOLN: 250.0000 mL | INTRAVENOUS | Status: DC | PRN

## 2017-06-16 MED ORDER — POLYETHYLENE GLYCOL 3350 17 G PO PACK: 17.0000 g | PACK | Freq: Every day | ORAL | Status: DC | PRN

## 2017-06-16 MED ORDER — SODIUM CHLORIDE 0.9% FLUSH: 3.0000 mL | INTRAVENOUS | Status: DC | PRN

## 2017-06-16 MED ORDER — ESCITALOPRAM OXALATE 10 MG PO TABS
10.0000 mg | ORAL_TABLET | Freq: Every day | ORAL | Status: DC
  Administered 2017-06-17 – 2017-06-18 (×2): 10 mg via ORAL
  Filled 2017-06-16 (×2): qty 1

## 2017-06-16 MED ORDER — GABAPENTIN 300 MG PO CAPS
300.0000 mg | ORAL_CAPSULE | Freq: Three times a day (TID) | ORAL | Status: DC
  Administered 2017-06-16 – 2017-06-18 (×5): 300 mg via ORAL
  Filled 2017-06-16 (×5): qty 1

## 2017-06-16 NOTE — ED Triage Notes (Signed)
Per ems, pt was recently admitted for copd.  Reports has been increasingly sob with exertion for the past 2 weeks.  Pt on home o2 at 3liters.   EMS arrived and pt had already started breathing better.  EMS says pt said his symptoms improve once he is still for a while.  Pt alert and oriented, denies pain.

## 2017-06-16 NOTE — H&P (Addendum)
TRH H&P    Patient Demographics:    Harold Johnson, is a 83 y.o. male  MRN: [id number]  DOB - 02-16-1935  Admit Date - 06/16/2017  Referring MD/NP/PA: Dr. Ranae Palms  Outpatient Primary MD for the patient is Samuel Jester, DO  Patient coming from: Home  Chief complaint-shortness of breath   HPI:    Harold Johnson  is a 83 y.o. male, with history of COPD, hypertension, hyperlipidemia, stroke, diastolic CHF came to hospital with worsening shortness of breath.  Patient was discharged recently from hospital on June 08, 2017 after patient was treated for COPD exacerbation.  He was sent home on doxycycline along with tapering dose of prednisone.  Patient completed the last dose of prednisone this morning. Complains of shortness of breath.  Which has been worse on lying flat.  Also complains of productive cough but no fever or chills.  He has noticed increased swelling of the lower extremities bilaterally.  Patient is on home oxygen 3 L/min via nasal cannula. In the ED Patient was found to have elevated BNP 603.0, troponin was 0.31.  Cardiology was consulted by the ED physician.  No recommendation for heparin at this time. Patient received 40 mg IV Lasix in the ED.   Review of systems:    I  All other systems reviewed and are negative.   With Past History of the following :    Past Medical History:  Diagnosis Date  . Acute respiratory failure with hypercapnia (HCC) 10/2011   Status post intubation and ventilation.  . Anemia 10/12/2011  . Chronic back pain   . Chronic respiratory failure with hypoxia (HCC)   . Chronic respiratory failure with hypoxia (HCC)   . COPD (chronic obstructive pulmonary disease) (HCC)    Oxygen-dependent. C02 retainer  . Elevated CK 10/13/2011   Possibly statin-induced rhabdomyolysis.  . Hypercholesteremia   . Hypertension   . Pneumonia   . Right axis deviation   . Stroke [facility])      Past Surgical History:  Procedure Laterality Date  . fatty tumor     Excision from left shoulder.  . INGUINAL HERNIA REPAIR        Social History:      Social History   Tobacco Use  . Smoking status: Former Games developer  . Smokeless tobacco: Never Used  Substance Use Topics  . Alcohol use: No    Comment: used to       Family History :   No family history of cancer   Home Medications:   Prior to Admission medications   Medication Sig Start Date End Date Taking? Authorizing Provider  albuterol (PROVENTIL) (2.5 MG/3ML) 0.083% nebulizer solution Take 3 mLs (2.5 mg total) by nebulization every 6 (six) hours as needed for wheezing or shortness of breath. 06/08/17  Yes Erick Blinks, MD  amLODipine (NORVASC) 10 MG tablet Take 10 mg by mouth daily.    Yes [provider]  aspirin EC 81 MG tablet Take 81 mg by mouth daily.   Yes [provider]  cloNIDine (CATAPRES)  0.1 MG tablet Take 0.1 mg by mouth at bedtime.   Yes [provider]  doxycycline (VIBRA-TABS) 100 MG tablet Take 1 tablet (100 mg total) by mouth every 12 (twelve) hours. 06/08/17  Yes Erick Blinks, MD  escitalopram (LEXAPRO) 10 MG tablet Take 10 mg by mouth daily.   Yes [provider]  gabapentin (NEURONTIN) 300 MG capsule Take 1 capsule (300 mg total) by mouth at bedtime. Patient taking differently: Take 300 mg by mouth 3 (three) times daily.  10/24/11  Yes Elliot Cousin, MD  guaiFENesin (MUCINEX) 600 MG 12 hr tablet Take 1 tablet (600 mg total) by mouth 2 (two) times daily. 06/08/17  Yes Erick Blinks, MD  losartan-hydrochlorothiazide (HYZAAR) 100-25 MG tablet Take 1 tablet by mouth daily.   Yes [provider]  metoprolol tartrate (LOPRESSOR) 25 MG tablet Take 1 tablet (25 mg total) by mouth 2 (two) times daily. 06/08/17 06/08/18 Yes Erick Blinks, MD  nabumetone (RELAFEN) 500 MG tablet Take 500 mg by mouth 2 (two) times daily.   Yes [provider]  OXYGEN Inhale  2-3 L into the lungs daily.   Yes [provider]  polyethylene glycol (MIRALAX / GLYCOLAX) packet Take 17 g by mouth daily as needed for mild constipation. 06/08/17  Yes Erick Blinks, MD  predniSONE (DELTASONE) 10 MG tablet Take 40mg  po daily for 2 days then 30mg  daily for 2 days then 20mg  daily for 2 days then 10mg  daily for 2 days then stop 06/08/17  Yes Memon, Durward Mallard, MD  Tiotropium Bromide-Olodaterol (STIOLTO RESPIMAT) 2.5-2.5 MCG/ACT AERS Inhale 2 puffs into the lungs daily.   Yes [provider]  VENTOLIN HFA 108 (90 Base) MCG/ACT inhaler Inhale 2 puffs into the lungs every 4 (four) hours as needed for wheezing.  12/26/16  Yes [provider]     Allergies:    No Known Allergies   Physical Exam:   Vitals  Blood pressure (!) 154/90, pulse 68, temperature 98.2 F (36.8 C), temperature source Oral, resp. rate 18, SpO2 99 %.  1.  General: Appears in no acute distress  2. Psychiatric:  Intact judgement and  insight, awake alert, oriented x 3.  3. Neurologic: No focal neurological deficits, all cranial nerves intact.Strength 5/5 all 4 extremities, sensation intact all 4 extremities, plantars down going.  4. Eyes :  anicteric sclerae, moist conjunctivae with no lid lag. PERRLA.  5. ENMT:  Oropharynx clear with moist mucous membranes and good dentition  6. Neck:  supple, no cervical lymphadenopathy appriciated, No thyromegaly  7. Respiratory : Normal respiratory effort, decreased breath sounds bilaterally at lung bases  8. Cardiovascular : RRR, no gallops, rubs or murmurs, trace leg edema bilaterally.  9. Gastrointestinal:  Positive bowel sounds, abdomen soft, non-tender to palpation,no hepatosplenomegaly, no rigidity or guarding       10. Skin:  No cyanosis, normal texture and turgor, no rash, lesions or ulcers  11.Musculoskeletal:  Good muscle tone,  joints appear normal , no effusions,  normal range of motion    Data Review:     CBC Recent Labs  Lab 06/16/17 1349  WBC 6.5  HGB 9.6*  HCT 32.2*  PLT 183  MCV 95.8  MCH 28.6  MCHC 29.8*  RDW 13.9  LYMPHSABS 1.0  MONOABS 0.6  EOSABS 0.1  BASOSABS 0.0   ------------------------------------------------------------------------------------------------------------------  Chemistries  Recent Labs  Lab 06/16/17 1349  NA 141  K 4.3  CL 96*  CO2 36*  GLUCOSE 144*  BUN 26*  CREATININE 1.62*  CALCIUM 10.5*  AST 118*  ALT 113*  ALKPHOS 48  BILITOT 0.8   ------------------------------------------------------------------------------------------------------------------  ------------------------------------------------------------------------------------------------------------------ GFR: Estimated Creatinine Clearance: 40.3 mL/min (A) (by C-G formula based on SCr of 1.62 mg/dL (H)). Liver Function Tests: Recent Labs  Lab 06/16/17 1349  AST 118*  ALT 113*  ALKPHOS 48  BILITOT 0.8  PROT 6.7  ALBUMIN 3.5   No results for input(s): LIPASE, AMYLASE in the last 168 hours. No results for input(s): AMMONIA in the last 168 hours. Coagulation Profile: No results for input(s): INR, PROTIME in the last 168 hours. Cardiac Enzymes: Recent Labs  Lab 06/16/17 1349 06/16/17 1720  TROPONINI 0.08* 0.31*   BNP (last 3 results) No results for input(s): PROBNP in the last 8760 hours. HbA1C: No results for input(s): HGBA1C in the last 72 hours. CBG: No results for input(s): GLUCAP in the last 168 hours. Lipid Profile: No results for input(s): CHOL, HDL, LDLCALC, TRIG, CHOLHDL, LDLDIRECT in the last 72 hours. Thyroid Function Tests: No results for input(s): TSH, T4TOTAL, FREET4, T3FREE, THYROIDAB in the last 72 hours. Anemia Panel: No results for input(s): VITAMINB12, FOLATE, FERRITIN, TIBC, IRON, RETICCTPCT in the last 72 hours.  --------------------------------------------------------------------------------------------------------------- Urine analysis:     Component Value Date/Time   COLORURINE YELLOW 10/12/2011 1019   APPEARANCEUR CLEAR 10/12/2011 1019   LABSPEC >1.030 (H) 10/12/2011 1019   PHURINE 6.0 10/12/2011 1019   GLUCOSEU NEGATIVE 10/12/2011 1019   HGBUR LARGE (A) 10/12/2011 1019   BILIRUBINUR NEGATIVE 10/12/2011 1019   KETONESUR NEGATIVE 10/12/2011 1019   PROTEINUR 100 (A) 10/12/2011 1019   UROBILINOGEN 0.2 10/12/2011 1019   NITRITE NEGATIVE 10/12/2011 1019   LEUKOCYTESUR NEGATIVE 10/12/2011 1019      Imaging Results:    Dg Chest 2 View  Result Date: 06/16/2017 CLINICAL DATA:  Short of breath EXAM: CHEST - 2 VIEW COMPARISON:  06/07/2017 FINDINGS: Heart size upper normal. Pulmonary artery enlargement similar to the prior study compatible with pulmonary artery hypertension. Negative for heart failure. Negative for infiltrate effusion or mass. IMPRESSION: Findings consistent with pulmonary artery hypertension. No acute cardiopulmonary abnormality. Electronically Signed   By: Marlan Palauharles  Clark M.D.   On: 06/16/2017 14:06    My personal review of EKG: Rhythm NSR, nonspecific ST changes    Assessment & Plan:    Active Problems:   Hypertension   Elevated troponin   CKD (chronic kidney disease) stage 3, GFR 30-59 ml/min (HCC)   Acute diastolic CHF (congestive heart failure) (HCC)   1. Acute on chronic diastolic CHF-patient presenting with worsening shortness of breath, BNP is elevated.  Echocardiogram from 2013 showed EF 65-70%, grade 1 diastolic dysfunction.  Will obtain echocardiogram in a.m.  Patient received 40 mg Lasix in the ED, will start Lasix 20 mg IV every 12 hours from tonight.  Follow BMP in a.m.  Strict intake and output.  Daily weights  2. Elevated  Troponin-troponin elevated 0.31, likely from demand ischemia from uncontrolled hypertension, acute diastolic CHF.  Patient does not have chest pain.  Will cycle troponin every 6 hours x3.  Consult cardiology in a.m. Dr Purvis SheffieldKoneswaran was consulted by the ED physician, he did  not recommend heparin at this time.  We will continue to monitor patient.  Continue aspirin 81 mg p.o. Daily  3. Hypertension-uncontrolled, patient is on multiple antihypertensive medications.  He is on Catapres 0.1 mg at bedtime, I will change Catapres to 0.1 mg p.o. twice daily and continue with other home medications including amlodipine, metoprolol,  Losartan.  Will hold HCTZ as patient has been started on IV Lasix.   4. COPD-does not appear to be in acute exacerbation, continue duo nebs every 6 hours.  5. Chronic kidney disease stage III-creatinine is 1.6, around baseline follow BMP in a.m.Marland Kitchen   DVT Prophylaxis-   Lovenox   AM Labs Ordered, also please review Full Orders  Family Communication: Admission, patients condition and plan of care including tests being ordered have been discussed with the patient  who indicate understanding and agree with the plan and Code Status.  Code Status: Full code  Admission status: Inpatient  Time spent in minutes : 60 minutes   Meredeth Ide M.D on 06/16/2017 at 7:48 PM  Between 7am to 7pm - Pager - [phone]. After 7pm go to [email or web address] - password [facility]  [facility] - Office  [phone]

## 2017-06-16 NOTE — ED Provider Notes (Signed)
[facility] EMERGENCY DEPARTMENT Provider Note   CSN: [id number] Arrival date & time: 06/16/17  1301     History   Chief Complaint Chief Complaint  Patient presents with  . Shortness of Breath    HPI Harold Johnson is a 83 y.o. male.  HPI Patient recently admitted for COPD exacerbation.  States over the last days had increasing shortness of breath.  States that the shortness of breath is worse when trying to lie flat.  Has had a productive cough but denies any fever or chills.  States he has had increased lower extremity swelling bilaterally.  Denies chest pain.  Is on 3 L of home O2 by nasal cannula. Past Medical History:  Diagnosis Date  . Acute respiratory failure with hypercapnia (HCC) 10/2011   Status post intubation and ventilation.  . Anemia 10/12/2011  . Chronic back pain   . Chronic respiratory failure with hypoxia (HCC)   . Chronic respiratory failure with hypoxia (HCC)   . COPD (chronic obstructive pulmonary disease) (HCC)    Oxygen-dependent. C02 retainer  . Elevated CK 10/13/2011   Possibly statin-induced rhabdomyolysis.  . Hypercholesteremia   . Hypertension   . Pneumonia   . Right axis deviation   . Stroke [facility])     Patient Active Problem List   Diagnosis Date Noted  . Acute on chronic respiratory failure with hypoxia (HCC) 06/07/2017  . CKD (chronic kidney disease) stage 3, GFR 30-59 ml/min (HCC) 06/07/2017  . COPD exacerbation (HCC) 01/16/2017  . AKI (acute kidney injury) (HCC) 01/16/2017  . Supplemental oxygen dependent 01/16/2017  . Elevated troponin 01/16/2017  . Hypernatremia 10/22/2011  . Debility 10/22/2011  . Dysphagia 10/21/2011  . Lethargy 10/21/2011  . Sinusitis 10/21/2011  . Acute renal failure (HCC) 10/15/2011  . Encephalopathy acute 10/14/2011  . Rhabdomyolysis 10/13/2011  . Hyperglycemia, drug-induced 10/13/2011  . COPD with acute exacerbation (HCC) 10/12/2011  . Tachycardia 10/12/2011  . Right axis deviation 10/12/2011  . Anemia  10/12/2011  . Chronic respiratory failure with hypoxia (HCC) 10/12/2011  . Acute respiratory failure with hypercapnia (HCC) 10/12/2011  . Chronic back pain 10/12/2011  . PNA (pneumonia) 10/12/2011  . Hypertension 10/12/2011    Past Surgical History:  Procedure Laterality Date  . fatty tumor     Excision from left shoulder.  . INGUINAL HERNIA REPAIR         Home Medications    Prior to Admission medications   Medication Sig Start Date End Date Taking? Authorizing Provider  albuterol (PROVENTIL) (2.5 MG/3ML) 0.083% nebulizer solution Take 3 mLs (2.5 mg total) by nebulization every 6 (six) hours as needed for wheezing or shortness of breath. 06/08/17  Yes Erick Blinks, MD  amLODipine (NORVASC) 10 MG tablet Take 10 mg by mouth daily.    Yes [provider]  aspirin EC 81 MG tablet Take 81 mg by mouth daily.   Yes [provider]  cloNIDine (CATAPRES) 0.1 MG tablet Take 0.1 mg by mouth at bedtime.   Yes [provider]  doxycycline (VIBRA-TABS) 100 MG tablet Take 1 tablet (100 mg total) by mouth every 12 (twelve) hours. 06/08/17  Yes Erick Blinks, MD  escitalopram (LEXAPRO) 10 MG tablet Take 10 mg by mouth daily.   Yes [provider]  gabapentin (NEURONTIN) 300 MG capsule Take 1 capsule (300 mg total) by mouth at bedtime. Patient taking differently: Take 300 mg by mouth 3 (three) times daily.  10/24/11  Yes Elliot Cousin, MD  guaiFENesin (MUCINEX) 600 MG  12 hr tablet Take 1 tablet (600 mg total) by mouth 2 (two) times daily. 06/08/17  Yes Erick Blinks, MD  losartan-hydrochlorothiazide (HYZAAR) 100-25 MG tablet Take 1 tablet by mouth daily.   Yes [provider]  metoprolol tartrate (LOPRESSOR) 25 MG tablet Take 1 tablet (25 mg total) by mouth 2 (two) times daily. 06/08/17 06/08/18 Yes Erick Blinks, MD  nabumetone (RELAFEN) 500 MG tablet Take 500 mg by mouth 2 (two) times daily.   Yes [provider]  OXYGEN Inhale 2-3 L into the  lungs daily.   Yes [provider]  polyethylene glycol (MIRALAX / GLYCOLAX) packet Take 17 g by mouth daily as needed for mild constipation. 06/08/17  Yes Erick Blinks, MD  predniSONE (DELTASONE) 10 MG tablet Take 40mg  po daily for 2 days then 30mg  daily for 2 days then 20mg  daily for 2 days then 10mg  daily for 2 days then stop 06/08/17  Yes Memon, Durward Mallard, MD  Tiotropium Bromide-Olodaterol (STIOLTO RESPIMAT) 2.5-2.5 MCG/ACT AERS Inhale 2 puffs into the lungs daily.   Yes [provider]  VENTOLIN HFA 108 (90 Base) MCG/ACT inhaler Inhale 2 puffs into the lungs every 4 (four) hours as needed for wheezing.  12/26/16  Yes [provider]    Family History No family history on file.  Social History Social History   Tobacco Use  . Smoking status: Former Games developer  . Smokeless tobacco: Never Used  Substance Use Topics  . Alcohol use: No    Comment: used to  . Drug use: No     Allergies   Patient has no known allergies.   Review of Systems Review of Systems  Constitutional: Negative for chills and fever.  HENT: Positive for congestion and sinus pressure. Negative for sore throat and trouble swallowing.   Eyes: Negative for photophobia and visual disturbance.  Respiratory: Positive for cough and shortness of breath.   Cardiovascular: Positive for leg swelling. Negative for chest pain and palpitations.  Gastrointestinal: Negative for abdominal pain, constipation, diarrhea, nausea and vomiting.  Genitourinary: Negative for dysuria, flank pain and frequency.  Musculoskeletal: Negative for back pain, myalgias, neck pain and neck stiffness.  Skin: Negative for rash and wound.  Neurological: Negative for dizziness, weakness, light-headedness, numbness and headaches.  All other systems reviewed and are negative.    Physical Exam Updated Vital Signs BP (!) 154/90   Pulse 68   Temp 98.2 F (36.8 C) (Oral)   Resp 18   SpO2 99%   Physical Exam    Constitutional: He is oriented to person, place, and time. He appears well-developed and well-nourished. No distress.  HENT:  Head: Normocephalic and atraumatic.  Mouth/Throat: Oropharynx is clear and moist.  Bilateral nasal mucosal edema.  Eyes: EOM are normal. Pupils are equal, round, and reactive to light.  Neck: Normal range of motion. Neck supple. JVD present.  Cardiovascular: Normal rate and regular rhythm. Exam reveals no gallop and no friction rub.  Murmur heard. Harsh systolic murmur heard best at the left sternal border.  Pulmonary/Chest:  Decreased air movement throughout.  Crackles in bilateral bases.  Abdominal: Soft. Bowel sounds are normal. There is no tenderness. There is no rebound and no guarding.  Musculoskeletal: Normal range of motion. He exhibits edema. He exhibits no tenderness.  1+ bilateral lower extremity pitting edema.  No asymmetry or tenderness.  Lymphadenopathy:    He has no cervical adenopathy.  Neurological: He is alert and oriented to person, place, and time.  Moves all extremities  without focal deficit.  Sensation intact.  Skin: Skin is warm and dry. Capillary refill takes less than 2 seconds. No rash noted. He is not diaphoretic. No erythema.  Psychiatric: He has a normal mood and affect. His behavior is normal.  Nursing note and vitals reviewed.    ED Treatments / Results  Labs (all labs ordered are listed, but only abnormal results are displayed) Labs Reviewed  BRAIN NATRIURETIC PEPTIDE - Abnormal; Notable for the following components:      Result Value   B Natriuretic Peptide 603.0 (*)    All other components within normal limits  CBC WITH DIFFERENTIAL/PLATELET - Abnormal; Notable for the following components:   RBC 3.36 (*)    Hemoglobin 9.6 (*)    HCT 32.2 (*)    MCHC 29.8 (*)    All other components within normal limits  COMPREHENSIVE METABOLIC PANEL - Abnormal; Notable for the following components:   Chloride 96 (*)    CO2 36 (*)     Glucose, Bld 144 (*)    BUN 26 (*)    Creatinine, Ser 1.62 (*)    Calcium 10.5 (*)    AST 118 (*)    ALT 113 (*)    GFR calc non Af Amer 38 (*)    GFR calc Af Amer 45 (*)    All other components within normal limits  TROPONIN I - Abnormal; Notable for the following components:   Troponin I 0.08 (*)    All other components within normal limits  TROPONIN I - Abnormal; Notable for the following components:   Troponin I 0.31 (*)    All other components within normal limits    EKG  EKG Interpretation  Date/Time:  Tuesday June 16 2017 13:28:18 EDT Ventricular Rate:  75 PR Interval:    QRS Duration: 108 QT Interval:  385 QTC Calculation: 447 R Axis:   102 Text Interpretation:  Sinus rhythm Ventricular premature complex Probable RVH w/ secondary repol abnormality Borderline ST elevation, lateral leads Confirmed by Loren RacerYelverton, Payten Hobin ([id number]) on 06/16/2017 1:40:47 PM       Radiology Dg Chest 2 View  Result Date: 06/16/2017 CLINICAL DATA:  Short of breath EXAM: CHEST - 2 VIEW COMPARISON:  06/07/2017 FINDINGS: Heart size upper normal. Pulmonary artery enlargement similar to the prior study compatible with pulmonary artery hypertension. Negative for heart failure. Negative for infiltrate effusion or mass. IMPRESSION: Findings consistent with pulmonary artery hypertension. No acute cardiopulmonary abnormality. Electronically Signed   By: Marlan Palauharles  Clark M.D.   On: 06/16/2017 14:06    Procedures Procedures (including critical care time)  Medications Ordered in ED Medications  furosemide (LASIX) injection 40 mg (40 mg Intravenous Given 06/16/17 1546)     Initial Impression / Assessment and Plan / ED Course  I have reviewed the triage vital signs and the nursing notes.  Pertinent labs & imaging results that were available during my care of the patient were reviewed by me and considered in my medical decision making (see chart for details).    BNP is elevated.  X-ray without acute  findings.  Suspect mild CHF contributing to his dyspnea.  Given dose of IV Lasix.  Patient also had mild elevation on initial troponin.  Continues to deny any chest pain.  Delta troponin is continues to trend up.  Patient has diuresed several hundred cc's of urine.  States his shortness of breath has improved.  Discussed with cardiologist, Dr. Purvis SheffieldKoneswaran.  Recommends repeat dose of IV Lasix and trending  troponin.  Would not start heparin at this point.  Discussed with hospitalist regarding admission.  Discussed with hospitalist will see patient in the emergency department and admit. Final Clinical Impressions(s) / ED Diagnoses   Final diagnoses:  Elevated troponin  Acute congestive heart failure, unspecified heart failure type [facility])    ED Discharge Orders    None       Loren Racer, MD 06/16/17 [phone]

## 2017-06-16 NOTE — ED Notes (Signed)
CRITICAL VALUE ALERT  Critical Value:  Troponin 0.08  Date & Time Notied:  06-16-17 15:00  Provider Notified: Lajuana RippleYelverton,MD   Orders Received/Actions taken: Continue to monitor

## 2017-06-16 NOTE — ED Notes (Signed)
Pt ambulated around ED x 2 w/ 3L O2 via [place]. Pt tolerated well. Denied any pain or sob. Pt sat remained in high 90s. Hr started at 85 bpm, 124 bpm at max.

## 2017-06-17 ENCOUNTER — Inpatient Hospital Stay (HOSPITAL_COMMUNITY): Payer: Medicare HMO

## 2017-06-17 DIAGNOSIS — T50905A Adverse effect of unspecified drugs, medicaments and biological substances, initial encounter: Secondary | ICD-10-CM

## 2017-06-17 DIAGNOSIS — I5032 Chronic diastolic (congestive) heart failure: Secondary | ICD-10-CM

## 2017-06-17 DIAGNOSIS — R740 Nonspecific elevation of levels of transaminase and lactic acid dehydrogenase [LDH]: Secondary | ICD-10-CM

## 2017-06-17 DIAGNOSIS — I10 Essential (primary) hypertension: Secondary | ICD-10-CM

## 2017-06-17 DIAGNOSIS — R748 Abnormal levels of other serum enzymes: Secondary | ICD-10-CM

## 2017-06-17 DIAGNOSIS — I361 Nonrheumatic tricuspid (valve) insufficiency: Secondary | ICD-10-CM

## 2017-06-17 DIAGNOSIS — J441 Chronic obstructive pulmonary disease with (acute) exacerbation: Secondary | ICD-10-CM

## 2017-06-17 DIAGNOSIS — R739 Hyperglycemia, unspecified: Secondary | ICD-10-CM

## 2017-06-17 DIAGNOSIS — R7401 Elevation of levels of liver transaminase levels: Secondary | ICD-10-CM

## 2017-06-17 LAB — COMPREHENSIVE METABOLIC PANEL
ALT: 84 U/L — AB (ref 17–63)
AST: 73 U/L — AB (ref 15–41)
Albumin: 3.2 g/dL — ABNORMAL LOW (ref 3.5–5.0)
Alkaline Phosphatase: 43 U/L (ref 38–126)
Anion gap: 9 (ref 5–15)
BILIRUBIN TOTAL: 0.8 mg/dL (ref 0.3–1.2)
BUN: 28 mg/dL — AB (ref 6–20)
CO2: 38 mmol/L — ABNORMAL HIGH (ref 22–32)
CREATININE: 1.63 mg/dL — AB (ref 0.61–1.24)
Calcium: 10.4 mg/dL — ABNORMAL HIGH (ref 8.9–10.3)
Chloride: 94 mmol/L — ABNORMAL LOW (ref 101–111)
GFR calc Af Amer: 44 mL/min — ABNORMAL LOW (ref >60)
GFR, EST NON AFRICAN AMERICAN: 38 mL/min — AB (ref >60)
Glucose, Bld: 107 mg/dL — ABNORMAL HIGH (ref 65–99)
Potassium: 4.1 mmol/L (ref 3.5–5.1)
Sodium: 141 mmol/L (ref 135–145)
TOTAL PROTEIN: 5.9 g/dL — AB (ref 6.5–8.1)

## 2017-06-17 LAB — CBC
HEMATOCRIT: 31.4 % — AB (ref 39.0–52.0)
Hemoglobin: 9.3 g/dL — ABNORMAL LOW (ref 13.0–17.0)
MCH: 28.2 pg (ref 26.0–34.0)
MCHC: 29.6 g/dL — ABNORMAL LOW (ref 30.0–36.0)
MCV: 95.2 fL (ref 78.0–100.0)
Platelets: 155 10*3/uL (ref 150–400)
RBC: 3.3 MIL/uL — AB (ref 4.22–5.81)
RDW: 13.2 % (ref 11.5–15.5)
WBC: 6.4 10*3/uL (ref 4.0–10.5)

## 2017-06-17 LAB — ECHOCARDIOGRAM COMPLETE
Height: 69 in
Weight: 3177.6 oz

## 2017-06-17 LAB — TROPONIN I
TROPONIN I: 0.69 ng/mL — AB (ref <0.03)
Troponin I: 0.72 ng/mL (ref <0.03)

## 2017-06-17 MED ORDER — BUDESONIDE 0.5 MG/2ML IN SUSP
0.5000 mg | Freq: Two times a day (BID) | RESPIRATORY_TRACT | Status: DC
  Administered 2017-06-17 – 2017-06-18 (×2): 0.5 mg via RESPIRATORY_TRACT
  Filled 2017-06-17 (×2): qty 2

## 2017-06-17 MED ORDER — HYDRALAZINE HCL 25 MG PO TABS
25.0000 mg | ORAL_TABLET | Freq: Two times a day (BID) | ORAL | Status: DC
  Administered 2017-06-17 – 2017-06-18 (×3): 25 mg via ORAL
  Filled 2017-06-17 (×3): qty 1

## 2017-06-17 MED ORDER — METHYLPREDNISOLONE SODIUM SUCC 125 MG IJ SOLR
60.0000 mg | Freq: Two times a day (BID) | INTRAMUSCULAR | Status: DC
  Administered 2017-06-17 – 2017-06-18 (×3): 60 mg via INTRAVENOUS
  Filled 2017-06-17 (×3): qty 2

## 2017-06-17 NOTE — Progress Notes (Signed)
*  PRELIMINARY RESULTS* Echocardiogram 2D Echocardiogram has been performed.  Harold Johnson, Harold Johnson J 06/17/2017, 11:58 AM

## 2017-06-17 NOTE — Progress Notes (Signed)
Troponin  0.72  Up from 0.69.  BP  169/78  Pulse 65.  SR with pvc's. Denies chest pain.  Texted Dr. Arbutus Leasat with this information

## 2017-06-17 NOTE — Progress Notes (Signed)
Tele called and QTC between mid 400 to > 500.  SR 62 with pvc's   BP  157/71.  Denies chest pain.,  Texted Dr. Arbutus Leasat this information Patient sitting on side of bed doing nebulizer.  Visitor at bedside.

## 2017-06-17 NOTE — Progress Notes (Signed)
Doctor [place] notified of patient's latest troponin result.  This is the third troponin level result.    Patient sleeping in bed at this time with no complaints.  Will continue to monitor closely.

## 2017-06-17 NOTE — Progress Notes (Signed)
PROGRESS NOTE  Harold Johnson ZOX:[id number] DOB: 05-Feb-1935 DOA: 06/16/2017 PCP: Harold Johnson  Brief History:  83 year old male with a history of chronic respiratory failure on 3 L, hypertension, hyperlipidemia, stroke, COPD, CKD stage III presenting with 1 day history of shortness of breath.  The patient was recently discharged from the hospital after stay from 06/07/17 through 06/08/17 during which she was treated for a COPD exacerbation.  The patient was discharged home with doxycycline and a prednisone taper.  Upon asking the patient about his medications, he seems to have difficulty understanding how to have taken his prednisone taper.  He states that there were days he took it twice a day, and days where he did not take it at all.  He states that he still has numerous pills left in his bottle on the day of admission.  He denies any fevers, chills, chest pain, abdominal pain.  He had one episode of nausea and vomiting on 06/16/2017.  This has improved since admission.  Upon admission, the patient was noted to have elevated troponin.  Cardiology was consulted to assist with management.  Assessment/Plan: COPD exacerbation  -Patient is 45-pack-year historytobacco -Start IV Solu-Medrol -Start Pulmicort -Continue duo nebs -Patient did not have good understanding on how to take his prednisone taper  Chronic diastolic CHF -Appears clinically euvolemic -Discontinue IV Lasix -10/15/2011 echo EF 65-70%, grade 1 DD -Daily weights -Suspected degree of cor pulmonale  Elevated troponin -Secondary to Demand ischemia -no CP -cardiology consulted -Echo  Transaminasemia -?hepatic congestion -hep B surface antigen -hep C antibody -RUQ ultrasound -improving -no abd pain  Chronic respiratory failure with hypoxia -Chronically on 3 L nasal cannula  Essential hypertension -Continue amlodipine,  losartan, metoprolol tartrate -d/c clonidine -start hydralazine  CKD stage  III -Baseline creatinine 1.4-1.8 -Discontinue Relafen   Disposition Plan:   Home 3/14 if cleared by cardiology and stable Family Communication:  No Family at bedside  Consultants:  cardiology  Code Status:  FUL  DVT Prophylaxis:  Harold Johnson Lovenox   Procedures: As Listed in Progress Note Above  Antibiotics: None    Subjective: Patient is breathing better.  He complains of a nonproductive cough.  He denies any fevers, chills, chest pain, shortness breath, nausea, vomiting, diarrhea, abdominal pain, dysuria, hematuria.  Objective: Vitals:   06/16/17 2313 06/17/17 0026 06/17/17 0612 06/17/17 0754  BP:  117/70 (!) 165/85   Pulse:   60   Resp:   18   Temp:   98.5 F (36.9 C)   TempSrc:   Oral   SpO2: 100%  100% 100%  Weight:      Height:        Intake/Output Summary (Last 24 hours) at 06/17/2017 0850 Last data filed at 06/17/2017 0612 Gross per 24 hour  Intake 180 ml  Output 1700 ml  Net -1520 ml   Weight change:  Exam:   General:  Pt is alert, follows commands appropriately, not in acute distress  HEENT: No icterus, No thrush, No neck mass, /AT  Cardiovascular: RRR, S1/S2, no rubs, no gallops  Respiratory: Bibasilar wheeze.  Bibasilar crackles.  No rhonchi  Abdomen: Soft/+BS, non tender, non distended, no guarding  Extremities: No edema, No lymphangitis, No petechiae, No rashes, no synovitis   Data Reviewed: I have personally reviewed following labs and imaging studies Basic Metabolic Panel: Recent Labs  Lab 06/16/17 1349 06/17/17 0417  NA 141 141  K 4.3 4.1  CL 96* 94*  CO2 36* 38*  GLUCOSE 144* 107*  BUN 26* 28*  CREATININE 1.62* 1.63*  CALCIUM 10.5* 10.4*   Liver Function Tests: Recent Labs  Lab 06/16/17 1349 06/17/17 0417  AST 118* 73*  ALT 113* 84*  ALKPHOS 48 43  BILITOT 0.8 0.8  PROT 6.7 5.9*  ALBUMIN 3.5 3.2*   No results for input(s): LIPASE, AMYLASE in the last 168 hours. No results for input(s): AMMONIA in the last 168  hours. Coagulation Profile: No results for input(s): INR, PROTIME in the last 168 hours. CBC: Recent Labs  Lab 06/16/17 1349 06/17/17 0417  WBC 6.5 6.4  NEUTROABS 4.8  --   HGB 9.6* 9.3*  HCT 32.2* 31.4*  MCV 95.8 95.2  PLT 183 155   Cardiac Enzymes: Recent Labs  Lab 06/16/17 1349 06/16/17 1720 06/16/17 2232 06/17/17 0417  TROPONINI 0.08* 0.31* 0.65* 0.69*   BNP: Invalid input(s): POCBNP CBG: No results for input(s): GLUCAP in the last 168 hours. HbA1C: No results for input(s): HGBA1C in the last 72 hours. Urine analysis:    Component Value Date/Time   COLORURINE YELLOW 10/12/2011 1019   APPEARANCEUR CLEAR 10/12/2011 1019   LABSPEC >1.030 (H) 10/12/2011 1019   PHURINE 6.0 10/12/2011 1019   GLUCOSEU NEGATIVE 10/12/2011 1019   HGBUR LARGE (A) 10/12/2011 1019   BILIRUBINUR NEGATIVE 10/12/2011 1019   KETONESUR NEGATIVE 10/12/2011 1019   PROTEINUR 100 (A) 10/12/2011 1019   UROBILINOGEN 0.2 10/12/2011 1019   NITRITE NEGATIVE 10/12/2011 1019   LEUKOCYTESUR NEGATIVE 10/12/2011 1019   Sepsis Labs: @LABRCNTIP (procalcitonin:4,lacticidven:4) )No results found for this or any previous visit (from the past 240 hour(s)).   Scheduled Meds: . amLODipine  10 mg Oral Daily  . aspirin EC  81 mg Oral Daily  . cloNIDine  0.1 mg Oral BID  . enoxaparin (LOVENOX) injection  40 mg Subcutaneous Q24H  . escitalopram  10 mg Oral Daily  . furosemide  20 mg Intravenous Q12H  . gabapentin  300 mg Oral TID  . guaiFENesin  600 mg Oral BID  . ipratropium-albuterol  3 mL Nebulization TID  . losartan  100 mg Oral Daily  . metoprolol tartrate  25 mg Oral BID  . nabumetone  500 mg Oral BID  . sodium chloride flush  3 mL Intravenous Q12H   Continuous Infusions: . sodium chloride      Procedures/Studies: Dg Chest 2 View  Result Date: 06/16/2017 CLINICAL DATA:  Short of breath EXAM: CHEST - 2 VIEW COMPARISON:  06/07/2017 FINDINGS: Heart size upper normal. Pulmonary artery enlargement  similar to the prior study compatible with pulmonary artery hypertension. Negative for heart failure. Negative for infiltrate effusion or mass. IMPRESSION: Findings consistent with pulmonary artery hypertension. No acute cardiopulmonary abnormality. Electronically Signed   By: Marlan Palau M.D.   On: 06/16/2017 14:06   Dg Chest 2 View  Result Date: 06/07/2017 CLINICAL DATA:  Shortness of breath EXAM: CHEST  2 VIEW COMPARISON:  01/16/2017 FINDINGS: Lungs are essentially clear. No focal consolidation. No pleural effusion or pneumothorax. The heart is top-normal in size. Visualized osseous structures are within normal limits. IMPRESSION: No evidence of acute cardiopulmonary disease. Electronically Signed   By: Charline Bills M.D.   On: 06/07/2017 11:27    Catarina Hartshorn, Johnson  [facility] Pager [phone]  If 7PM-7AM, please contact night-coverage [email or web address] Password [email or web address] 06/17/2017, 8:50 AM   LOS: 1 day

## 2017-06-18 ENCOUNTER — Encounter (HOSPITAL_COMMUNITY): Payer: Self-pay | Admitting: Student

## 2017-06-18 DIAGNOSIS — N183 Chronic kidney disease, stage 3 (moderate): Secondary | ICD-10-CM

## 2017-06-18 DIAGNOSIS — J9611 Chronic respiratory failure with hypoxia: Secondary | ICD-10-CM

## 2017-06-18 DIAGNOSIS — I5033 Acute on chronic diastolic (congestive) heart failure: Secondary | ICD-10-CM

## 2017-06-18 DIAGNOSIS — I214 Non-ST elevation (NSTEMI) myocardial infarction: Secondary | ICD-10-CM

## 2017-06-18 DIAGNOSIS — I5031 Acute diastolic (congestive) heart failure: Secondary | ICD-10-CM

## 2017-06-18 DIAGNOSIS — I10 Essential (primary) hypertension: Secondary | ICD-10-CM

## 2017-06-18 LAB — HEPATIC FUNCTION PANEL
ALK PHOS: 50 U/L (ref 38–126)
ALT: 77 U/L — AB (ref 17–63)
AST: 61 U/L — ABNORMAL HIGH (ref 15–41)
Albumin: 3.6 g/dL (ref 3.5–5.0)
Total Bilirubin: 0.8 mg/dL (ref 0.3–1.2)
Total Protein: 6.9 g/dL (ref 6.5–8.1)

## 2017-06-18 LAB — BASIC METABOLIC PANEL
Anion gap: 12 (ref 5–15)
BUN: 32 mg/dL — AB (ref 6–20)
CALCIUM: 10.7 mg/dL — AB (ref 8.9–10.3)
CHLORIDE: 93 mmol/L — AB (ref 101–111)
CO2: 36 mmol/L — AB (ref 22–32)
CREATININE: 1.6 mg/dL — AB (ref 0.61–1.24)
GFR calc non Af Amer: 39 mL/min — ABNORMAL LOW (ref >60)
GFR, EST AFRICAN AMERICAN: 45 mL/min — AB (ref >60)
Glucose, Bld: 132 mg/dL — ABNORMAL HIGH (ref 65–99)
Potassium: 4.4 mmol/L (ref 3.5–5.1)
SODIUM: 141 mmol/L (ref 135–145)

## 2017-06-18 LAB — MAGNESIUM: Magnesium: 1.7 mg/dL (ref 1.7–2.4)

## 2017-06-18 LAB — HEPATITIS B SURFACE ANTIGEN: Hepatitis B Surface Ag: NEGATIVE

## 2017-06-18 LAB — HEPATITIS C ANTIBODY: HCV Ab: <0.1 s/co ratio (ref 0.0–0.9)

## 2017-06-18 MED ORDER — PREDNISONE 20 MG PO TABS: 60.0000 mg | ORAL_TABLET | Freq: Every day | ORAL | Status: DC

## 2017-06-18 MED ORDER — ATORVASTATIN CALCIUM 40 MG PO TABS: 40.0000 mg | ORAL_TABLET | Freq: Every day | ORAL | Status: DC

## 2017-06-18 MED ORDER — HYDRALAZINE HCL 25 MG PO TABS: 25.0000 mg | ORAL_TABLET | Freq: Two times a day (BID) | ORAL | 1 refills | Status: AC

## 2017-06-18 MED ORDER — ORAL CARE MOUTH RINSE
15.0000 mL | Freq: Two times a day (BID) | OROMUCOSAL | Status: DC
  Administered 2017-06-18: 15 mL via OROMUCOSAL

## 2017-06-18 MED ORDER — PREDNISONE 10 MG PO TABS: 60.0000 mg | ORAL_TABLET | Freq: Every day | ORAL | 0 refills | Status: DC

## 2017-06-18 MED ORDER — ATORVASTATIN CALCIUM 10 MG PO TABS: 10.0000 mg | ORAL_TABLET | Freq: Every day | ORAL | Status: DC

## 2017-06-18 MED ORDER — ATORVASTATIN CALCIUM 10 MG PO TABS: 10.0000 mg | ORAL_TABLET | Freq: Every day | ORAL | 1 refills | Status: AC

## 2017-06-18 NOTE — Progress Notes (Signed)
Robb Matarrtiz, MD notified via text page of tele reporting PVC's and pause x2. Both pauses less than 2 seconds. Patient asymptomatic. No response as of yet. Will continue to monitor.

## 2017-06-18 NOTE — Care Management Note (Signed)
Case Management Note  Patient Details  Name: Harold CollaJohn Johnson MRN: [id number] Date of Birth: 1934-09-16     Expected Discharge Date:  06/18/17               Expected Discharge Plan:  Home/Self Care  In-House Referral:     Discharge planning Services  CM Consult  Post Acute Care Choice:  Durable Medical Equipment(O2 tank for transport home) Choice offered to:  Patient  DME Arranged:    DME Agency:  [facility]  HH [facility]    [facility] Agency:     Status of Service:  Completed, signed off  If discussed at Long Length of Stay Meetings, dates discussed:    Additional Comments: Patient discharging home today. From home with wife. Ind with ADL's. Has home oxygen.Still drives.  Declines HH.  Inas Avena, Chrystine OilerSharley Diane, RN 06/18/2017, 1:31 PM

## 2017-06-18 NOTE — Consult Note (Addendum)
Cardiology Consult    Patient ID: Harold Johnson; [id number]; May 19, 1934   Admit date: 06/16/2017 Date of Consult: 06/18/2017  Primary Care Provider:  JesterButler, Cynthia, DO Consulting Cardiologist: [facility] Dr. Diona BrownerMcDowell   Patient Profile    Harold Johnson is a 83 y.o. male with past medical history of chronic hypoxic respiratory failure (on 3L [place] at baseline), chronic diastolic CHF, HTN, HLD, Stage 3 CKD, and COPD who is being seen today for the evaluation of CHF and elevated troponin values at the request of Dr. Arbutus Leasat.   History of Present Illness    Mr. Harold Johnson was recently admitted at [facility] from 3/3 - 06/08/2017 for treatment of a CHF exacerbation.  He was found to have an acute kidney injury during admission (creatinine peaked at 2.23), therefore HCTZ along with Losartan were discontinued.   [facility] for evaluation of worsening dyspnea on exertion over the past several days. In talking with the patient today, he reports having baseline dyspnea on exertion but had experienced acute exacerbation of his symptoms with associated orthopnea and lower extremity edema. He reports having been prescribed a steroid taper at the time of recent hospital discharge but was confused on how to take this and was only taking 1 tablet daily. He denies any recent chest pain, palpitations, dizziness, or presyncope.  He denies any prior cardiac history. Does have known HTN and HLD. Reports previously being on statin therapy but it appears this was discontinued by his PCP secondary to elevated LFT's.  Reports his father passed away from a "enlarged heart" at the age of 81. The patient is a former smoker, having quit 20+ years ago.   Initial labs showed WBC 6.5, Hgb 9.6, platelets 183, Na+ 141, K+ 4.3, and creatinine 1.62 (Baseline 1.4 - 1.5). BNP 603. Initial troponin was elevated at 0.08 with repeat values of 0.31, 0.65, 0.69, and 0.72. EKG shows NSR, HR 75, with PVC's and  nonspecific ST abnormalities along inferior and lateral leads. CXR showed findings consistent with pulmonary artery hypertension and no acute cardiopulmonary abnormalities. An echocardiogram was obtained and showed a preserved EF of 55-60%, grade 1 diastolic dysfunction, trivial MR, mild TR, and moderate PR.  He reports his respiratory status has significantly improved since admission and is at baseline. Is anxious to go home later today if possible.   Past Medical History:  Diagnosis Date  . Acute respiratory failure with hypercapnia (HCC) 10/2011   Status post intubation and ventilation.  . Anemia 10/12/2011  . Chronic back pain   . Chronic respiratory failure with hypoxia (HCC)   . Chronic respiratory failure with hypoxia (HCC)   . COPD (chronic obstructive pulmonary disease) (HCC)    Oxygen-dependent. C02 retainer  . Elevated CK 10/13/2011   Possibly statin-induced rhabdomyolysis.  . Hypercholesteremia   . Hypertension   . Pneumonia   . Right axis deviation   . Stroke [facility](HCC)     Past Surgical History:  Procedure Laterality Date  . fatty tumor     Excision from left shoulder.  . INGUINAL HERNIA REPAIR       Home Medications:  Prior to Admission medications   Medication Sig Start Date End Date Taking? Authorizing Provider  albuterol (PROVENTIL) (2.5 MG/3ML) 0.083% nebulizer solution Take 3 mLs (2.5 mg total) by nebulization every 6 (six) hours as needed for wheezing or shortness of breath. 06/08/17  Yes Erick BlinksMemon, Jehanzeb, MD  amLODipine (NORVASC) 10 MG tablet Take 10  mg by mouth daily.    Yes [provider]  aspirin EC 81 MG tablet Take 81 mg by mouth daily.   Yes [provider]  cloNIDine (CATAPRES) 0.1 MG tablet Take 0.1 mg by mouth at bedtime.   Yes [provider]  doxycycline (VIBRA-TABS) 100 MG tablet Take 1 tablet (100 mg total) by mouth every 12 (twelve) hours. 06/08/17  Yes Erick Blinks, MD  escitalopram (LEXAPRO) 10 MG tablet Take 10 mg by mouth  daily.   Yes [provider]  gabapentin (NEURONTIN) 300 MG capsule Take 1 capsule (300 mg total) by mouth at bedtime. Patient taking differently: Take 300 mg by mouth 3 (three) times daily.  10/24/11  Yes Elliot Cousin, MD  guaiFENesin (MUCINEX) 600 MG 12 hr tablet Take 1 tablet (600 mg total) by mouth 2 (two) times daily. 06/08/17  Yes Erick Blinks, MD  losartan-hydrochlorothiazide (HYZAAR) 100-25 MG tablet Take 1 tablet by mouth daily.   Yes [provider]  metoprolol tartrate (LOPRESSOR) 25 MG tablet Take 1 tablet (25 mg total) by mouth 2 (two) times daily. 06/08/17 06/08/18 Yes Erick Blinks, MD  nabumetone (RELAFEN) 500 MG tablet Take 500 mg by mouth 2 (two) times daily.   Yes [provider]  OXYGEN Inhale 2-3 L into the lungs daily.   Yes [provider]  polyethylene glycol (MIRALAX / GLYCOLAX) packet Take 17 g by mouth daily as needed for mild constipation. 06/08/17  Yes Erick Blinks, MD  predniSONE (DELTASONE) 10 MG tablet Take 40mg  po daily for 2 days then 30mg  daily for 2 days then 20mg  daily for 2 days then 10mg  daily for 2 days then stop 06/08/17  Yes Memon, Durward Mallard, MD  Tiotropium Bromide-Olodaterol (STIOLTO RESPIMAT) 2.5-2.5 MCG/ACT AERS Inhale 2 puffs into the lungs daily.   Yes [provider]  VENTOLIN HFA 108 (90 Base) MCG/ACT inhaler Inhale 2 puffs into the lungs every 4 (four) hours as needed for wheezing.  12/26/16  Yes [provider]    Inpatient Medications: Scheduled Meds: . amLODipine  10 mg Oral Daily  . aspirin EC  81 mg Oral Daily  . budesonide (PULMICORT) nebulizer solution  0.5 mg Nebulization BID  . enoxaparin (LOVENOX) injection  40 mg Subcutaneous Q24H  . escitalopram  10 mg Oral Daily  . gabapentin  300 mg Oral TID  . guaiFENesin  600 mg Oral BID  . hydrALAZINE  25 mg Oral BID  . ipratropium-albuterol  3 mL Nebulization TID  . losartan  100 mg Oral Daily  . mouth rinse  15 mL Mouth Rinse BID  .  methylPREDNISolone (SOLU-MEDROL) injection  60 mg Intravenous Q12H  . metoprolol tartrate  25 mg Oral BID  . sodium chloride flush  3 mL Intravenous Q12H   Continuous Infusions: . sodium chloride     PRN Meds: sodium chloride, albuterol, ondansetron **OR** ondansetron (ZOFRAN) IV, polyethylene glycol, sodium chloride flush  Allergies:   No Known Allergies  Social History:   Social History   Socioeconomic History  . Marital status: Married    Spouse name: Not on file  . Number of children: Not on file  . Years of education: Not on file  . Highest education level: Not on file  Social Needs  . Financial resource strain: Not on file  . Food insecurity - worry: Not on file  . Food insecurity - inability: Not on file  . Transportation needs - medical: Not on file  . Transportation needs -  non-medical: Not on file  Occupational History  . Not on file  Tobacco Use  . Smoking status: Former Smoker    Packs/day: 1.00    Years: 25.00    Pack years: 25.00    Last attempt to quit: 06/18/1996    Years since quitting: 21.0  . Smokeless tobacco: Never Used  Substance and Sexual Activity  . Alcohol use: No    Comment: used to  . Drug use: No  . Sexual activity: Not on file  Other Topics Concern  . Not on file  Social History Narrative  . Not on file     Family History:    Family History  Problem Relation Age of Onset  . Cardiomyopathy Father   . Hypertension Father       Review of Systems    General:  No chills, fever, night sweats or weight changes.  Cardiovascular:  No chest pain, palpitations, paroxysmal nocturnal dyspnea. Positive for orthopnea, lower extremity edema, and dyspnea on exertion.  Dermatological: No rash, lesions/masses Respiratory: No cough, Positive for dyspnea. Urologic: No hematuria, dysuria Abdominal:   No nausea, vomiting, diarrhea, bright red blood per rectum, melena, or hematemesis Neurologic:  No visual changes, wkns, changes in mental  status. All other systems reviewed and are otherwise negative except as noted above.  Physical Exam/Data    Vitals:   06/17/17 2136 06/18/17 0559 06/18/17 0727 06/18/17 0731  BP: (!) 142/74 132/74    Pulse: 66 64    Resp: 20 20    Temp: 98 F (36.7 C) 98.1 F (36.7 C)    TempSrc: Oral Oral    SpO2: 97% 96% 99% 99%  Weight:  193 lb 12.6 oz (87.9 kg)    Height:        Intake/Output Summary (Last 24 hours) at 06/18/2017 0837 Last data filed at 06/18/2017 0700 Gross per 24 hour  Intake 606 ml  Output 600 ml  Net 6 ml   Filed Weights   06/16/17 2124 06/18/17 0559  Weight: 198 lb 9.6 oz (90.1 kg) 193 lb 12.6 oz (87.9 kg)   Body mass index is 28.62 kg/m.   General: Pleasant, elderly African American male appearing in NAD Psych: Normal affect. Neuro: Alert and oriented X 3. Moves all extremities spontaneously. HEENT: Normal  Neck: Supple without bruits or JVD. Lungs:  Resp regular and unlabored, CTA without wheezing or rales. Heart: RRR no s3, s4, or murmurs. Abdomen: Soft, non-tender, non-distended, BS + x 4.  Extremities: No clubbing, cyanosis or edema. DP/PT/Radials 2+ and equal bilaterally.   EKG:  The EKG was personally reviewed and demonstrates: NSR, HR 75, with PVC's and nonspecific ST abnormalities along inferior and lateral leads.   Labs/Studies     Relevant CV Studies:  Echocardiogram: 06/17/2017 Study Conclusions  - Left ventricle: The cavity size was normal. Wall thickness was   increased increased in a pattern of mild to moderate LVH.   Systolic function was normal. The estimated ejection fraction was   in the range of 55% to 60%. Wall motion was normal; there were no   regional wall motion abnormalities. Doppler parameters are   consistent with abnormal left ventricular relaxation (grade 1   diastolic dysfunction). - Aortic valve: Trileaflet; mildly calcified leaflets. - Mitral valve: There was trivial regurgitation. - Left atrium: The atrium was  mildly dilated. - Right ventricle: The cavity size was mildly dilated. Systolic   function was mildly reduced. - Right atrium: The atrium was mildly dilated. Central  venous   pressure (est): 3 mm Hg. - Tricuspid valve: There was mild regurgitation. - Pulmonic valve: There was moderate regurgitation. - Pulmonary arteries: Systolic pressure was moderately increased.   PA peak pressure: 54 mm Hg (S). - Pericardium, extracardiac: There was no pericardial effusion.  Laboratory Data:  Chemistry Recent Labs  Lab 06/16/17 1349 06/17/17 0417 06/18/17 0357  NA 141 141 141  K 4.3 4.1 4.4  CL 96* 94* 93*  CO2 36* 38* 36*  GLUCOSE 144* 107* 132*  BUN 26* 28* 32*  CREATININE 1.62* 1.63* 1.60*  CALCIUM 10.5* 10.4* 10.7*  GFRNONAA 38* 38* 39*  GFRAA 45* 44* 45*  ANIONGAP 9 9 12     Recent Labs  Lab 06/16/17 1349 06/17/17 0417 06/18/17 0357  PROT 6.7 5.9* 6.9  ALBUMIN 3.5 3.2* 3.6  AST 118* 73* 61*  ALT 113* 84* 77*  ALKPHOS 48 43 50  BILITOT 0.8 0.8 PENDING   Hematology Recent Labs  Lab 06/16/17 1349 06/17/17 0417  WBC 6.5 6.4  RBC 3.36* 3.30*  HGB 9.6* 9.3*  HCT 32.2* 31.4*  MCV 95.8 95.2  MCH 28.6 28.2  MCHC 29.8* 29.6*  RDW 13.9 13.2  PLT 183 155   Cardiac Enzymes Recent Labs  Lab 06/16/17 1349 06/16/17 1720 06/16/17 2232 06/17/17 0417 06/17/17 0938  TROPONINI 0.08* 0.31* 0.65* 0.69* 0.72*   No results for input(s): TROPIPOC in the last 168 hours.  BNP Recent Labs  Lab 06/16/17 1349  BNP 603.0*    DDimer No results for input(s): DDIMER in the last 168 hours.  Radiology/Studies:  Dg Chest 2 View  Result Date: 06/16/2017 CLINICAL DATA:  Short of breath EXAM: CHEST - 2 VIEW COMPARISON:  06/07/2017 FINDINGS: Heart size upper normal. Pulmonary artery enlargement similar to the prior study compatible with pulmonary artery hypertension. Negative for heart failure. Negative for infiltrate effusion or mass. IMPRESSION: Findings consistent with pulmonary artery  hypertension. No acute cardiopulmonary abnormality. Electronically Signed   By: Marlan Palau M.D.   On: 06/16/2017 14:06   US Abdomen Limited Ruq  Result Date: 06/17/2017 CLINICAL DATA:  Unspecified disorder of liver function. Transaminasemia. EXAM: ULTRASOUND ABDOMEN LIMITED RIGHT UPPER QUADRANT COMPARISON:  None. FINDINGS: Gallbladder: No gallstones or wall thickening visualized. The gallbladder is contracted in appearance. No sonographic Murphy sign noted by sonographer. Common bile duct: Diameter: 2.5 mm Liver: Anechoic well-circumscribed cyst in the right hepatic lobe measuring 1.3 cm in diameter. No solid-appearing masses. No biliary dilatation is identified. Echogenic liver parenchyma. Portal vein is patent on color Doppler imaging with normal direction of blood flow towards the liver. IMPRESSION: 1. Anechoic simple appearing right hepatic cyst measuring 1.3 cm diameter. 2. Mild diffuse increase in echogenicity of the liver parenchyma is noted compatible with fatty infiltration. Electronically Signed   By: Tollie Eth M.D.   On: 06/17/2017 14:09     Assessment & Plan    1. Elevated Troponin  - admitted with worsening dyspnea on exertion and orthopnea, likely multifactorial in the setting of his chronic hypoxic respiratory failure, COPD exacerbation, and CHF exacerbation.  - cyclic troponin values found to be elevated to 0.08 with repeat values of 0.31, 0.65, 0.69, and 0.72. Initial EKG shows NSR, HR 75, with PVC's and nonspecific ST abnormalities along inferior and lateral leads. On telemetry overnight, he was noted to have pauses up to 1.67 seconds. Noted as having a prolonged QTc at times but it appears it remained less than 500 ms and this misnomer occurred at the  time of his PVC's. Will obtain a repeat EKG this AM for further assessment.  - he denies any recent chest pain. Does have baseline dyspnea on exertion. Echo shows a preserved EF of 55-60%, Grade 1 DD, trivial MR, mild TR, and  moderate PR. His enzyme elevation is likely secondary to demand ischemia in the setting of his acute on chronic hypoxic respiratory failure but he does have multiple cardiac risk factors including HTN, HLD, family history of CAD, and prior tobacco use. We discussed further ischemic evaluation with stress testing initially but he declines further testing at this time and already consumed breakfast today. Discussed outpatient testing and he remains undecided on this. Would further address as an outpatient. Would not pursue a cardiac catheterization unless high-risk given his variable CKD.   2. Acute on Chronic Diastolic CHF - BNP elevated to 603 on admission with CXR showing findings consistent with pulmonary artery hypertension and no acute cardiopulmonary abnormalities. - echo shows a preserved EF of 55-60%, grade 1 diastolic dysfunction, trivial MR, mild TR, and moderate PR. PA peak pressure elevated to 54 mm Hg.  - he appears euvolemic on examination. Previously on HCTZ prior to last admission but this was discontinued in the setting of AKI. We reviewed sodium restriction and following daily weights at home. May need to consider the use of PRN Lasix as an outpatient if volume status remains an issue.   3. HTN - BP has been variable at 132/71- 169/78 within the past 24 hours. Stable at 132/74 on most recent check. - continue Amlodipine 10mg  daily, Losartan 100mg  daily, Lopressor 25mg  BID, and newly started Hydralazine. Can further titrate Hydralazine if additional BP control is needed.   4. COPD Exacerbation/ Chronic Hypoxic Respiratory Failure - on 3L . Remains on IV steroids and nebulizer treatments. Reports breathing is back to baseline. - per admitting team.  5. Stage 3 CKD - creatinine elevated to 1.62 on admission (Baseline 1.4 - 1.5). Stable at 1.60 this morning.   For questions or updates, please contact [facility] Please consult [email or web address] for contact info under  Cardiology/STEMI.  Signed, Ellsworth Lennox, PA-C 06/18/2017, 8:37 AM Pager: [phone]   Attending note:  Patient seen and examined.  I reviewed records and discussed the case with Ms. Iran Ouch PA-C.  Patient presents with recent worsening shortness of breath and orthopnea in the setting of chronic hypoxic respiratory failure with COPD exacerbation as well as acute on chronic diastolic heart failure.  No pulmonary edema by chest x-ray.  He did have abnormal troponin I levels peaking at 0.72, BNP 603.  He has been treated with steroid taper recently as outpatient, but not taking it as directed.  Does not report frank chest pain.  Echocardiogram obtained yesterday revealed mild to moderate LVH with LVEF 55-60%, no regional wall motion abnormalities, grade 1 diastolic dysfunction, also mildly dilated and hypocontractile right ventricle with pulmonary hypertension, PASP 54 mmHg.  On examination this morning he is lying supine in bed, not short of breath at rest or complaining of chest pain.  Systolic blood pressures in the 130s-140s with heart rate in the 60s in sinus rhythm with frequent PACs by telemetry which I personally reviewed.  Lungs exhibit decreased breath sounds with prolonged expiratory phase.  Cardiac exam reveals RRR with ectopy and soft systolic murmur without gallop.  Lab work shows potassium 4.4, BUN 32, creatinine 1.6, peak troponin I 0.72, hemoglobin 9.3.  I personally reviewed his follow-up ECG this morning which shows sinus rhythm  with marked anterolateral T wave inversions concerning for ischemic change.  Patient presents with recent increased shortness of breath, possibly multifactorial in light of chronic medical conditions, however his follow-up ECG this morning is substantially abnormal suggesting anterolateral ischemic change.  Troponin I level peaked at 0.72.  His echocardiogram did not demonstrate cardiomyopathy or focal wall motion abnormalities however the concern for  underlying ischemic heart disease still remains.  We have discussed with him considering further ischemic evaluation including cardiac catheterization, at this point he declines further testing.  I have asked him to reconsider this as his risk for adverse cardiac complications remains high.  Currently on aspirin, beta-blocker, and Lovenox.  Check FLP and add empiric statin.  Jonelle Sidle, M.D., F.A.C.C.

## 2017-06-18 NOTE — Discharge Instructions (Signed)
°  See primary doctor in 1-2 weeks and have labs:  CBC   BMP

## 2017-06-18 NOTE — Discharge Summary (Signed)
Physician Discharge Summary  Harold Johnson ZOX:[id number] DOB: 1934/11/20 DOA: 06/16/2017  PCP: Samuel Jester, DO  Admit date: 06/16/2017 Discharge date: 06/18/2017  Admitted From: Home Disposition:  Home  Recommendations for Outpatient Follow-up:  1. Follow up with PCP in 1-2 weeks 2. Please obtain BMP/CBC in one week     Discharge Condition: Stable CODE STATUS: FULL Diet recommendation: Heart Healthy   Brief/Interim Summary: 83 year old male with a history of chronic respiratory failure on 3 L, hypertension, hyperlipidemia, stroke, COPD, CKD stage III presenting with 1 day history of shortness of breath.  The patient was recently discharged from the hospital after stay from 06/07/17 through 06/08/17 during which she was treated for a COPD exacerbation.  The patient was discharged home with doxycycline and a prednisone taper.  Upon asking the patient about his medications, he seems to have difficulty understanding how to have taken his prednisone taper.  He states that there were days he took it twice a day, and days where he did not take it at all.  He states that he still has numerous pills left in his bottle on the day of admission.  He denies any fevers, chills, chest pain, abdominal pain.  He had one episode of nausea and vomiting on 06/16/2017.  This has improved since admission.  Upon admission, the patient was noted to have elevated troponin.  Cardiology was consulted to assist with management.    Discharge Diagnoses:  COPD exacerbation  -Patient is 45-pack-year historytobacco -Start IV Solu-Medrol>>>prednisone taper -Started Pulmicort -Continue duo nebs -Patient did not have good understanding on how to take his prednisone taper previously -I have repeated to patient how to take his prednisone taper  Acute on Chronic diastolic CHF -Appears clinically euvolemic -Discontinue IV Lasix--clinically improved after 2 doses -10/15/2011 echo EF 65-70%, grade 1 DD -Daily  weights--NEG 5 lbs -NEG 1.5L -Suspected degree of cor pulmonale  Elevated troponin -Secondary to Demand ischemia -no CP -cardiology consult appreciated -Echo EF--55-60%, no WMA, grade 1 DD, PASP 54, mild TR -3/14 EKG--diffuse T wave depression--concerned about ischemic disease -cardiology offered heart catheterization during this admission--pt refuses-->home with BB and statin  Transaminasemia -?hepatic congestion -hep B surface antigen--neg -hep C antibody--neg -RUQ ultrasound--1.3 cm cyst, fatty liver -improving -no abd pain  Chronic respiratory failure with hypoxia -Chronically on 3 L nasal cannula  Essential hypertension -Continue amlodipine,  losartan, metoprolol tartrate -d/c clonidine -start hydralazine  CKD stage III -Baseline creatinine 1.4-1.8 -Discontinue Relafen -serum creatinine 1.60 on day of d/c        Discharge Instructions  Discharge Instructions    Diet - low sodium heart healthy   Complete by:  As directed    Increase activity slowly   Complete by:  As directed      Allergies as of 06/18/2017   No Known Allergies     Medication List    STOP taking these medications   cloNIDine 0.1 MG tablet Commonly known as:  CATAPRES   doxycycline 100 MG tablet Commonly known as:  VIBRA-TABS   nabumetone 500 MG tablet Commonly known as:  RELAFEN     TAKE these medications   amLODipine 10 MG tablet Commonly known as:  NORVASC Take 10 mg by mouth daily.   aspirin EC 81 MG tablet Take 81 mg by mouth daily.   atorvastatin 10 MG tablet Commonly known as:  LIPITOR Take 1 tablet (10 mg total) by mouth daily at 6 PM.   escitalopram 10 MG tablet Commonly known as:  LEXAPRO  Take 10 mg by mouth daily.   gabapentin 300 MG capsule Commonly known as:  NEURONTIN Take 1 capsule (300 mg total) by mouth at bedtime. What changed:  when to take this   guaiFENesin 600 MG 12 hr tablet Commonly known as:  MUCINEX Take 1 tablet (600 mg total)  by mouth 2 (two) times daily.   hydrALAZINE 25 MG tablet Commonly known as:  APRESOLINE Take 1 tablet (25 mg total) by mouth 2 (two) times daily.   losartan-hydrochlorothiazide 100-25 MG tablet Commonly known as:  HYZAAR Take 1 tablet by mouth daily.   metoprolol tartrate 25 MG tablet Commonly known as:  LOPRESSOR Take 1 tablet (25 mg total) by mouth 2 (two) times daily.   OXYGEN Inhale 2-3 L into the lungs daily.   polyethylene glycol packet Commonly known as:  MIRALAX / GLYCOLAX Take 17 g by mouth daily as needed for mild constipation.   predniSONE 10 MG tablet Commonly known as:  DELTASONE Take 6 tablets (60 mg total) by mouth daily with breakfast. And decrease by one tablet daily Start taking on:  06/19/2017 What changed:    how much to take  how to take this  when to take this  additional instructions   STIOLTO RESPIMAT 2.5-2.5 MCG/ACT Aers Generic drug:  Tiotropium Bromide-Olodaterol Inhale 2 puffs into the lungs daily.   VENTOLIN HFA 108 (90 Base) MCG/ACT inhaler Generic drug:  albuterol Inhale 2 puffs into the lungs every 4 (four) hours as needed for wheezing.   albuterol (2.5 MG/3ML) 0.083% nebulizer solution Commonly known as:  PROVENTIL Take 3 mLs (2.5 mg total) by nebulization every 6 (six) hours as needed for wheezing or shortness of breath.      Follow-up Information    Ellsworth Lennox, PA-C Follow up on 07/07/2017.   Specialties:  Physician Assistant, Cardiology Why:  [facility] Follow-up on 07/07/2017 at 3:00PM.  Contact information: [address] [place] [address] [phone]          No Known Allergies  Consultations:  cardiology   Procedures/Studies: Dg Chest 2 View  Result Date: 06/16/2017 CLINICAL DATA:  Short of breath EXAM: CHEST - 2 VIEW COMPARISON:  06/07/2017 FINDINGS: Heart size upper normal. Pulmonary artery enlargement similar to the prior study compatible with pulmonary artery hypertension. Negative for  heart failure. Negative for infiltrate effusion or mass. IMPRESSION: Findings consistent with pulmonary artery hypertension. No acute cardiopulmonary abnormality. Electronically Signed   By: Marlan Palau M.D.   On: 06/16/2017 14:06   Dg Chest 2 View  Result Date: 06/07/2017 CLINICAL DATA:  Shortness of breath EXAM: CHEST  2 VIEW COMPARISON:  01/16/2017 FINDINGS: Lungs are essentially clear. No focal consolidation. No pleural effusion or pneumothorax. The heart is top-normal in size. Visualized osseous structures are within normal limits. IMPRESSION: No evidence of acute cardiopulmonary disease. Electronically Signed   By: Charline Bills M.D.   On: 06/07/2017 11:27   US Abdomen Limited Ruq  Result Date: 06/17/2017 CLINICAL DATA:  Unspecified disorder of liver function. Transaminasemia. EXAM: ULTRASOUND ABDOMEN LIMITED RIGHT UPPER QUADRANT COMPARISON:  None. FINDINGS: Gallbladder: No gallstones or wall thickening visualized. The gallbladder is contracted in appearance. No sonographic Murphy sign noted by sonographer. Common bile duct: Diameter: 2.5 mm Liver: Anechoic well-circumscribed cyst in the right hepatic lobe measuring 1.3 cm in diameter. No solid-appearing masses. No biliary dilatation is identified. Echogenic liver parenchyma. Portal vein is patent on color Doppler imaging with normal direction of blood flow towards the liver.  IMPRESSION: 1. Anechoic simple appearing right hepatic cyst measuring 1.3 cm diameter. 2. Mild diffuse increase in echogenicity of the liver parenchyma is noted compatible with fatty infiltration. Electronically Signed   By: Tollie Ethavid  Kwon M.D.   On: 06/17/2017 14:09        Discharge Exam: Vitals:   06/18/17 0727 06/18/17 0731  BP:    Pulse:    Resp:    Temp:    SpO2: 99% 99%   Vitals:   06/17/17 2136 06/18/17 0559 06/18/17 0727 06/18/17 0731  BP: (!) 142/74 132/74    Pulse: 66 64    Resp: 20 20    Temp: 98 F (36.7 C) 98.1 F (36.7 C)    TempSrc: Oral  Oral    SpO2: 97% 96% 99% 99%  Weight:  87.9 kg (193 lb 12.6 oz)    Height:        General: Pt is alert, awake, not in acute distress Cardiovascular: RRR, S1/S2 +, no rubs, no gallops Respiratory: CTA bilaterally, no wheezing, no rhonchi Abdominal: Soft, NT, ND, bowel sounds + Extremities: no edema, no cyanosis   The results of significant diagnostics from this hospitalization (including imaging, microbiology, ancillary and laboratory) are listed below for reference.    Significant Diagnostic Studies: Dg Chest 2 View  Result Date: 06/16/2017 CLINICAL DATA:  Short of breath EXAM: CHEST - 2 VIEW COMPARISON:  06/07/2017 FINDINGS: Heart size upper normal. Pulmonary artery enlargement similar to the prior study compatible with pulmonary artery hypertension. Negative for heart failure. Negative for infiltrate effusion or mass. IMPRESSION: Findings consistent with pulmonary artery hypertension. No acute cardiopulmonary abnormality. Electronically Signed   By: Marlan Palauharles  Clark M.D.   On: 06/16/2017 14:06   Dg Chest 2 View  Result Date: 06/07/2017 CLINICAL DATA:  Shortness of breath EXAM: CHEST  2 VIEW COMPARISON:  01/16/2017 FINDINGS: Lungs are essentially clear. No focal consolidation. No pleural effusion or pneumothorax. The heart is top-normal in size. Visualized osseous structures are within normal limits. IMPRESSION: No evidence of acute cardiopulmonary disease. Electronically Signed   By: Charline BillsSriyesh  Krishnan M.D.   On: 06/07/2017 11:27   [place] Abdomen Limited Ruq  Result Date: 06/17/2017 CLINICAL DATA:  Unspecified disorder of liver function. Transaminasemia. EXAM: ULTRASOUND ABDOMEN LIMITED RIGHT UPPER QUADRANT COMPARISON:  None. FINDINGS: Gallbladder: No gallstones or wall thickening visualized. The gallbladder is contracted in appearance. No sonographic Murphy sign noted by sonographer. Common bile duct: Diameter: 2.5 mm Liver: Anechoic well-circumscribed cyst in the right hepatic lobe measuring  1.3 cm in diameter. No solid-appearing masses. No biliary dilatation is identified. Echogenic liver parenchyma. Portal vein is patent on color Doppler imaging with normal direction of blood flow towards the liver. IMPRESSION: 1. Anechoic simple appearing right hepatic cyst measuring 1.3 cm diameter. 2. Mild diffuse increase in echogenicity of the liver parenchyma is noted compatible with fatty infiltration. Electronically Signed   By: Tollie Ethavid  Kwon M.D.   On: 06/17/2017 14:09     Microbiology: No results found for this or any previous visit (from the past 240 hour(s)).   Labs: Basic Metabolic Panel: Recent Labs  Lab 06/16/17 1349 06/17/17 0417 06/18/17 0357  NA 141 141 141  K 4.3 4.1 4.4  CL 96* 94* 93*  CO2 36* 38* 36*  GLUCOSE 144* 107* 132*  BUN 26* 28* 32*  CREATININE 1.62* 1.63* 1.60*  CALCIUM 10.5* 10.4* 10.7*  MG  --   --  1.7   Liver Function Tests: Recent Labs  Lab 06/16/17 1349  06/17/17 0417 06/18/17 0357  AST 118* 73* 61*  ALT 113* 84* 77*  ALKPHOS 48 43 50  BILITOT 0.8 0.8 0.8  PROT 6.7 5.9* 6.9  ALBUMIN 3.5 3.2* 3.6   No results for input(s): LIPASE, AMYLASE in the last 168 hours. No results for input(s): AMMONIA in the last 168 hours. CBC: Recent Labs  Lab 06/16/17 1349 06/17/17 0417  WBC 6.5 6.4  NEUTROABS 4.8  --   HGB 9.6* 9.3*  HCT 32.2* 31.4*  MCV 95.8 95.2  PLT 183 155   Cardiac Enzymes: Recent Labs  Lab 06/16/17 1349 06/16/17 1720 06/16/17 2232 06/17/17 0417 06/17/17 0938  TROPONINI 0.08* 0.31* 0.65* 0.69* 0.72*   BNP: Invalid input(s): POCBNP CBG: No results for input(s): GLUCAP in the last 168 hours.  Time coordinating discharge:  Greater than 30 minutes  Signed:  Catarina Hartshorn, DO [facility] Pager: [phone] 06/18/2017, 12:51 PM

## 2017-06-18 NOTE — Progress Notes (Signed)
    Reviewed with the patient, his sister, and sister-in-law his recent abnormal EKG results. We discussed that his EKG and abnormal troponin enzymes are very concerning for progressive CAD. The patient declines further evaluation at this time and is aware of the risks associated with this. He is open to following up as an outpatient and considering further ischemic evaluation then.  Would continue with medical therapy including ASA and beta-blocker. Will restart Atorvastatin at 10mg  daily (lower dose due to history of transaminitis with statins in the past by his report). Will need repeat FLP and LFT's in 6 weeks.   Signed, Ellsworth LennoxBrittany M Kritika Stukes, PA-C 06/18/2017, 11:45 AM Pager: [phone]

## 2017-06-18 NOTE — Care Management Important Message (Signed)
Important Message  Patient Details  Name: Harold Johnson MRN: [id number] Date of Birth: 19-Dec-1934   Medicare Important Message Given:  Yes    Carynn Felling, Chrystine OilerSharley Diane, RN 06/18/2017, 1:36 PM

## 2017-06-18 NOTE — Progress Notes (Signed)
IV removed and follow up appts made and to call primary for appt with lab work.  Scripts sent to pharmacy.  Wife to drive home

## 2017-06-18 NOTE — Progress Notes (Signed)
**Note De-Identified [name] Obfuscation** EKG complete; resulted to Dr. Diona BrownerMcDowell

## 2017-07-02 ENCOUNTER — Emergency Department (HOSPITAL_COMMUNITY)
Admission: EM | Admit: 2017-07-02 | Discharge: 2017-07-02 | Disposition: A | Discharge status: Home / Self Care | Payer: Medicare HMO | Attending: Emergency Medicine | Admitting: Emergency Medicine

## 2017-07-02 ENCOUNTER — Emergency Department (HOSPITAL_COMMUNITY): Payer: Medicare HMO

## 2017-07-02 ENCOUNTER — Encounter (HOSPITAL_COMMUNITY): Payer: Self-pay | Admitting: Emergency Medicine

## 2017-07-02 DIAGNOSIS — I130 Hypertensive heart and chronic kidney disease with heart failure and stage 1 through stage 4 chronic kidney disease, or unspecified chronic kidney disease: Secondary | ICD-10-CM | POA: Insufficient documentation

## 2017-07-02 DIAGNOSIS — Z79899 Other long term (current) drug therapy: Secondary | ICD-10-CM | POA: Insufficient documentation

## 2017-07-02 DIAGNOSIS — I5033 Acute on chronic diastolic (congestive) heart failure: Secondary | ICD-10-CM | POA: Insufficient documentation

## 2017-07-02 DIAGNOSIS — N183 Chronic kidney disease, stage 3 (moderate): Secondary | ICD-10-CM | POA: Diagnosis not present

## 2017-07-02 DIAGNOSIS — Z87891 Personal history of nicotine dependence: Secondary | ICD-10-CM | POA: Diagnosis not present

## 2017-07-02 DIAGNOSIS — Z7982 Long term (current) use of aspirin: Secondary | ICD-10-CM | POA: Insufficient documentation

## 2017-07-02 DIAGNOSIS — J449 Chronic obstructive pulmonary disease, unspecified: Secondary | ICD-10-CM | POA: Diagnosis not present

## 2017-07-02 DIAGNOSIS — R0609 Other forms of dyspnea: Secondary | ICD-10-CM | POA: Insufficient documentation

## 2017-07-02 LAB — BLOOD GAS, VENOUS
ACID-BASE EXCESS: 10 mmol/L — AB (ref 0.0–2.0)
BICARBONATE: 32.9 mmol/L — AB (ref 20.0–28.0)
O2 Content: 2 L/min
O2 SAT: 98.9 %
pCO2, Ven: 65.7 mmHg — ABNORMAL HIGH (ref 44.0–60.0)
pH, Ven: 7.355 (ref 7.250–7.430)
pO2, Ven: 143 mmHg — ABNORMAL HIGH (ref 32.0–45.0)

## 2017-07-02 LAB — CBC WITH DIFFERENTIAL/PLATELET
BASOS PCT: 0 %
Basophils Absolute: 0 10*3/uL (ref 0.0–0.1)
EOS ABS: 0.2 10*3/uL (ref 0.0–0.7)
EOS PCT: 3 %
HCT: 30.9 % — ABNORMAL LOW (ref 39.0–52.0)
HEMOGLOBIN: 9.2 g/dL — AB (ref 13.0–17.0)
Lymphocytes Relative: 17 %
Lymphs Abs: 1.1 10*3/uL (ref 0.7–4.0)
MCH: 28.8 pg (ref 26.0–34.0)
MCHC: 29.8 g/dL — ABNORMAL LOW (ref 30.0–36.0)
MCV: 96.9 fL (ref 78.0–100.0)
MONO ABS: 0.8 10*3/uL (ref 0.1–1.0)
MONOS PCT: 12 %
NEUTROS PCT: 68 %
Neutro Abs: 4.6 10*3/uL (ref 1.7–7.7)
PLATELETS: 156 10*3/uL (ref 150–400)
RBC: 3.19 MIL/uL — ABNORMAL LOW (ref 4.22–5.81)
RDW: 14.1 % (ref 11.5–15.5)
WBC: 6.7 10*3/uL (ref 4.0–10.5)

## 2017-07-02 LAB — BASIC METABOLIC PANEL
Anion gap: 7 (ref 5–15)
BUN: 26 mg/dL — ABNORMAL HIGH (ref 6–20)
CALCIUM: 10.4 mg/dL — AB (ref 8.9–10.3)
CO2: 34 mmol/L — AB (ref 22–32)
CREATININE: 1.75 mg/dL — AB (ref 0.61–1.24)
Chloride: 98 mmol/L — ABNORMAL LOW (ref 101–111)
GFR calc Af Amer: 40 mL/min — ABNORMAL LOW (ref >60)
GFR calc non Af Amer: 35 mL/min — ABNORMAL LOW (ref >60)
Glucose, Bld: 98 mg/dL (ref 65–99)
Potassium: 4.5 mmol/L (ref 3.5–5.1)
Sodium: 139 mmol/L (ref 135–145)

## 2017-07-02 LAB — TROPONIN I: TROPONIN I: 0.06 ng/mL — AB (ref <0.03)

## 2017-07-02 LAB — BRAIN NATRIURETIC PEPTIDE: B NATRIURETIC PEPTIDE 5: 395 pg/mL — AB (ref 0.0–100.0)

## 2017-07-02 MED ORDER — FUROSEMIDE 10 MG/ML IJ SOLN
40.0000 mg | Freq: Once | INTRAMUSCULAR | Status: AC
  Administered 2017-07-02: 40 mg via INTRAVENOUS
  Filled 2017-07-02: qty 4

## 2017-07-02 MED ORDER — IPRATROPIUM-ALBUTEROL 0.5-2.5 (3) MG/3ML IN SOLN
3.0000 mL | Freq: Once | RESPIRATORY_TRACT | Status: AC
  Administered 2017-07-02: 3 mL via RESPIRATORY_TRACT
  Filled 2017-07-02: qty 3

## 2017-07-02 NOTE — ED Triage Notes (Signed)
Noted pt's oxygen tank was not puffing like it should at triage.  Switched to 2L continuous mode versus intermittent and oxygen sats increased.

## 2017-07-02 NOTE — ED Notes (Signed)
Ambulated pt on 3L O2 around nursing station from room 1. Pt SpO2 declined slowly from 99% to 91% while ambulating, once back in room patient stated he then felt 'winded' and O2 dropped rapidly to 82%. Pt placed back in bed and back on wall O2. Stayed with pt until pt caught his breath and stated feeling better, with O2 back up to 96%.

## 2017-07-02 NOTE — ED Notes (Signed)
Date and time results received: 07/02/17 1910  Test: Trop. Critical Value: 0.06  Name of Provider Notified: Dr. Charm BargesButler  Orders Received? Or Actions Taken?: No orders at this time.

## 2017-07-02 NOTE — Discharge Instructions (Addendum)
Your evaluated in the emergency department for increased shortness of breath.  Your testing did not show an obvious answer for your shortness of breath but it is possibly due to some extra fluid on your lungs.  We gave use a dose of a diuretic to help get some of this fluid off your lungs.  It is very important that you take your medications as prescribed by your doctor and call them tomorrow for close follow-up.  Please return to the emergency department if any worsening symptoms.

## 2017-07-02 NOTE — ED Provider Notes (Signed)
[facility] EMERGENCY DEPARTMENT Provider Note   CSN: [id number] Arrival date & time: 07/02/17  1729     History   Chief Complaint Chief Complaint  Patient presents with  . Shortness of Breath    HPI Harold Johnson is a 83 y.o. male.  He is a long-standing history of COPD and is on 3 L of home O2 24/7.  He was recently admitted on the 12th to the 14th with shortness of breath and found to have a COPD/CHF exacerbation.  He states his breathing really has not improved since discharge and is constantly short of breath.  It is worse at night and he cannot lay down flat has to sit up.  He denies any chest pain currently but states he has had on and off gas pains in his left lower chest.  He has had a cough but it is nonproductive.  He denies any fever.  His peripheral edema is baseline for him.  It is unclear if he is followed up with his doctor since discharge. The history is provided by the patient and the spouse.  Shortness of Breath  This is a recurrent problem. The problem occurs continuously.The problem has not changed since onset.Associated symptoms include cough, PND, orthopnea, chest pain and leg swelling. Pertinent negatives include no fever, no coryza, no rhinorrhea, no sore throat, no ear pain, no hemoptysis, no syncope, no vomiting, no abdominal pain, no rash, no leg pain and no claudication. It is unknown what precipitated the problem. He has tried beta-agonist inhalers for the symptoms. The treatment provided no relief. He has had prior hospitalizations. He has had prior ED visits. Associated medical issues include COPD and heart failure.    Past Medical History:  Diagnosis Date  . Acute respiratory failure with hypercapnia (HCC) 10/2011   Status post intubation and ventilation.  . Anemia 10/12/2011  . Chronic back pain   . Chronic respiratory failure with hypoxia (HCC)   . Chronic respiratory failure with hypoxia (HCC)   . COPD (chronic obstructive pulmonary disease) (HCC)    Oxygen-dependent. C02 retainer  . Elevated CK 10/13/2011   Possibly statin-induced rhabdomyolysis.  . Hypercholesteremia   . Hypertension   . Pneumonia   . Right axis deviation   . Stroke [facility])     Patient Active Problem List   Diagnosis Date Noted  . Acute on chronic diastolic CHF (congestive heart failure) (HCC) 06/18/2017  . Chronic diastolic CHF (congestive heart failure) (HCC) 06/17/2017  . Essential hypertension 06/17/2017  . Transaminasemia 06/17/2017  . Acute diastolic CHF (congestive heart failure) (HCC) 06/16/2017  . Acute on chronic respiratory failure with hypoxia (HCC) 06/07/2017  . CKD (chronic kidney disease) stage 3, GFR 30-59 ml/min (HCC) 06/07/2017  . COPD exacerbation (HCC) 01/16/2017  . AKI (acute kidney injury) (HCC) 01/16/2017  . Supplemental oxygen dependent 01/16/2017  . Elevated troponin 01/16/2017  . Hypernatremia 10/22/2011  . Debility 10/22/2011  . Dysphagia 10/21/2011  . Lethargy 10/21/2011  . Sinusitis 10/21/2011  . Acute renal failure (HCC) 10/15/2011  . Encephalopathy acute 10/14/2011  . Rhabdomyolysis 10/13/2011  . Hyperglycemia, drug-induced 10/13/2011  . COPD with acute exacerbation (HCC) 10/12/2011  . Tachycardia 10/12/2011  . Right axis deviation 10/12/2011  . Anemia 10/12/2011  . Chronic respiratory failure with hypoxia (HCC) 10/12/2011  . Acute respiratory failure with hypercapnia (HCC) 10/12/2011  . Chronic back pain 10/12/2011  . PNA (pneumonia) 10/12/2011  . Hypertension 10/12/2011    Past Surgical History:  Procedure Laterality Date  .  fatty tumor     Excision from left shoulder.  . INGUINAL HERNIA REPAIR          Home Medications    Prior to Admission medications   Medication Sig Start Date End Date Taking? Authorizing Provider  albuterol (PROVENTIL) (2.5 MG/3ML) 0.083% nebulizer solution Take 3 mLs (2.5 mg total) by nebulization every 6 (six) hours as needed for wheezing or shortness of breath. 06/08/17   Erick Blinks, MD  amLODipine (NORVASC) 10 MG tablet Take 10 mg by mouth daily.     [provider]  aspirin EC 81 MG tablet Take 81 mg by mouth daily.    [provider]  atorvastatin (LIPITOR) 10 MG tablet Take 1 tablet (10 mg total) by mouth daily at 6 PM. 06/18/17   Tat, Onalee Hua, MD  escitalopram (LEXAPRO) 10 MG tablet Take 10 mg by mouth daily.    [provider]  gabapentin (NEURONTIN) 300 MG capsule Take 1 capsule (300 mg total) by mouth at bedtime. Patient taking differently: Take 300 mg by mouth 3 (three) times daily.  10/24/11   Elliot Cousin, MD  guaiFENesin (MUCINEX) 600 MG 12 hr tablet Take 1 tablet (600 mg total) by mouth 2 (two) times daily. 06/08/17   Erick Blinks, MD  hydrALAZINE (APRESOLINE) 25 MG tablet Take 1 tablet (25 mg total) by mouth 2 (two) times daily. 06/18/17   Catarina Hartshorn, MD  losartan-hydrochlorothiazide (HYZAAR) 100-25 MG tablet Take 1 tablet by mouth daily.    [provider]  metoprolol tartrate (LOPRESSOR) 25 MG tablet Take 1 tablet (25 mg total) by mouth 2 (two) times daily. 06/08/17 06/08/18  Erick Blinks, MD  OXYGEN Inhale 2-3 L into the lungs daily.    [provider]  polyethylene glycol (MIRALAX / GLYCOLAX) packet Take 17 g by mouth daily as needed for mild constipation. 06/08/17   Erick Blinks, MD  predniSONE (DELTASONE) 10 MG tablet Take 6 tablets (60 mg total) by mouth daily with breakfast. And decrease by one tablet daily 06/19/17   Tat, Onalee Hua, MD  Tiotropium Bromide-Olodaterol (STIOLTO RESPIMAT) 2.5-2.5 MCG/ACT AERS Inhale 2 puffs into the lungs daily.    [provider]  VENTOLIN HFA 108 (90 Base) MCG/ACT inhaler Inhale 2 puffs into the lungs every 4 (four) hours as needed for wheezing.  12/26/16   [provider]    Family History Family History  Problem Relation Age of Onset  . Cardiomyopathy Father   . Hypertension Father     Social History Social History   Tobacco Use  . Smoking status:  Former Smoker    Packs/day: 1.00    Years: 25.00    Pack years: 25.00    Last attempt to quit: 06/18/1996    Years since quitting: 21.0  . Smokeless tobacco: Never Used  Substance Use Topics  . Alcohol use: No    Comment: used to  . Drug use: No     Allergies   Patient has no known allergies.   Review of Systems Review of Systems  Constitutional: Negative for chills and fever.  HENT: Negative for ear pain, rhinorrhea and sore throat.   Eyes: Negative for pain and visual disturbance.  Respiratory: Positive for cough and shortness of breath. Negative for hemoptysis.   Cardiovascular: Positive for chest pain, orthopnea, leg swelling and PND. Negative for palpitations, claudication and syncope.  Gastrointestinal: Negative for abdominal pain and vomiting.  Genitourinary: Negative for dysuria and hematuria.  Musculoskeletal: Negative for arthralgias and back  pain.  Skin: Negative for color change and rash.  Neurological: Negative for seizures and syncope.  All other systems reviewed and are negative.    Physical Exam Updated Vital Signs BP 120/76 (BP Location: Right Arm)   Pulse 71   Temp 98 F (36.7 C) (Oral)   Resp 16   Ht 5\' 9"  (1.753 m)   Wt 92.1 kg (203 lb)   SpO2 (!) 88%   BMI 29.98 kg/m   Physical Exam  Constitutional: He appears well-developed and well-nourished.  HENT:  Head: Normocephalic and atraumatic.  Eyes: Conjunctivae are normal.  Neck: Neck supple.  Cardiovascular: Normal rate and regular rhythm.  No murmur heard. Pulmonary/Chest: Accessory muscle usage present. No stridor. No respiratory distress. He has decreased breath sounds. He has no wheezes. He has no rales.  Abdominal: Soft. There is no tenderness.  Musculoskeletal: Normal range of motion.       Right lower leg: He exhibits edema. He exhibits no tenderness.       Left lower leg: He exhibits edema. He exhibits no tenderness.  Neurological: He is alert.  Skin: Skin is warm and dry.  Capillary refill takes less than 2 seconds.  Psychiatric: He has a normal mood and affect.  Nursing note and vitals reviewed.    ED Treatments / Results  Labs (all labs ordered are listed, but only abnormal results are displayed) Labs Reviewed  BASIC METABOLIC PANEL - Abnormal; Notable for the following components:      Result Value   Chloride 98 (*)    CO2 34 (*)    BUN 26 (*)    Creatinine, Ser 1.75 (*)    Calcium 10.4 (*)    GFR calc non Af Amer 35 (*)    GFR calc Af Amer 40 (*)    All other components within normal limits  CBC WITH DIFFERENTIAL/PLATELET - Abnormal; Notable for the following components:   RBC 3.19 (*)    Hemoglobin 9.2 (*)    HCT 30.9 (*)    MCHC 29.8 (*)    All other components within normal limits  TROPONIN I - Abnormal; Notable for the following components:   Troponin I 0.06 (*)    All other components within normal limits  BRAIN NATRIURETIC PEPTIDE - Abnormal; Notable for the following components:   B Natriuretic Peptide 395.0 (*)    All other components within normal limits  BLOOD GAS, VENOUS - Abnormal; Notable for the following components:   pCO2, Ven 65.7 (*)    pO2, Ven 143.0 (*)    Bicarbonate 32.9 (*)    Acid-Base Excess 10.0 (*)    All other components within normal limits    EKG EKG Interpretation  Date/Time:  Thursday July 02 2017 17:46:52 EDT Ventricular Rate:  69 PR Interval:  168 QRS Duration: 96 QT Interval:  382 QTC Calculation: 409 R Axis:   74 Text Interpretation:  Normal sinus rhythm with sinus arrhythmia ST & T wave abnormality, consider anterior ischemia Abnormal ECG improved from prior 3/19 Confirmed by Meridee ScoreButler, Jammie Troup [phone]([id number]) on 07/02/2017 6:12:47 PM   Radiology Dg Chest 2 View  Result Date: 07/02/2017 CLINICAL DATA:  Worsening shortness of breath for 3 weeks. COPD. Congestive heart failure. EXAM: CHEST - 2 VIEW COMPARISON:  06/16/2017 FINDINGS: The heart size and mediastinal contours are within normal limits. Both  lungs are clear. The visualized skeletal structures are unremarkable. IMPRESSION: No active cardiopulmonary disease. Electronically Signed   By: Myles RosenthalJohn  Stahl M.D.   On:  07/02/2017 19:14    Procedures Procedures (including critical care time)  Medications Ordered in ED Medications  ipratropium-albuterol (DUONEB) 0.5-2.5 (3) MG/3ML nebulizer solution 3 mL (3 mLs Nebulization Given 07/02/17 1912)  furosemide (LASIX) injection 40 mg (40 mg Intravenous Given 07/02/17 2009)     Initial Impression / Assessment and Plan / ED Course  I have reviewed the triage vital signs and the nursing notes.  Pertinent labs & imaging results that were available during my care of the patient were reviewed by me and considered in my medical decision making (see chart for details).  Clinical Course as of Jul 05 1711  Thu Jul 02, 2017  1905 Patient has chronic COPD and is chronically dyspneic as best as I can tell.  This is his third visit to the ED this month and he was admitted the other 2 times.  The last time it sounds like there was an overlay of CHF and he has mild troponin bump.  It is unclear what is changed to the cities felt short of breath like this since discharge.  His VBG looks like a compensated respiratory acidosis.  He is likely a chronic retainer.  He is asking for a neb treatment which we have ordered.  The rest of his labs and imaging are pending.   [MB]  2207 Patient ambulated around the department here on his oxygen.  He had a low of 87 but returned back above 90 with minimal rest.  He is comfortable going home and I do not see an acute need to hospitalize him.  We will send him back by ambulance to home and he understands to follow-up with his primary care doctor and his pulmonologist.   [MB]  2211 Patient's comfortable being discharged and will follow up with his primary care doctor.   [MB]    Clinical Course User Index [MB] Terrilee Files, MD     Final Clinical Impressions(s) / ED  Diagnoses   Final diagnoses:  Dyspnea on exertion    ED Discharge Orders    None       Terrilee Files, MD 07/04/17 1714

## 2017-07-02 NOTE — ED Notes (Signed)
Date and time results received: 07/02/17 7:03 PM  (use smartphrase ".now" to insert current time)  Test: Venous blood gas-pH 7.35, pCO2-65.7, pO2-143, bicarb-32.9, sat 98.9% Critical Value: See above  Name of Provider Notified: Charm BargesButler  Orders Received? Or Actions Taken?: Orders Received - See Orders for details

## 2017-07-02 NOTE — ED Notes (Signed)
Pt wheeled out on EMS stretcher. Pt verbalized understanding of discharge instructions.

## 2017-07-02 NOTE — ED Triage Notes (Signed)
Pt reports ongoing shortness of breath since last admission.  When asked when symptoms started, pt keeps repeating "I've been admitted twice for this."  Unsure of last nebulizer and home oxygen is not working at triage.

## 2017-07-03 NOTE — Progress Notes (Deleted)
Cardiology Office Note    Date:  07/03/2017   ID:  Harold Johnson, DOB [birth year], MRN [id number]  PCP:  Samuel Jester, DO  Cardiologist: Nona Dell, MD    No chief complaint on file.   History of Present Illness:    Harold Johnson is a 83 y.o. male with past medical history of chronic hypoxic respiratory failure (on 3L [place] at baseline), chronic diastolic CHF, HTN, HLD, Stage 3 CKD, and COPD  who presents to the office today for hospital follow-up.  He was recently admitted to any panel from 06/16/2017 to 06/18/2017 for evaluation of worsening dyspnea on exertion over the past several days. Initial BNP was found to be elevated at 603 and troponin enzymes peaked at 0.72. An echocardiogram was obtained which showed a preserved EF of 55 to 60%, grade 1 diastolic dysfunction, trivial MR, mild TR, and moderate PR. A repeat EKG was obtained later in the admission which showed normal sinus rhythm with significant anterior lateral T wave inversion concerning for ischemia. With his elevated enzymes and EKG changes, and ischemic evaluation was recommended but he declined further testing at that time. He was continued on ASA, BB, and statin therapy for empiric management of CAD. He diuresed well with IV Lasix and weight improved to 193 lbs at the time of discharge.      Past Medical History:  Diagnosis Date  . Acute respiratory failure with hypercapnia (HCC) 10/2011   Status post intubation and ventilation.  . Anemia 10/12/2011  . Chronic back pain   . Chronic respiratory failure with hypoxia (HCC)   . Chronic respiratory failure with hypoxia (HCC)   . COPD (chronic obstructive pulmonary disease) (HCC)    Oxygen-dependent. C02 retainer  . Elevated CK 10/13/2011   Possibly statin-induced rhabdomyolysis.  . Hypercholesteremia   . Hypertension   . Pneumonia   . Right axis deviation   . Stroke [facility])     Past Surgical History:  Procedure Laterality Date  . fatty tumor     Excision from left  shoulder.  . INGUINAL HERNIA REPAIR      Current Medications: Outpatient Medications Prior to Visit  Medication Sig Dispense Refill  . albuterol (PROVENTIL) (2.5 MG/3ML) 0.083% nebulizer solution Take 3 mLs (2.5 mg total) by nebulization every 6 (six) hours as needed for wheezing or shortness of breath. 75 mL 12  . amLODipine (NORVASC) 10 MG tablet Take 10 mg by mouth daily.     Marland Kitchen aspirin EC 81 MG tablet Take 81 mg by mouth daily.    Marland Kitchen atorvastatin (LIPITOR) 10 MG tablet Take 1 tablet (10 mg total) by mouth daily at 6 PM. 30 tablet 1  . escitalopram (LEXAPRO) 10 MG tablet Take 10 mg by mouth daily.    Marland Kitchen gabapentin (NEURONTIN) 300 MG capsule Take 1 capsule (300 mg total) by mouth at bedtime. (Patient taking differently: Take 300 mg by mouth 3 (three) times daily. )    . guaiFENesin (MUCINEX) 600 MG 12 hr tablet Take 1 tablet (600 mg total) by mouth 2 (two) times daily. 30 tablet 0  . hydrALAZINE (APRESOLINE) 25 MG tablet Take 1 tablet (25 mg total) by mouth 2 (two) times daily. 60 tablet 1  . losartan-hydrochlorothiazide (HYZAAR) 100-25 MG tablet Take 1 tablet by mouth daily.    . metoprolol tartrate (LOPRESSOR) 25 MG tablet Take 1 tablet (25 mg total) by mouth 2 (two) times daily. 60 tablet 11  . OXYGEN Inhale 3 L into the lungs daily.     Marland Kitchen  polyethylene glycol (MIRALAX / GLYCOLAX) packet Take 17 g by mouth daily as needed for mild constipation. 14 each 0  . predniSONE (DELTASONE) 10 MG tablet Take 6 tablets (60 mg total) by mouth daily with breakfast. And decrease by one tablet daily 21 tablet 0  . VENTOLIN HFA 108 (90 Base) MCG/ACT inhaler Inhale 2 puffs into the lungs every 4 (four) hours as needed for wheezing.      No facility-administered medications prior to visit.      Allergies:   Patient has no known allergies.   Social History   Socioeconomic History  . Marital status: Married    Spouse name: Not on file  . Number of children: Not on file  . Years of education: Not on file   . Highest education level: Not on file  Occupational History  . Not on file  Social Needs  . Financial resource strain: Not on file  . Food insecurity:    Worry: Not on file    Inability: Not on file  . Transportation needs:    Medical: Not on file    Non-medical: Not on file  Tobacco Use  . Smoking status: Former Smoker    Packs/day: 1.00    Years: 25.00    Pack years: 25.00    Last attempt to quit: 06/18/1996    Years since quitting: 21.0  . Smokeless tobacco: Never Used  Substance and Sexual Activity  . Alcohol use: No    Comment: used to  . Drug use: No  . Sexual activity: Not on file  Lifestyle  . Physical activity:    Days per week: Not on file    Minutes per session: Not on file  . Stress: Not on file  Relationships  . Social connections:    Talks on phone: Not on file    Gets together: Not on file    Attends religious service: Not on file    Active member of club or organization: Not on file    Attends meetings of clubs or organizations: Not on file    Relationship status: Not on file  Other Topics Concern  . Not on file  Social History Narrative  . Not on file     Family History:  The patient's ***family history includes Cardiomyopathy in his father; Hypertension in his father.   Review of Systems:   Please see the history of present illness.     General:  No chills, fever, night sweats or weight changes.  Cardiovascular:  No chest pain, dyspnea on exertion, edema, orthopnea, palpitations, paroxysmal nocturnal dyspnea. Dermatological: No rash, lesions/masses Respiratory: No cough, dyspnea Urologic: No hematuria, dysuria Abdominal:   No nausea, vomiting, diarrhea, bright red blood per rectum, melena, or hematemesis Neurologic:  No visual changes, wkns, changes in mental status. All other systems reviewed and are otherwise negative except as noted above.   Physical Exam:    VS:  There were no vitals taken for this visit.   General: Well developed,  well nourished,male appearing in no acute distress. Head: Normocephalic, atraumatic, sclera non-icteric, no xanthomas, nares are without discharge.  Neck: No carotid bruits. JVD not elevated.  Lungs: Respirations regular and unlabored, without wheezes or rales.  Heart: ***Regular rate and rhythm. No S3 or S4.  No murmur, no rubs, or gallops appreciated. Abdomen: Soft, non-tender, non-distended with normoactive bowel sounds. No hepatomegaly. No rebound/guarding. No obvious abdominal masses. Msk:  Strength and tone appear normal for age. No joint deformities or effusions.  Extremities: No clubbing or cyanosis. No edema.  Distal pedal pulses are 2+ bilaterally. Neuro: Alert and oriented X 3. Moves all extremities spontaneously. No focal deficits noted. Psych:  Responds to questions appropriately with a normal affect. Skin: No rashes or lesions noted  Wt Readings from Last 3 Encounters:  07/02/17 203 lb (92.1 kg)  06/18/17 193 lb 12.6 oz (87.9 kg)  06/07/17 198 lb 6.4 oz (90 kg)        Studies/Labs Reviewed:   EKG:  EKG is*** ordered today.  The ekg ordered today demonstrates ***  Recent Labs: 06/18/2017: ALT 77; Magnesium 1.7 07/02/2017: B Natriuretic Peptide 395.0; BUN 26; Creatinine, Ser 1.75; Hemoglobin 9.2; Platelets 156; Potassium 4.5; Sodium 139   Lipid Panel    Component Value Date/Time   TRIG 146 10/20/2011 0432    Additional studies/ records that were reviewed today include:   Echocardiogram: 06/17/2017 Study Conclusions  - Left ventricle: The cavity size was normal. Wall thickness was   increased increased in a pattern of mild to moderate LVH.   Systolic function was normal. The estimated ejection fraction was   in the range of 55% to 60%. Wall motion was normal; there were no   regional wall motion abnormalities. Doppler parameters are   consistent with abnormal left ventricular relaxation (grade 1   diastolic dysfunction). - Aortic valve: Trileaflet; mildly  calcified leaflets. - Mitral valve: There was trivial regurgitation. - Left atrium: The atrium was mildly dilated. - Right ventricle: The cavity size was mildly dilated. Systolic   function was mildly reduced. - Right atrium: The atrium was mildly dilated. Central venous   pressure (est): 3 mm Hg. - Tricuspid valve: There was mild regurgitation. - Pulmonic valve: There was moderate regurgitation. - Pulmonary arteries: Systolic pressure was moderately increased.   PA peak pressure: 54 mm Hg (S). - Pericardium, extracardiac: There was no pericardial effusion.  Assessment:    No diagnosis found.   Plan:   In order of problems listed above:  1. ***    Medication Adjustments/Labs and Tests Ordered: Current medicines are reviewed at length with the patient today.  Concerns regarding medicines are outlined above.  Medication changes, Labs and Tests ordered today are listed in the Patient Instructions below. There are no Patient Instructions on file for this visit.   Signed, Ellsworth Lennox, PA-C  07/03/2017 1:01 PM    [facility] [address] [place], [place] [address] Phone: [phone]

## 2017-07-07 ENCOUNTER — Ambulatory Visit: Payer: Medicare HMO | Admitting: Student

## 2017-07-08 ENCOUNTER — Encounter: Payer: Self-pay | Admitting: Student

## 2017-07-29 ENCOUNTER — Encounter (HOSPITAL_COMMUNITY): Payer: Self-pay | Admitting: Emergency Medicine

## 2017-07-29 ENCOUNTER — Emergency Department (HOSPITAL_COMMUNITY): Payer: Medicare HMO

## 2017-07-29 ENCOUNTER — Inpatient Hospital Stay (HOSPITAL_COMMUNITY)
Admission: EM | Admit: 2017-07-29 | Discharge: 2017-07-31 | DRG: 291 | Disposition: A | Discharge status: Home / Self Care | Payer: Medicare HMO | Attending: Family Medicine | Admitting: Family Medicine

## 2017-07-29 ENCOUNTER — Other Ambulatory Visit: Payer: Self-pay

## 2017-07-29 DIAGNOSIS — R55 Syncope and collapse: Secondary | ICD-10-CM | POA: Diagnosis not present

## 2017-07-29 DIAGNOSIS — N183 Chronic kidney disease, stage 3 unspecified: Secondary | ICD-10-CM | POA: Diagnosis present

## 2017-07-29 DIAGNOSIS — Z7982 Long term (current) use of aspirin: Secondary | ICD-10-CM

## 2017-07-29 DIAGNOSIS — I130 Hypertensive heart and chronic kidney disease with heart failure and stage 1 through stage 4 chronic kidney disease, or unspecified chronic kidney disease: Secondary | ICD-10-CM | POA: Diagnosis not present

## 2017-07-29 DIAGNOSIS — I10 Essential (primary) hypertension: Secondary | ICD-10-CM | POA: Diagnosis present

## 2017-07-29 DIAGNOSIS — H539 Unspecified visual disturbance: Secondary | ICD-10-CM | POA: Diagnosis present

## 2017-07-29 DIAGNOSIS — J441 Chronic obstructive pulmonary disease with (acute) exacerbation: Secondary | ICD-10-CM | POA: Diagnosis present

## 2017-07-29 DIAGNOSIS — R5383 Other fatigue: Secondary | ICD-10-CM | POA: Diagnosis present

## 2017-07-29 DIAGNOSIS — I5033 Acute on chronic diastolic (congestive) heart failure: Secondary | ICD-10-CM | POA: Diagnosis present

## 2017-07-29 DIAGNOSIS — R748 Abnormal levels of other serum enzymes: Secondary | ICD-10-CM | POA: Diagnosis present

## 2017-07-29 DIAGNOSIS — Z87891 Personal history of nicotine dependence: Secondary | ICD-10-CM

## 2017-07-29 DIAGNOSIS — Z9981 Dependence on supplemental oxygen: Secondary | ICD-10-CM

## 2017-07-29 DIAGNOSIS — J9621 Acute and chronic respiratory failure with hypoxia: Secondary | ICD-10-CM | POA: Diagnosis present

## 2017-07-29 DIAGNOSIS — J449 Chronic obstructive pulmonary disease, unspecified: Secondary | ICD-10-CM

## 2017-07-29 DIAGNOSIS — R778 Other specified abnormalities of plasma proteins: Secondary | ICD-10-CM | POA: Diagnosis present

## 2017-07-29 DIAGNOSIS — J9601 Acute respiratory failure with hypoxia: Secondary | ICD-10-CM | POA: Diagnosis present

## 2017-07-29 DIAGNOSIS — E785 Hyperlipidemia, unspecified: Secondary | ICD-10-CM | POA: Diagnosis present

## 2017-07-29 DIAGNOSIS — R7989 Other specified abnormal findings of blood chemistry: Secondary | ICD-10-CM

## 2017-07-29 DIAGNOSIS — R0600 Dyspnea, unspecified: Secondary | ICD-10-CM

## 2017-07-29 DIAGNOSIS — Z8249 Family history of ischemic heart disease and other diseases of the circulatory system: Secondary | ICD-10-CM

## 2017-07-29 DIAGNOSIS — Z8673 Personal history of transient ischemic attack (TIA), and cerebral infarction without residual deficits: Secondary | ICD-10-CM

## 2017-07-29 LAB — TROPONIN I
Troponin I: 0.06 ng/mL (ref <0.03)
Troponin I: 0.05 ng/mL (ref <0.03)

## 2017-07-29 LAB — D-DIMER, QUANTITATIVE (NOT AT ARMC): D DIMER QUANT: 0.82 ug{FEU}/mL — AB (ref 0.00–0.50)

## 2017-07-29 LAB — CBC WITH DIFFERENTIAL/PLATELET
BASOS ABS: 0 10*3/uL (ref 0.0–0.1)
Basophils Relative: 0 %
EOS PCT: 2 %
Eosinophils Absolute: 0.1 10*3/uL (ref 0.0–0.7)
HCT: 33.6 % — ABNORMAL LOW (ref 39.0–52.0)
Hemoglobin: 10 g/dL — ABNORMAL LOW (ref 13.0–17.0)
LYMPHS ABS: 2.3 10*3/uL (ref 0.7–4.0)
LYMPHS PCT: 29 %
MCH: 28.8 pg (ref 26.0–34.0)
MCHC: 29.8 g/dL — ABNORMAL LOW (ref 30.0–36.0)
MCV: 96.8 fL (ref 78.0–100.0)
MONO ABS: 1.3 10*3/uL — AB (ref 0.1–1.0)
Monocytes Relative: 17 %
Neutro Abs: 4.3 10*3/uL (ref 1.7–7.7)
Neutrophils Relative %: 52 %
PLATELETS: 169 10*3/uL (ref 150–400)
RBC: 3.47 MIL/uL — ABNORMAL LOW (ref 4.22–5.81)
RDW: 13.7 % (ref 11.5–15.5)
WBC: 8.1 10*3/uL (ref 4.0–10.5)

## 2017-07-29 LAB — COMPREHENSIVE METABOLIC PANEL
ALT: 17 U/L (ref 17–63)
ANION GAP: 12 (ref 5–15)
AST: 27 U/L (ref 15–41)
Albumin: 3.9 g/dL (ref 3.5–5.0)
Alkaline Phosphatase: 52 U/L (ref 38–126)
BUN: 22 mg/dL — ABNORMAL HIGH (ref 6–20)
CHLORIDE: 94 mmol/L — AB (ref 101–111)
CO2: 34 mmol/L — ABNORMAL HIGH (ref 22–32)
Calcium: 11.3 mg/dL — ABNORMAL HIGH (ref 8.9–10.3)
Creatinine, Ser: 1.82 mg/dL — ABNORMAL HIGH (ref 0.61–1.24)
GFR, EST AFRICAN AMERICAN: 38 mL/min — AB (ref >60)
GFR, EST NON AFRICAN AMERICAN: 33 mL/min — AB (ref >60)
Glucose, Bld: 121 mg/dL — ABNORMAL HIGH (ref 65–99)
Potassium: 3.9 mmol/L (ref 3.5–5.1)
Sodium: 140 mmol/L (ref 135–145)
Total Bilirubin: 0.9 mg/dL (ref 0.3–1.2)
Total Protein: 7.5 g/dL (ref 6.5–8.1)

## 2017-07-29 LAB — CBG MONITORING, ED: GLUCOSE-CAPILLARY: 123 mg/dL — AB (ref 65–99)

## 2017-07-29 MED ORDER — ALBUTEROL SULFATE (2.5 MG/3ML) 0.083% IN NEBU: 5.0000 mg | INHALATION_SOLUTION | Freq: Once | RESPIRATORY_TRACT | Status: DC

## 2017-07-29 MED ORDER — SODIUM CHLORIDE 0.9% FLUSH: 3.0000 mL | INTRAVENOUS | Status: DC | PRN

## 2017-07-29 MED ORDER — HYDRALAZINE HCL 20 MG/ML IJ SOLN
10.0000 mg | INTRAMUSCULAR | Status: DC | PRN
  Administered 2017-07-30 – 2017-07-31 (×2): 10 mg via INTRAVENOUS
  Filled 2017-07-29 (×2): qty 1

## 2017-07-29 MED ORDER — LOSARTAN POTASSIUM 50 MG PO TABS
100.0000 mg | ORAL_TABLET | Freq: Every day | ORAL | Status: DC
  Administered 2017-07-30 – 2017-07-31 (×2): 100 mg via ORAL
  Filled 2017-07-29 (×2): qty 2

## 2017-07-29 MED ORDER — GUAIFENESIN ER 600 MG PO TB12
600.0000 mg | ORAL_TABLET | Freq: Two times a day (BID) | ORAL | Status: DC
  Administered 2017-07-29 – 2017-07-31 (×4): 600 mg via ORAL
  Filled 2017-07-29 (×4): qty 1

## 2017-07-29 MED ORDER — ONDANSETRON HCL 4 MG/2ML IJ SOLN: 4.0000 mg | Freq: Four times a day (QID) | INTRAMUSCULAR | Status: DC | PRN

## 2017-07-29 MED ORDER — ENOXAPARIN SODIUM 40 MG/0.4ML ~~LOC~~ SOLN
40.0000 mg | SUBCUTANEOUS | Status: DC
  Administered 2017-07-29 – 2017-07-30 (×2): 40 mg via SUBCUTANEOUS
  Filled 2017-07-29: qty 0.4

## 2017-07-29 MED ORDER — HYDRALAZINE HCL 25 MG PO TABS
25.0000 mg | ORAL_TABLET | Freq: Two times a day (BID) | ORAL | Status: DC
  Administered 2017-07-29 – 2017-07-31 (×4): 25 mg via ORAL
  Filled 2017-07-29 (×4): qty 1

## 2017-07-29 MED ORDER — SODIUM CHLORIDE 0.9 % IV BOLUS
500.0000 mL | Freq: Once | INTRAVENOUS | Status: AC
  Administered 2017-07-29: 500 mL via INTRAVENOUS

## 2017-07-29 MED ORDER — ASPIRIN EC 81 MG PO TBEC
81.0000 mg | DELAYED_RELEASE_TABLET | Freq: Every day | ORAL | Status: DC
  Administered 2017-07-30 – 2017-07-31 (×2): 81 mg via ORAL
  Filled 2017-07-29 (×2): qty 1

## 2017-07-29 MED ORDER — SODIUM CHLORIDE 0.9% FLUSH
3.0000 mL | Freq: Two times a day (BID) | INTRAVENOUS | Status: DC
Start: 2017-07-29 — End: 2017-07-31
  Administered 2017-07-29 – 2017-07-31 (×4): 3 mL via INTRAVENOUS

## 2017-07-29 MED ORDER — ONDANSETRON HCL 4 MG PO TABS: 4.0000 mg | ORAL_TABLET | Freq: Four times a day (QID) | ORAL | Status: DC | PRN

## 2017-07-29 MED ORDER — METOPROLOL TARTRATE 25 MG PO TABS
25.0000 mg | ORAL_TABLET | Freq: Two times a day (BID) | ORAL | Status: DC
  Administered 2017-07-29 – 2017-07-30 (×3): 25 mg via ORAL
  Filled 2017-07-29 (×3): qty 1

## 2017-07-29 MED ORDER — ACETAMINOPHEN 325 MG PO TABS
650.0000 mg | ORAL_TABLET | Freq: Four times a day (QID) | ORAL | Status: DC | PRN
  Administered 2017-07-30: 650 mg via ORAL
  Filled 2017-07-29: qty 2

## 2017-07-29 MED ORDER — ACETAMINOPHEN 650 MG RE SUPP: 650.0000 mg | Freq: Four times a day (QID) | RECTAL | Status: DC | PRN

## 2017-07-29 MED ORDER — ESCITALOPRAM OXALATE 10 MG PO TABS
10.0000 mg | ORAL_TABLET | Freq: Every day | ORAL | Status: DC
  Administered 2017-07-30 – 2017-07-31 (×2): 10 mg via ORAL
  Filled 2017-07-29 (×2): qty 1

## 2017-07-29 MED ORDER — SODIUM CHLORIDE 0.9 % IV SOLN: 250.0000 mL | INTRAVENOUS | Status: DC | PRN

## 2017-07-29 MED ORDER — FUROSEMIDE 10 MG/ML IJ SOLN
40.0000 mg | Freq: Two times a day (BID) | INTRAMUSCULAR | Status: DC
  Administered 2017-07-30 – 2017-07-31 (×3): 40 mg via INTRAVENOUS
  Filled 2017-07-29 (×3): qty 4

## 2017-07-29 MED ORDER — ATORVASTATIN CALCIUM 10 MG PO TABS
10.0000 mg | ORAL_TABLET | Freq: Every day | ORAL | Status: DC
  Administered 2017-07-29 – 2017-07-30 (×2): 10 mg via ORAL
  Filled 2017-07-29 (×4): qty 1

## 2017-07-29 MED ORDER — POLYETHYLENE GLYCOL 3350 17 G PO PACK: 17.0000 g | PACK | Freq: Every day | ORAL | Status: DC | PRN

## 2017-07-29 NOTE — ED Triage Notes (Signed)
Per family member, pt passed out, LOC and sob today.  Pt states having these symptoms x 2 weeks.  Pt state " I have been feeling and I cant see good right now"

## 2017-07-29 NOTE — ED Notes (Signed)
CRITICAL VALUE ALERT  Critical Value:  Troponin 0.06  Date & Time Notied:  07/29/2017 at 1353  Provider Notified: Dr. Adriana Simasook

## 2017-07-29 NOTE — ED Notes (Signed)
Pt on home oxygen at 2 liters.  sats on 2 liters 77% after getting up from wheelchair  to get into bed.  increased oxygen to 4 liters [place].

## 2017-07-29 NOTE — ED Provider Notes (Signed)
[facility] EMERGENCY DEPARTMENT Provider Note   CSN: [id number] Arrival date & time: 07/29/17  1212     History   Chief Complaint Chief Complaint  Patient presents with  . Shortness of Breath  . Loss of Consciousness    HPI Harold Johnson is a 83 y.o. male.  Level 5 caveat for urgent need for intervention.  Patient has a known history of severe COPD and he is on chronic oxygen therapy at home.  He had a syncopal spell earlier today and he his oxygen saturations were noted to be in the 77% range.  Additionally he complained of a visual disturbance.  No chest pain, fever, sweats, chills, productive sputum.  He lives at home with his wife.     Past Medical History:  Diagnosis Date  . Acute respiratory failure with hypercapnia (HCC) 10/2011   Status post intubation and ventilation.  . Anemia 10/12/2011  . Chronic back pain   . Chronic respiratory failure with hypoxia (HCC)   . Chronic respiratory failure with hypoxia (HCC)   . COPD (chronic obstructive pulmonary disease) (HCC)    Oxygen-dependent. C02 retainer  . Elevated CK 10/13/2011   Possibly statin-induced rhabdomyolysis.  . Hypercholesteremia   . Hypertension   . Pneumonia   . Right axis deviation   . Stroke [facility])     Patient Active Problem List   Diagnosis Date Noted  . Acute on chronic diastolic CHF (congestive heart failure) (HCC) 06/18/2017  . Chronic diastolic CHF (congestive heart failure) (HCC) 06/17/2017  . Essential hypertension 06/17/2017  . Transaminasemia 06/17/2017  . Acute diastolic CHF (congestive heart failure) (HCC) 06/16/2017  . Acute on chronic respiratory failure with hypoxia (HCC) 06/07/2017  . CKD (chronic kidney disease) stage 3, GFR 30-59 ml/min (HCC) 06/07/2017  . COPD exacerbation (HCC) 01/16/2017  . AKI (acute kidney injury) (HCC) 01/16/2017  . Supplemental oxygen dependent 01/16/2017  . Elevated troponin 01/16/2017  . Hypernatremia 10/22/2011  . Debility 10/22/2011  . Dysphagia  10/21/2011  . Lethargy 10/21/2011  . Sinusitis 10/21/2011  . Acute renal failure (HCC) 10/15/2011  . Encephalopathy acute 10/14/2011  . Rhabdomyolysis 10/13/2011  . Hyperglycemia, drug-induced 10/13/2011  . COPD with acute exacerbation (HCC) 10/12/2011  . Tachycardia 10/12/2011  . Right axis deviation 10/12/2011  . Anemia 10/12/2011  . Chronic respiratory failure with hypoxia (HCC) 10/12/2011  . Acute respiratory failure with hypercapnia (HCC) 10/12/2011  . Chronic back pain 10/12/2011  . PNA (pneumonia) 10/12/2011  . Hypertension 10/12/2011    Past Surgical History:  Procedure Laterality Date  . fatty tumor     Excision from left shoulder.  . INGUINAL HERNIA REPAIR          Home Medications    Prior to Admission medications   Medication Sig Start Date End Date Taking? Authorizing Provider  albuterol (PROVENTIL) (2.5 MG/3ML) 0.083% nebulizer solution Take 3 mLs (2.5 mg total) by nebulization every 6 (six) hours as needed for wheezing or shortness of breath. 06/08/17  Yes Erick Blinks, MD  aspirin EC 81 MG tablet Take 81 mg by mouth daily.   Yes [provider]  atorvastatin (LIPITOR) 10 MG tablet Take 1 tablet (10 mg total) by mouth daily at 6 PM. 06/18/17  Yes Tat, Onalee Hua, MD  escitalopram (LEXAPRO) 10 MG tablet Take 10 mg by mouth daily.   Yes [provider]  guaiFENesin (MUCINEX) 600 MG 12 hr tablet Take 1 tablet (600 mg total) by mouth 2 (two) times daily. 06/08/17  Yes Memon,  Durward Mallard, MD  hydrALAZINE (APRESOLINE) 25 MG tablet Take 1 tablet (25 mg total) by mouth 2 (two) times daily. 06/18/17  Yes Tat, Onalee Hua, MD  losartan-hydrochlorothiazide (HYZAAR) 100-25 MG tablet Take 1 tablet by mouth daily.   Yes [provider]  metoprolol tartrate (LOPRESSOR) 25 MG tablet Take 1 tablet (25 mg total) by mouth 2 (two) times daily. 06/08/17 06/08/18 Yes Erick Blinks, MD  OXYGEN Inhale 3 L into the lungs daily.    Yes [provider]  polyethylene  glycol (MIRALAX / GLYCOLAX) packet Take 17 g by mouth daily as needed for mild constipation. 06/08/17  Yes Erick Blinks, MD  VENTOLIN HFA 108 (90 Base) MCG/ACT inhaler Inhale 2 puffs into the lungs every 4 (four) hours as needed for wheezing.  12/26/16  Yes [provider]  predniSONE (DELTASONE) 10 MG tablet Take 6 tablets (60 mg total) by mouth daily with breakfast. And decrease by one tablet daily Patient not taking: Reported on 07/29/2017 06/19/17   Catarina Hartshorn, MD    Family History Family History  Problem Relation Age of Onset  . Cardiomyopathy Father   . Hypertension Father     Social History Social History   Tobacco Use  . Smoking status: Former Smoker    Packs/day: 1.00    Years: 25.00    Pack years: 25.00    Last attempt to quit: 06/18/1996    Years since quitting: 21.1  . Smokeless tobacco: Never Used  Substance Use Topics  . Alcohol use: No    Comment: used to  . Drug use: No     Allergies   Patient has no known allergies.   Review of Systems Review of Systems  Unable to perform ROS: Acuity of condition     Physical Exam Updated Vital Signs BP (!) 170/99   Pulse 60   Temp 98.1 F (36.7 C) (Oral)   Resp 20   Ht 5\' 9"  (1.753 m)   Wt 92.1 kg (203 lb)   SpO2 100%   BMI 29.98 kg/m   Physical Exam  Constitutional:  On nasal oxygen, no respiratory distress  HENT:  Head: Normocephalic and atraumatic.  Eyes: Conjunctivae are normal.  Neck: Neck supple.  Cardiovascular: Normal rate and regular rhythm.  Pulmonary/Chest: Effort normal and breath sounds normal.  Abdominal: Soft. Bowel sounds are normal.  Musculoskeletal: Normal range of motion.  Neurological: He is alert.  Skin: Skin is warm and dry.  Psychiatric:  Flat affect  Nursing note and vitals reviewed.    ED Treatments / Results  Labs (all labs ordered are listed, but only abnormal results are displayed) Labs Reviewed  CBC WITH DIFFERENTIAL/PLATELET - Abnormal; Notable for the  following components:      Result Value   RBC 3.47 (*)    Hemoglobin 10.0 (*)    HCT 33.6 (*)    MCHC 29.8 (*)    Monocytes Absolute 1.3 (*)    All other components within normal limits  COMPREHENSIVE METABOLIC PANEL - Abnormal; Notable for the following components:   Chloride 94 (*)    CO2 34 (*)    Glucose, Bld 121 (*)    BUN 22 (*)    Creatinine, Ser 1.82 (*)    Calcium 11.3 (*)    GFR calc non Af Amer 33 (*)    GFR calc Af Amer 38 (*)    All other components within normal limits  TROPONIN I - Abnormal; Notable for the following components:   Troponin I  0.06 (*)    All other components within normal limits  D-DIMER, QUANTITATIVE (NOT AT [facility]) - Abnormal; Notable for the following components:   D-Dimer, Quant 0.82 (*)    All other components within normal limits  TROPONIN I - Abnormal; Notable for the following components:   Troponin I 0.05 (*)    All other components within normal limits  CBG MONITORING, ED - Abnormal; Notable for the following components:   Glucose-Capillary 123 (*)    All other components within normal limits    EKG EKG Interpretation  Date/Time:  Wednesday July 29 2017 12:46:38 EDT Ventricular Rate:  65 PR Interval:  176 QRS Duration: 106 QT Interval:  408 QTC Calculation: 424 R Axis:   108 Text Interpretation:  Sinus rhythm with marked sinus arrhythmia Possible Right ventricular hypertrophy ST & T wave abnormality, consider anterior ischemia Abnormal ECG Confirmed by Donnetta Hutchingook, Gertrue Willette ([id number]) on 07/29/2017 1:02:21 PM   Radiology Dg Chest 2 View  Result Date: 07/29/2017 CLINICAL DATA:  Chronic shortness of breath. EXAM: CHEST - 2 VIEW COMPARISON:  PA and lateral chest 07/02/2017, 06/16/2017 and 01/01/2017. FINDINGS: There is cardiomegaly and vascular congestion. No consolidative process or pneumothorax. Tiny left pleural effusion noted. No acute bony abnormality. IMPRESSION: Cardiomegaly and pulmonary vascular congestion. Tiny left pleural effusion.  Electronically Signed   By: Drusilla Kannerhomas  Dalessio M.D.   On: 07/29/2017 14:21    Procedures Procedures (including critical care time)  Medications Ordered in ED Medications  sodium chloride 0.9 % bolus 500 mL (0 mLs Intravenous Stopped 07/29/17 1429)     Initial Impression / Assessment and Plan / ED Course  I have reviewed the triage vital signs and the nursing notes.  Pertinent labs & imaging results that were available during my care of the patient were reviewed by me and considered in my medical decision making (see chart for details).     Uncertain etiology of patient's syncopal episode and low pulse ox.  Troponin minimally elevated x2.  Age-adjusted d-dimer within normal limits.  EEG shows normal sinus rhythm.  Chest x-ray shows cardiomegaly and pulmonary vascular congestion.  Will admit for observation to further delineate his symptoms.  Final Clinical Impressions(s) / ED Diagnoses   Final diagnoses:  Syncope, unspecified syncope type  Chronic obstructive pulmonary disease, unspecified COPD type (HCC)  Elevated troponin    ED Discharge Orders    None       Donnetta Hutchingook, Chong January, MD 07/29/17 1944

## 2017-07-29 NOTE — ED Notes (Signed)
sats 100 % on 4 liters.  Pt in bed .  Decreased oxygen to 2 liters.

## 2017-07-29 NOTE — ED Notes (Signed)
Report given to Pharrornelius, Charity fundraiserN.

## 2017-07-29 NOTE — H&P (Signed)
History and Physical    Harold CollaJohn Hornstein ZOX:[id number] DOB: 03-08-1935 DOA: 07/29/2017  PCP: Samuel JesterButler, Cynthia, DO   Patient coming from: Home  Chief Complaint: Worsening shortness of breath and presyncope  HPI: Harold Johnson is a 83 y.o. male with medical history significant for COPD on chronic 2 L nasal cannula at home, grade 1 diastolic congestive heart failure with moderate LVH and EF 55 to 60% on echo 06/2017, hypertension, dyslipidemia, and CKD stage III who presented to the emergency department with a presyncopal event where he felt as though he was going to pass out earlier today.  This is in the setting of worsening bilateral lower extremity edema as well as some dyspnea on exertion that has been progressive over the last couple weeks.  EMS had noted oxygen saturation to be 77% on arrival.  She seems to think that his home oxygen equipment may not be adequate for him anymore.  He does not appear to have any symptoms at rest.  He currently lives at home with his wife. Patient denies any significant orthopnea, PND, chest pain, palpitations, diaphoresis, nausea, or vomiting. He denies any fever, chills, chest tightness, wheezing, or cough.   ED Course: Vital signs noted to be stable with some elevated blood pressure readings noted.  His laboratory data appears stable with creatinine of 1.82 baseline 1.75 with noted CKD stage III.  Initial troponin 0.05 and subsequent 0.06.  Two-view chest x-ray with some cardiomegaly and pulmonary vascular congestion and left-sided pleural effusion noted.  He has been given 500 mL normal saline bolus and is currently on 3 L nasal cannula saturating well with no acute respiratory distress.  He is otherwise a poor historian and continuously gets confused when being asked questions.  Review of Systems: All others reviewed and otherwise negative.  Past Medical History:  Diagnosis Date  . Acute respiratory failure with hypercapnia (HCC) 10/2011   Status post intubation  and ventilation.  . Anemia 10/12/2011  . Chronic back pain   . Chronic respiratory failure with hypoxia (HCC)   . Chronic respiratory failure with hypoxia (HCC)   . COPD (chronic obstructive pulmonary disease) (HCC)    Oxygen-dependent. C02 retainer  . Elevated CK 10/13/2011   Possibly statin-induced rhabdomyolysis.  . Hypercholesteremia   . Hypertension   . Pneumonia   . Right axis deviation   . Stroke [facility](HCC)     Past Surgical History:  Procedure Laterality Date  . fatty tumor     Excision from left shoulder.  . INGUINAL HERNIA REPAIR       reports that he quit smoking about 21 years ago. He has a 25.00 pack-year smoking history. He has never used smokeless tobacco. He reports that he does not drink alcohol or use drugs.  No Known Allergies  Family History  Problem Relation Age of Onset  . Cardiomyopathy Father   . Hypertension Father     Prior to Admission medications   Medication Sig Start Date End Date Taking? Authorizing Provider  albuterol (PROVENTIL) (2.5 MG/3ML) 0.083% nebulizer solution Take 3 mLs (2.5 mg total) by nebulization every 6 (six) hours as needed for wheezing or shortness of breath. 06/08/17  Yes Erick BlinksMemon, Jehanzeb, MD  aspirin EC 81 MG tablet Take 81 mg by mouth daily.   Yes [provider]  atorvastatin (LIPITOR) 10 MG tablet Take 1 tablet (10 mg total) by mouth daily at 6 PM. 06/18/17  Yes Tat, Onalee Huaavid, MD  escitalopram (LEXAPRO) 10 MG tablet Take 10 mg by  mouth daily.   Yes [provider]  guaiFENesin (MUCINEX) 600 MG 12 hr tablet Take 1 tablet (600 mg total) by mouth 2 (two) times daily. 06/08/17  Yes Erick Blinks, MD  hydrALAZINE (APRESOLINE) 25 MG tablet Take 1 tablet (25 mg total) by mouth 2 (two) times daily. 06/18/17  Yes Tat, Onalee Hua, MD  losartan-hydrochlorothiazide (HYZAAR) 100-25 MG tablet Take 1 tablet by mouth daily.   Yes [provider]  metoprolol tartrate (LOPRESSOR) 25 MG tablet Take 1 tablet (25 mg total) by mouth 2  (two) times daily. 06/08/17 06/08/18 Yes Erick Blinks, MD  OXYGEN Inhale 3 L into the lungs daily.    Yes [provider]  polyethylene glycol (MIRALAX / GLYCOLAX) packet Take 17 g by mouth daily as needed for mild constipation. 06/08/17  Yes Erick Blinks, MD  VENTOLIN HFA 108 (90 Base) MCG/ACT inhaler Inhale 2 puffs into the lungs every 4 (four) hours as needed for wheezing.  12/26/16  Yes [provider]  predniSONE (DELTASONE) 10 MG tablet Take 6 tablets (60 mg total) by mouth daily with breakfast. And decrease by one tablet daily Patient not taking: Reported on 07/29/2017 06/19/17   Catarina Hartshorn, MD    Physical Exam: Vitals:   07/29/17 2000 07/29/17 2030 07/29/17 2100 07/29/17 2130  BP: (!) 182/96 (!) 198/83 (!) 191/99 (!) 182/82  Pulse: 64 (!) 58 87 61  Resp: 20 (!) 21 17 (!) 21  Temp:      TempSrc:      SpO2: 100% 100% 96% 100%  Weight:      Height:        Constitutional: NAD, calm, comfortable Vitals:   07/29/17 2000 07/29/17 2030 07/29/17 2100 07/29/17 2130  BP: (!) 182/96 (!) 198/83 (!) 191/99 (!) 182/82  Pulse: 64 (!) 58 87 61  Resp: 20 (!) 21 17 (!) 21  Temp:      TempSrc:      SpO2: 100% 100% 96% 100%  Weight:      Height:       Eyes: lids and conjunctivae normal ENMT: Mucous membranes are moist.  Neck: normal, supple Respiratory: clear to auscultation bilaterally. Normal respiratory effort. No accessory muscle use.  Currently on 3 L nasal cannula. Cardiovascular: Regular rate and rhythm, no murmurs.  Scant lower extremity edema to mid shin bilaterally. Abdomen: no tenderness, no distention. Bowel sounds positive.  Musculoskeletal:  No joint deformity upper and lower extremities.   Skin: no rashes, lesions, ulcers.   Labs on Admission: I have personally reviewed following labs and imaging studies  CBC: Recent Labs  Lab 07/29/17 1307  WBC 8.1  NEUTROABS 4.3  HGB 10.0*  HCT 33.6*  MCV 96.8  PLT 169   Basic Metabolic Panel: Recent Labs    Lab 07/29/17 1307  NA 140  K 3.9  CL 94*  CO2 34*  GLUCOSE 121*  BUN 22*  CREATININE 1.82*  CALCIUM 11.3*   GFR: Estimated Creatinine Clearance: 35.7 mL/min (A) (by C-G formula based on SCr of 1.82 mg/dL (H)). Liver Function Tests: Recent Labs  Lab 07/29/17 1307  AST 27  ALT 17  ALKPHOS 52  BILITOT 0.9  PROT 7.5  ALBUMIN 3.9   No results for input(s): LIPASE, AMYLASE in the last 168 hours. No results for input(s): AMMONIA in the last 168 hours. Coagulation Profile: No results for input(s): INR, PROTIME in the last 168 hours. Cardiac Enzymes: Recent Labs  Lab 07/29/17 1307 07/29/17 1657  TROPONINI 0.06* 0.05*  BNP (last 3 results) No results for input(s): PROBNP in the last 8760 hours. HbA1C: No results for input(s): HGBA1C in the last 72 hours. CBG: Recent Labs  Lab 07/29/17 1230  GLUCAP 123*   Lipid Profile: No results for input(s): CHOL, HDL, LDLCALC, TRIG, CHOLHDL, LDLDIRECT in the last 72 hours. Thyroid Function Tests: No results for input(s): TSH, T4TOTAL, FREET4, T3FREE, THYROIDAB in the last 72 hours. Anemia Panel: No results for input(s): VITAMINB12, FOLATE, FERRITIN, TIBC, IRON, RETICCTPCT in the last 72 hours. Urine analysis:    Component Value Date/Time   COLORURINE YELLOW 10/12/2011 1019   APPEARANCEUR CLEAR 10/12/2011 1019   LABSPEC >1.030 (H) 10/12/2011 1019   PHURINE 6.0 10/12/2011 1019   GLUCOSEU NEGATIVE 10/12/2011 1019   HGBUR LARGE (A) 10/12/2011 1019   BILIRUBINUR NEGATIVE 10/12/2011 1019   KETONESUR NEGATIVE 10/12/2011 1019   PROTEINUR 100 (A) 10/12/2011 1019   UROBILINOGEN 0.2 10/12/2011 1019   NITRITE NEGATIVE 10/12/2011 1019   LEUKOCYTESUR NEGATIVE 10/12/2011 1019    Radiological Exams on Admission: Dg Chest 2 View  Result Date: 07/29/2017 CLINICAL DATA:  Chronic shortness of breath. EXAM: CHEST - 2 VIEW COMPARISON:  PA and lateral chest 07/02/2017, 06/16/2017 and 01/01/2017. FINDINGS: There is cardiomegaly and vascular  congestion. No consolidative process or pneumothorax. Tiny left pleural effusion noted. No acute bony abnormality. IMPRESSION: Cardiomegaly and pulmonary vascular congestion. Tiny left pleural effusion. Electronically Signed   By: Drusilla Kanner M.D.   On: 07/29/2017 14:21    EKG: Independently reviewed.  Sinus arrhythmia 65 bpm.  Assessment/Plan Principal Problem:   Acute on chronic respiratory failure with hypoxia (HCC) Active Problems:   Lethargy   Elevated troponin   CKD (chronic kidney disease) stage 3, GFR 30-59 ml/min (HCC)   Essential hypertension   Acute on chronic diastolic CHF (congestive heart failure) (HCC)   COPD (chronic obstructive pulmonary disease) (HCC)    1. Acute on chronic hypoxemic respiratory failure likely secondary to acute on chronic diastolic CHF.  Will check BNP and placed on 40 mg IV Lasix twice daily and monitor daily weights and strict I's and O's.  Try to wean oxygen to baseline and perform ambulatory O2 sat once euvolemic.  Maintain on fluid restriction. 2. Presyncope.  This is likely related to above and patient has had recent echocardiogram on 06/2017, therefore I will not repeat.  I will check carotid Dopplers and orthostatic vitals however.  Placed on fall precautions. 3. Elevated troponin.  I do not think this is significant and 2 sets have remained flat thus far.  Continue to monitor on telemetry with no further troponin at this time. 4. Hypertension.  Continue home medication, but avoid HCTZ.  I will place on hydralazine as needed for significant blood pressure elevations. 5. COPD with chronic hypoxemia.  No acute bronchospasms currently noted, will place on duo nebs every 6 hours as needed for shortness of breath or wheezing. 6. Dyslipidemia.  Continue statin.   DVT prophylaxis: Lovenox Code Status: Full Family Communication: None at bedside Disposition Plan: IV diuresis with Lasix for treatment of hypoxemic respiratory failure as well as  orthostatic evaluation and carotid Dopplers for possible near syncope Consults called: None Admission status: Observation, telemetry   Maxcine Strong Hoover Brunette DO [facility] Pager [phone]  If 7PM-7AM, please contact night-coverage [email or web address] Password [facility]  07/29/2017, 10:13 PM

## 2017-07-30 ENCOUNTER — Inpatient Hospital Stay (HOSPITAL_COMMUNITY): Payer: Medicare HMO

## 2017-07-30 ENCOUNTER — Observation Stay (HOSPITAL_COMMUNITY): Payer: Medicare HMO

## 2017-07-30 DIAGNOSIS — I5033 Acute on chronic diastolic (congestive) heart failure: Secondary | ICD-10-CM

## 2017-07-30 DIAGNOSIS — H539 Unspecified visual disturbance: Secondary | ICD-10-CM | POA: Diagnosis present

## 2017-07-30 DIAGNOSIS — I10 Essential (primary) hypertension: Secondary | ICD-10-CM | POA: Diagnosis not present

## 2017-07-30 DIAGNOSIS — Z8673 Personal history of transient ischemic attack (TIA), and cerebral infarction without residual deficits: Secondary | ICD-10-CM | POA: Diagnosis not present

## 2017-07-30 DIAGNOSIS — N183 Chronic kidney disease, stage 3 (moderate): Secondary | ICD-10-CM

## 2017-07-30 DIAGNOSIS — R748 Abnormal levels of other serum enzymes: Secondary | ICD-10-CM

## 2017-07-30 DIAGNOSIS — J449 Chronic obstructive pulmonary disease, unspecified: Secondary | ICD-10-CM

## 2017-07-30 DIAGNOSIS — R55 Syncope and collapse: Secondary | ICD-10-CM | POA: Diagnosis present

## 2017-07-30 DIAGNOSIS — J9621 Acute and chronic respiratory failure with hypoxia: Secondary | ICD-10-CM | POA: Diagnosis present

## 2017-07-30 DIAGNOSIS — Z7982 Long term (current) use of aspirin: Secondary | ICD-10-CM | POA: Diagnosis not present

## 2017-07-30 DIAGNOSIS — Z87891 Personal history of nicotine dependence: Secondary | ICD-10-CM | POA: Diagnosis not present

## 2017-07-30 DIAGNOSIS — Z9981 Dependence on supplemental oxygen: Secondary | ICD-10-CM | POA: Diagnosis not present

## 2017-07-30 DIAGNOSIS — Z8249 Family history of ischemic heart disease and other diseases of the circulatory system: Secondary | ICD-10-CM | POA: Diagnosis not present

## 2017-07-30 DIAGNOSIS — J9601 Acute respiratory failure with hypoxia: Secondary | ICD-10-CM | POA: Diagnosis not present

## 2017-07-30 DIAGNOSIS — J441 Chronic obstructive pulmonary disease with (acute) exacerbation: Secondary | ICD-10-CM | POA: Diagnosis present

## 2017-07-30 DIAGNOSIS — I130 Hypertensive heart and chronic kidney disease with heart failure and stage 1 through stage 4 chronic kidney disease, or unspecified chronic kidney disease: Secondary | ICD-10-CM | POA: Diagnosis present

## 2017-07-30 DIAGNOSIS — E785 Hyperlipidemia, unspecified: Secondary | ICD-10-CM | POA: Diagnosis present

## 2017-07-30 LAB — CBC
HCT: 30.9 % — ABNORMAL LOW (ref 39.0–52.0)
Hemoglobin: 9 g/dL — ABNORMAL LOW (ref 13.0–17.0)
MCH: 28.4 pg (ref 26.0–34.0)
MCHC: 29.1 g/dL — AB (ref 30.0–36.0)
MCV: 97.5 fL (ref 78.0–100.0)
PLATELETS: 148 10*3/uL — AB (ref 150–400)
RBC: 3.17 MIL/uL — ABNORMAL LOW (ref 4.22–5.81)
RDW: 13.1 % (ref 11.5–15.5)
WBC: 6.6 10*3/uL (ref 4.0–10.5)

## 2017-07-30 LAB — BASIC METABOLIC PANEL
ANION GAP: 11 (ref 5–15)
BUN: 22 mg/dL — AB (ref 6–20)
CALCIUM: 10.9 mg/dL — AB (ref 8.9–10.3)
CO2: 36 mmol/L — AB (ref 22–32)
CREATININE: 1.62 mg/dL — AB (ref 0.61–1.24)
Chloride: 97 mmol/L — ABNORMAL LOW (ref 101–111)
GFR calc Af Amer: 44 mL/min — ABNORMAL LOW (ref >60)
GFR calc non Af Amer: 38 mL/min — ABNORMAL LOW (ref >60)
Glucose, Bld: 101 mg/dL — ABNORMAL HIGH (ref 65–99)
Potassium: 3.9 mmol/L (ref 3.5–5.1)
Sodium: 144 mmol/L (ref 135–145)

## 2017-07-30 LAB — BRAIN NATRIURETIC PEPTIDE: B Natriuretic Peptide: 852 pg/mL — ABNORMAL HIGH (ref 0.0–100.0)

## 2017-07-30 MED ORDER — IPRATROPIUM-ALBUTEROL 0.5-2.5 (3) MG/3ML IN SOLN
3.0000 mL | RESPIRATORY_TRACT | Status: DC
  Administered 2017-07-30 (×2): 3 mL via RESPIRATORY_TRACT
  Filled 2017-07-30 (×2): qty 3

## 2017-07-30 MED ORDER — CLONIDINE HCL 0.2 MG PO TABS
0.2000 mg | ORAL_TABLET | Freq: Once | ORAL | Status: AC
  Administered 2017-07-30: 0.2 mg via ORAL
  Filled 2017-07-30: qty 1

## 2017-07-30 MED ORDER — DOXYCYCLINE HYCLATE 100 MG PO TABS
100.0000 mg | ORAL_TABLET | Freq: Two times a day (BID) | ORAL | Status: DC
  Administered 2017-07-30 – 2017-07-31 (×3): 100 mg via ORAL
  Filled 2017-07-30 (×3): qty 1

## 2017-07-30 MED ORDER — IPRATROPIUM-ALBUTEROL 0.5-2.5 (3) MG/3ML IN SOLN
3.0000 mL | Freq: Four times a day (QID) | RESPIRATORY_TRACT | Status: DC
  Administered 2017-07-31: 3 mL via RESPIRATORY_TRACT
  Filled 2017-07-30: qty 3

## 2017-07-30 MED ORDER — TECHNETIUM TO 99M ALBUMIN AGGREGATED
4.0000 | Freq: Once | INTRAVENOUS | Status: AC | PRN
  Administered 2017-07-30: 3.96 via INTRAVENOUS

## 2017-07-30 MED ORDER — TECHNETIUM TC 99M DIETHYLENETRIAME-PENTAACETIC ACID
30.0000 | Freq: Once | INTRAVENOUS | Status: AC | PRN
  Administered 2017-07-30: 30.5 via RESPIRATORY_TRACT

## 2017-07-30 MED ORDER — METHYLPREDNISOLONE SODIUM SUCC 125 MG IJ SOLR
60.0000 mg | Freq: Four times a day (QID) | INTRAMUSCULAR | Status: DC
  Administered 2017-07-30 – 2017-07-31 (×5): 60 mg via INTRAVENOUS
  Filled 2017-07-30 (×5): qty 2

## 2017-07-30 NOTE — Care Management Note (Signed)
Case Management Note  Patient Details  Name: Erven CollaJohn Krueger MRN: [id number] Date of Birth: 10/03/1934  Subjective/Objective:   COPD/CHF. From home with wife. Ind with ADL's. Has home oxygen. No Home health or DME pta. Still drives. [place] in [place]. Reported to MD that his oxygen wasn't working properly. Discussed with patient, he states that he isn't having tank or concentrator issues but thinks his issues were with CHF/COPD.                 Action/Plan: CM following with needs.   Expected Discharge Date:  08/01/17               Expected Discharge Plan:     In-House Referral:     Discharge planning Services  CM Consult  Post Acute Care Choice:    Choice offered to:     DME Arranged:    DME Agency:     HH Arranged:    HH Agency:     Status of Service:  In process, will continue to follow  If discussed at Long Length of Stay Meetings, dates discussed:    Additional Comments:  Tuesday Terlecki, Chrystine OilerSharley Diane, RN 07/30/2017, 1:02 PM

## 2017-07-30 NOTE — Progress Notes (Signed)
PROGRESS NOTE    Harold Johnson  JYN:[id number]  DOB: April 12, 1934  DOA: 07/29/2017 PCP: Samuel Jester, DO   Brief Admission Hx: Harold Johnson is a 83 y.o. male with medical history significant for COPD on chronic 2 L nasal cannula at home, grade 1 diastolic congestive heart failure with moderate LVH and EF 55 to 60% on echo 06/2017, hypertension, dyslipidemia, and CKD stage III who presented to the emergency department with a presyncopal event where he felt as though he was going to pass out earlier today.  This is in the setting of worsening bilateral lower extremity edema as well as some dyspnea on exertion that has been progressive over the last couple weeks.   MDM/Assessment & Plan:   1. Acute on chronic hypoxemic respiratory failure likely secondary to acute on chronic diastolic CHF and acute COPD exacerbation.  Will check BNP and placed on 40 mg IV Lasix twice daily and monitor daily weights and strict I's and O's.  Try to wean oxygen to baseline and perform ambulatory O2 sat once euvolemic.  Maintain on fluid restriction. 2. Presyncope.  This is likely related to above and patient has had recent echocardiogram on 06/2017, therefore I will not repeat.  I will check carotid Dopplers and orthostatic vitals however.  Placed on fall precautions. 3. Elevated troponin.  I do not think this is significant and 2 sets have remained flat thus far.  Continue to monitor on telemetry with no further troponin at this time. 4. Hypertension.  Continue home medication, but avoid HCTZ.  I will place on hydralazine as needed for significant blood pressure elevations. 5. COPD with chronic hypoxemia and acute exacerbation.  He has very poor air movement, adding steroids, scheduled nebs and antibiotics.  6. Dyslipidemia.  Continue statin.   DVT prophylaxis: Lovenox Code Status: Full Family Communication: None at bedside Disposition Plan: IV diuresis with Lasix for treatment of hypoxemic respiratory failure as  well as orthostatic evaluation and carotid Dopplers for possible near syncope Consults called: None  Subjective: Pt says he continues to be SOB but overall some improvement noted.  He is urinating with the lasix.   Objective: Vitals:   07/29/17 2230 07/29/17 2316 07/30/17 0500 07/30/17 0755  BP: (!) 190/91 (!) 178/98    Pulse: (!) 57 72    Resp: (!) 23 18    Temp:  97.7 F (36.5 C)    TempSrc:  Oral    SpO2: 100% 98%  98%  Weight:  85.9 kg (189 lb 6 oz) 85.9 kg (189 lb 6 oz)   Height:  5\' 9"  (1.753 m)      Intake/Output Summary (Last 24 hours) at 07/30/2017 0843 Last data filed at 07/30/2017 0753 Gross per 24 hour  Intake 3 ml  Output 300 ml  Net -297 ml   Filed Weights   07/29/17 1227 07/29/17 2316 07/30/17 0500  Weight: 92.1 kg (203 lb) 85.9 kg (189 lb 6 oz) 85.9 kg (189 lb 6 oz)     REVIEW OF SYSTEMS  As per history otherwise all reviewed and reported negative  Exam:  General exam: awake, alert, NAD, cooperative.  Respiratory system: Poor air movement bilateral.   No increased work of breathing. Cardiovascular system: S1 & S2 heard. No JVD, No murmurs. Gastrointestinal system: Abdomen is nondistended, soft and nontender. Normal bowel sounds heard. Central nervous system: Alert and oriented. No focal neurological deficits. Extremities: no cyanosis or clubbing.  Data Reviewed: Basic Metabolic Panel: Recent Labs  Lab 07/29/17 1307  07/30/17 0511  NA 140 144  K 3.9 3.9  CL 94* 97*  CO2 34* 36*  GLUCOSE 121* 101*  BUN 22* 22*  CREATININE 1.82* 1.62*  CALCIUM 11.3* 10.9*   Liver Function Tests: Recent Labs  Lab 07/29/17 1307  AST 27  ALT 17  ALKPHOS 52  BILITOT 0.9  PROT 7.5  ALBUMIN 3.9   No results for input(s): LIPASE, AMYLASE in the last 168 hours. No results for input(s): AMMONIA in the last 168 hours. CBC: Recent Labs  Lab 07/29/17 1307 07/30/17 0511  WBC 8.1 6.6  NEUTROABS 4.3  --   HGB 10.0* 9.0*  HCT 33.6* 30.9*  MCV 96.8 97.5    PLT 169 148*   Cardiac Enzymes: Recent Labs  Lab 07/29/17 1307 07/29/17 1657  TROPONINI 0.06* 0.05*   CBG (last 3)  Recent Labs    07/29/17 1230  GLUCAP 123*   No results found for this or any previous visit (from the past 240 hour(s)).   Studies: Dg Chest 2 View  Result Date: 07/29/2017 CLINICAL DATA:  Chronic shortness of breath. EXAM: CHEST - 2 VIEW COMPARISON:  PA and lateral chest 07/02/2017, 06/16/2017 and 01/01/2017. FINDINGS: There is cardiomegaly and vascular congestion. No consolidative process or pneumothorax. Tiny left pleural effusion noted. No acute bony abnormality. IMPRESSION: Cardiomegaly and pulmonary vascular congestion. Tiny left pleural effusion. Electronically Signed   By: Drusilla Kannerhomas  Dalessio M.D.   On: 07/29/2017 14:21   Scheduled Meds: . aspirin EC  81 mg Oral Daily  . atorvastatin  10 mg Oral q1800  . enoxaparin (LOVENOX) injection  40 mg Subcutaneous Q24H  . escitalopram  10 mg Oral Daily  . furosemide  40 mg Intravenous BID  . guaiFENesin  600 mg Oral BID  . hydrALAZINE  25 mg Oral BID  . ipratropium-albuterol  3 mL Nebulization Q4H  . losartan  100 mg Oral Daily  . metoprolol tartrate  25 mg Oral BID  . sodium chloride flush  3 mL Intravenous Q12H   Continuous Infusions: . sodium chloride      Principal Problem:   Acute on chronic respiratory failure with hypoxia (HCC) Active Problems:   Lethargy   Elevated troponin   CKD (chronic kidney disease) stage 3, GFR 30-59 ml/min (HCC)   Essential hypertension   Acute on chronic diastolic CHF (congestive heart failure) (HCC)   COPD (chronic obstructive pulmonary disease) (HCC)   Syncope   Time spent:   Harold Dakinslanford Johnson, MD, FAAFP [facility] Pager [phone]  If 7PM-7AM, please contact night-coverage [email or web address] Password [email or web address] 07/30/2017, 8:43 AM    LOS: 0 days

## 2017-07-30 NOTE — Plan of Care (Signed)
  Problem: Acute Rehab PT Goals(only PT should resolve) Goal: Patient Will Transfer Sit To/From Stand Outcome: Progressing Flowsheets (Taken 07/30/2017 1515) Patient will transfer sit to/from stand: Independently   Problem: Acute Rehab PT Goals(only PT should resolve) Goal: Pt Will Transfer Bed To Chair/Chair To Bed Outcome: Progressing Flowsheets (Taken 07/30/2017 1515) Pt will Transfer Bed to Chair/Chair to Bed: Independently   Problem: Acute Rehab PT Goals(only PT should resolve) Goal: Pt Will Ambulate Outcome: Progressing Flowsheets (Taken 07/30/2017 1515) Pt will Ambulate: with modified independence;100 feet   3:16 PM, 07/30/17 Ocie BobJames Tyeson Tanimoto, MPT Physical Therapist [facility] [phone] office [phone] mobile phone

## 2017-07-30 NOTE — Evaluation (Signed)
Physical Therapy Evaluation Patient Details Name: Harold Johnson MRN: [id number] DOB: 08-04-1934 Today's Date: 07/30/2017   History of Present Illness  Candice Lunney is a 83 y.o. male with medical history significant for COPD on chronic 2 L nasal cannula at home, grade 1 diastolic congestive heart failure with moderate LVH and EF 55 to 60% on echo 06/2017, hypertension, dyslipidemia, and CKD stage III who presented to the emergency department with a presyncopal event where he felt as though he was going to pass out earlier today.  This is in the setting of worsening bilateral lower extremity edema as well as some dyspnea on exertion that has been progressive over the last couple weeks.  EMS had noted oxygen saturation to be 77% on arrival.  She seems to think that his home oxygen equipment may not be adequate for him anymore.  He does not appear to have any symptoms at rest.  He currently lives at home with his wife.    Clinical Impression  Patient functioning near baseline for functional mobility/gait, except becomes slightly unsteady with decreased dynamic balance for ambulation once SOB, requiring standing rest break and instruction in pursed lipped breathing before making it back to bedside.  Patient ambulated on 2 LPM with O2 saturation dropping from 93% to 87% and put back on 3 LPM after therapy - RN notified.  Patient will benefit from continued physical therapy in hospital to increase strength, balance, endurance for safe ADLs and gait.    Follow Up Recommendations No PT follow up    Equipment Recommendations  None recommended by PT    Recommendations for Other Services       Precautions / Restrictions Precautions Precautions: Fall Restrictions Weight Bearing Restrictions: No      Mobility  Bed Mobility Overal bed mobility: Independent                Transfers Overall transfer level: Modified independent                  Ambulation/Gait Ambulation/Gait assistance:  Min guard Ambulation Distance (Feet): 65 Feet Assistive device: None Gait Pattern/deviations: Decreased step length - right;Decreased step length - left;Decreased stride length Gait velocity: decreased   General Gait Details: slightly unsteady once fatigue/SOB, had to take standing rest break due to SOB, on 2 LPM O2 with saturation dropping from 93% to 87%  Stairs            Wheelchair Mobility    Modified Rankin (Stroke Patients Only)       Balance Overall balance assessment: Mild deficits observed, not formally tested                                           Pertinent Vitals/Pain Pain Assessment: No/denies pain    Home Living Family/patient expects to be discharged to:: Private residence Living Arrangements: Spouse/significant other Available Help at Discharge: Family Type of Home: House Home Access: Stairs to enter   Secretary/administrator of Steps: 2 Home Layout: One level Home Equipment: None      Prior Function Level of Independence: Independent         Comments: Tourist information centre manager, drives, 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      Hand Dominance   Dominant Hand: Right    Extremity/Trunk Assessment   Upper Extremity Assessment Upper Extremity Assessment: Overall WFL for tasks assessed    Lower Extremity  Assessment Lower Extremity Assessment: Generalized weakness    Cervical / Trunk Assessment Cervical / Trunk Assessment: Normal  Communication   Communication: No difficulties  Cognition Arousal/Alertness: Awake/alert Behavior During Therapy: WFL for tasks assessed/performed Overall Cognitive Status: Within Functional Limits for tasks assessed                                        General Comments      Exercises     Assessment/Plan    PT Assessment Patient needs continued PT services  PT Problem List Decreased strength;Decreased activity tolerance;Decreased mobility;Decreased balance;Cardiopulmonary status  limiting activity       PT Treatment Interventions Gait training;Stair training;Functional mobility training;Therapeutic activities;Therapeutic exercise;Patient/family education    PT Goals (Current goals can be found in the Care Plan section)  Acute Rehab PT Goals Patient Stated Goal: return home PT Goal Formulation: With patient Time For Goal Achievement: 08/04/17 Potential to Achieve Goals: Good    Frequency Min 3X/week   Barriers to discharge        Co-evaluation               AM-PAC PT "6 Clicks" Daily Activity  Outcome Measure Difficulty turning over in bed (including adjusting bedclothes, sheets and blankets)?: None Difficulty moving from lying on back to sitting on the side of the bed? : None Difficulty sitting down on and standing up from a chair with arms (e.g., wheelchair, bedside commode, etc,.)?: None Help needed moving to and from a bed to chair (including a wheelchair)?: None Help needed walking in hospital room?: A Little Help needed climbing 3-5 steps with a railing? : A Little 6 Click Score: 22    End of Session   Activity Tolerance: Patient tolerated treatment well;Patient limited by fatigue(Patient limited by SOB) Patient left: in bed;with call bell/phone within reach(seated at bedside) Nurse Communication: Mobility status PT Visit Diagnosis: Unsteadiness on feet (R26.81);Other abnormalities of gait and mobility (R26.89);Muscle weakness (generalized) (M62.81)    Time: [id number] PT Time Calculation (min) (ACUTE ONLY): 26 min   Charges:   PT Evaluation $PT Eval Moderate Complexity: 1 Mod PT Treatments $Therapeutic Activity: 23-37 mins   PT G Codes:        3:12 PM, 07/30/17 Ocie BobJames Tiffany Calmes, MPT Physical Therapist [facility] 336 [phone] office [phone] mobile phone

## 2017-07-31 ENCOUNTER — Inpatient Hospital Stay (HOSPITAL_COMMUNITY): Payer: Medicare HMO

## 2017-07-31 DIAGNOSIS — J441 Chronic obstructive pulmonary disease with (acute) exacerbation: Secondary | ICD-10-CM

## 2017-07-31 LAB — BASIC METABOLIC PANEL
Anion gap: 15 (ref 5–15)
BUN: 34 mg/dL — ABNORMAL HIGH (ref 6–20)
CALCIUM: 11.3 mg/dL — AB (ref 8.9–10.3)
CO2: 36 mmol/L — ABNORMAL HIGH (ref 22–32)
Chloride: 87 mmol/L — ABNORMAL LOW (ref 101–111)
Creatinine, Ser: 1.86 mg/dL — ABNORMAL HIGH (ref 0.61–1.24)
GFR, EST AFRICAN AMERICAN: 37 mL/min — AB (ref >60)
GFR, EST NON AFRICAN AMERICAN: 32 mL/min — AB (ref >60)
GLUCOSE: 146 mg/dL — AB (ref 65–99)
Potassium: 4 mmol/L (ref 3.5–5.1)
SODIUM: 138 mmol/L (ref 135–145)

## 2017-07-31 LAB — BRAIN NATRIURETIC PEPTIDE: B Natriuretic Peptide: 525 pg/mL — ABNORMAL HIGH (ref 0.0–100.0)

## 2017-07-31 LAB — MAGNESIUM: MAGNESIUM: 1.7 mg/dL (ref 1.7–2.4)

## 2017-07-31 MED ORDER — IPRATROPIUM-ALBUTEROL 0.5-2.5 (3) MG/3ML IN SOLN: 3.0000 mL | Freq: Four times a day (QID) | RESPIRATORY_TRACT | 0 refills | Status: AC

## 2017-07-31 MED ORDER — PREDNISONE 20 MG PO TABS: ORAL_TABLET | ORAL | 0 refills | Status: AC

## 2017-07-31 MED ORDER — METOPROLOL TARTRATE 37.5 MG PO TABS: 37.5000 mg | ORAL_TABLET | Freq: Two times a day (BID) | ORAL | 0 refills | Status: AC

## 2017-07-31 MED ORDER — METOPROLOL TARTRATE 50 MG PO TABS
50.0000 mg | ORAL_TABLET | Freq: Two times a day (BID) | ORAL | Status: DC
  Administered 2017-07-31: 50 mg via ORAL
  Filled 2017-07-31: qty 1

## 2017-07-31 MED ORDER — DOXYCYCLINE HYCLATE 100 MG PO TABS: 100.0000 mg | ORAL_TABLET | Freq: Two times a day (BID) | ORAL | 0 refills | Status: AC

## 2017-07-31 MED ORDER — HYDRALAZINE HCL 20 MG/ML IJ SOLN: 20.0000 mg | INTRAMUSCULAR | Status: DC | PRN

## 2017-07-31 NOTE — Care Management Note (Signed)
Case Management Note  Patient Details  Name: Erven CollaJohn Raider MRN: [id number] Date of Birth: Dec 17, 1934   Expected Discharge Date:  07/31/17               Expected Discharge Plan:  Home/Self Care  In-House Referral:     Discharge planning Services  CM Consult  Post Acute Care Choice:  NA Choice offered to:  NA  DME Arranged:    DME Agency:     HH Arranged:    HH Agency:     Status of Service:  Completed, signed off  If discussed at [employer] Length of Stay Meetings, dates discussed:    Additional Comments: Patient discharging home today. PT has no recommendations. OT recommends shower seat. Patient not interested due to insurance not covering item. Patient declines HH Charity fundraiserN.  Kadyn Chovan, Chrystine OilerSharley Diane, RN 07/31/2017, 11:04 AM

## 2017-07-31 NOTE — Discharge Summary (Signed)
Physician Discharge Summary  Harold Johnson ZOX:[id number] DOB: [birth year] DOA: 07/29/2017  PCP: Harold Jester, DO  Admit date: 07/29/2017 Discharge date: 07/31/2017  Admitted From: HOME  Disposition: HOME   Recommendations for Outpatient Follow-up:  1. Follow up with PCP in 1 weeks 2. Please follow up on the following pending results: Final Culture Data  Discharge Condition: STABLE   CODE STATUS: FULL    Brief Hospitalization Summary: Please see all hospital notes, images, labs for full details of the hospitalization.  Brief Admission Hx: Harold Hardinis a 83 y.o.malewith medical history significant forCOPD on chronic 2 L nasal cannula at home, grade 1 diastolic congestive heart failure with moderate LVH and EF 55 to 60% on echo 06/2017, hypertension, dyslipidemia, and CKD stage III who presented to the emergency department with a presyncopal event where he felt as though he was going to pass out earlier today. This is in the setting of worsening bilateral lower extremity edema as well as some dyspnea on exertion that has been progressive over the last couple weeks.   MDM/Assessment & Plan:   1. Acute on chronic hypoxemic respiratory failure likely secondary to acute on chronic diastolic CHF and acute COPD exacerbation. He was placed on 40 mg IV Lasix twice daily and monitor daily weights and strict I's and O's. He was also started on treatment for acute COPD Exacerbation.  He has diuresed nearly 1.5L and feeling a lot better.  He feels baseline to go home.  He says that he can manage at home now.  Will discharge on steroid taper and doxycycline and have him follow up with PCP to recheck in 1 week time.  Follow up with his cardiologist outpatient.   2. Presyncope. This is likely related to above and patient has had recent echocardiogram on 06/2017, therefore did not repeat. Ccarotid Dopplers did not show significant stenosis.  3. Elevated troponin. I do not think this is significant and  2 sets have remained flat thus far.Stable telemetry monitoring noted.   4. Poorly controlled Hypertension. Continue home medication, increased metoprolol and follow up with PCP. He was placed on hydralazine as needed for significant blood pressure elevations. 5. COPD with chronic hypoxemia and acute exacerbation.  He has very poor air movement, added steroids, scheduled nebs and antibiotics and he began to improve.  He says he feels better and stable for discharge home.  Outpatient follow up with PCP recommended.  6. Dyslipidemia. Continue statin.  DVT prophylaxis:Lovenox Code Status:Full Family Communication:None at bedside Disposition Plan:Home with close outpatient follow up  Discharge Diagnoses:  Principal Problem:   Acute on chronic respiratory failure with hypoxia (HCC) Active Problems:   Lethargy   Elevated troponin   CKD (chronic kidney disease) stage 3, GFR 30-59 ml/min (HCC)   Essential hypertension   Acute on chronic diastolic CHF (congestive heart failure) (HCC)   COPD (chronic obstructive pulmonary disease) (HCC)   Syncope   Acute respiratory failure with hypoxia (HCC)  Discharge Instructions: Discharge Instructions    (HEART FAILURE PATIENTS) Call MD:  Anytime you have any of the following symptoms: 1) 3 pound weight gain in 24 hours or 5 pounds in 1 week 2) shortness of breath, with or without a dry hacking cough 3) swelling in the hands, feet or stomach 4) if you have to sleep on extra pillows at night in order to breathe.   Complete by:  As directed    Call MD for:  difficulty breathing, headache or visual disturbances   Complete by:  As directed    Call MD for:  extreme fatigue   Complete by:  As directed    Call MD for:  persistant dizziness or light-headedness   Complete by:  As directed    Call MD for:  persistant nausea and vomiting   Complete by:  As directed    Call MD for:  severe uncontrolled pain   Complete by:  As directed    Diet - low  sodium heart healthy   Complete by:  As directed    Increase activity slowly   Complete by:  As directed      Allergies as of 07/31/2017   No Known Allergies     Medication List    TAKE these medications   aspirin EC 81 MG tablet Take 81 mg by mouth daily.   atorvastatin 10 MG tablet Commonly known as:  LIPITOR Take 1 tablet (10 mg total) by mouth daily at 6 PM.   doxycycline 100 MG tablet Commonly known as:  VIBRA-TABS Take 1 tablet (100 mg total) by mouth every 12 (twelve) hours for 7 days.   escitalopram 10 MG tablet Commonly known as:  LEXAPRO Take 10 mg by mouth daily.   guaiFENesin 600 MG 12 hr tablet Commonly known as:  MUCINEX Take 1 tablet (600 mg total) by mouth 2 (two) times daily.   hydrALAZINE 25 MG tablet Commonly known as:  APRESOLINE Take 1 tablet (25 mg total) by mouth 2 (two) times daily.   ipratropium-albuterol 0.5-2.5 (3) MG/3ML Soln Commonly known as:  DUONEB Take 3 mLs by nebulization every 6 (six) hours.   losartan-hydrochlorothiazide 100-25 MG tablet Commonly known as:  HYZAAR Take 1 tablet by mouth daily.   Metoprolol Tartrate 37.5 MG Tabs Take 37.5 mg by mouth 2 (two) times daily. What changed:    medication strength  how much to take   OXYGEN Inhale 3 L into the lungs daily.   polyethylene glycol packet Commonly known as:  MIRALAX / GLYCOLAX Take 17 g by mouth daily as needed for mild constipation.   predniSONE 20 MG tablet Commonly known as:  DELTASONE Take 3 PO QAM x3days, 2 PO QAM x3days, 1 PO QAM x3days Start taking on:  08/01/2017 What changed:    medication strength  how much to take  how to take this  when to take this  additional instructions   VENTOLIN HFA 108 (90 Base) MCG/ACT inhaler Generic drug:  albuterol Inhale 2 puffs into the lungs every 4 (four) hours as needed for wheezing. What changed:  Another medication with the same name was removed. Continue taking this medication, and follow the directions  you see here.      Follow-up Information    Harold Jester, DO. Schedule an appointment as soon as possible for a visit in 1 week(s).   Why:  Hospital follow-up Contact information: [address] [place] [address] [phone]        Jonelle Sidle, MD .   Specialty:  Cardiology Contact information: [address] [place] [address] [phone]          No Known Allergies Allergies as of 07/31/2017   No Known Allergies     Medication List    TAKE these medications   aspirin EC 81 MG tablet Take 81 mg by mouth daily.   atorvastatin 10 MG tablet Commonly known as:  LIPITOR Take 1 tablet (10 mg total) by mouth daily at 6 PM.   doxycycline  100 MG tablet Commonly known as:  VIBRA-TABS Take 1 tablet (100 mg total) by mouth every 12 (twelve) hours for 7 days.   escitalopram 10 MG tablet Commonly known as:  LEXAPRO Take 10 mg by mouth daily.   guaiFENesin 600 MG 12 hr tablet Commonly known as:  MUCINEX Take 1 tablet (600 mg total) by mouth 2 (two) times daily.   hydrALAZINE 25 MG tablet Commonly known as:  APRESOLINE Take 1 tablet (25 mg total) by mouth 2 (two) times daily.   ipratropium-albuterol 0.5-2.5 (3) MG/3ML Soln Commonly known as:  DUONEB Take 3 mLs by nebulization every 6 (six) hours.   losartan-hydrochlorothiazide 100-25 MG tablet Commonly known as:  HYZAAR Take 1 tablet by mouth daily.   Metoprolol Tartrate 37.5 MG Tabs Take 37.5 mg by mouth 2 (two) times daily. What changed:    medication strength  how much to take   OXYGEN Inhale 3 L into the lungs daily.   polyethylene glycol packet Commonly known as:  MIRALAX / GLYCOLAX Take 17 g by mouth daily as needed for mild constipation.   predniSONE 20 MG tablet Commonly known as:  DELTASONE Take 3 PO QAM x3days, 2 PO QAM x3days, 1 PO QAM x3days Start taking on:  08/01/2017 What changed:    medication strength  how much to take  how to take this  when to take  this  additional instructions   VENTOLIN HFA 108 (90 Base) MCG/ACT inhaler Generic drug:  albuterol Inhale 2 puffs into the lungs every 4 (four) hours as needed for wheezing. What changed:  Another medication with the same name was removed. Continue taking this medication, and follow the directions you see here.       Procedures/Studies: Dg Chest 2 View  Result Date: 07/29/2017 CLINICAL DATA:  Chronic shortness of breath. EXAM: CHEST - 2 VIEW COMPARISON:  PA and lateral chest 07/02/2017, 06/16/2017 and 01/01/2017. FINDINGS: There is cardiomegaly and vascular congestion. No consolidative process or pneumothorax. Tiny left pleural effusion noted. No acute bony abnormality. IMPRESSION: Cardiomegaly and pulmonary vascular congestion. Tiny left pleural effusion. Electronically Signed   By: Drusilla Kanner M.D.   On: 07/29/2017 14:21   Dg Chest 2 View  Result Date: 07/02/2017 CLINICAL DATA:  Worsening shortness of breath for 3 weeks. COPD. Congestive heart failure. EXAM: CHEST - 2 VIEW COMPARISON:  06/16/2017 FINDINGS: The heart size and mediastinal contours are within normal limits. Both lungs are clear. The visualized skeletal structures are unremarkable. IMPRESSION: No active cardiopulmonary disease. Electronically Signed   By: Myles Rosenthal M.D.   On: 07/02/2017 19:14   US Carotid Bilateral  Result Date: 07/30/2017 CLINICAL DATA:  Syncope, shortness of breath. Hypertension, coronary artery disease, previous tobacco abuse EXAM: BILATERAL CAROTID DUPLEX ULTRASOUND TECHNIQUE: Wallace Cullens scale imaging, color Doppler and duplex ultrasound was performed of bilateral carotid and vertebral arteries in the neck. COMPARISON:  None. TECHNIQUE: Quantification of carotid stenosis is based on velocity parameters that correlate the residual internal carotid diameter with NASCET-based stenosis levels, using the diameter of the distal internal carotid lumen as the denominator for stenosis measurement. The following  velocity measurements were obtained: PEAK SYSTOLIC/END DIASTOLIC RIGHT ICA:                     78/24cm/sec CCA:                     68/14cm/sec SYSTOLIC ICA/CCA RATIO:  1.14 ECA:  83cm/sec LEFT ICA:                     134/21cm/sec CCA:                     65/11cm/sec SYSTOLIC ICA/CCA RATIO:  2.06 ECA:                     71cm/sec FINDINGS: RIGHT CAROTID ARTERY: Mild nonocclusive plaque in the carotid bulb and proximal ICA. No high-grade stenosis. Normal waveforms and color Doppler signal. RIGHT VERTEBRAL ARTERY:  Normal flow direction and waveform. LEFT CAROTID ARTERY: Intimal thickening in the common carotid artery. Eccentric partially calcified plaque in the bulb at the ICA origin without high-grade stenosis. Normal waveforms and color Doppler signal. LEFT VERTEBRAL ARTERY: Normal flow direction and waveform. IMPRESSION: 1. Mild bilateral carotid bifurcation and proximal ICA plaque resulting in less than 50% diameter stenosis. 2.  Antegrade bilateral vertebral arterial flow. Electronically Signed   By: Corlis Leak  Hassell M.D.   On: 07/30/2017 10:59   Nm Pulmonary Perf And Vent  Result Date: 07/30/2017 CLINICAL DATA:  Shortness of breath and chest pain EXAM: NUCLEAR MEDICINE VENTILATION - PERFUSION LUNG SCAN VIEWS: Anterior, posterior, left lateral, right lateral, RPO, LPO, RAO, LAO-ventilation and perfusion RADIOPHARMACEUTICALS:  30.5 mCi of Tc-1625m DTPA aerosol inhalation and 3.96 mCi Tc2425m-MAA IV COMPARISON:  Chest radiograph July 29, 2017 FINDINGS: Ventilation: There is somewhat decreased ventilation in the left lower lobe compared to other areas in a nonsegmental distribution. There are a few scattered subsegmental ventilation defects elsewhere. Perfusion: Somewhat decreased perfusion is noted in the left lower lobe compared to other areas in a matching distribution with respect to the ventilation study. No segmental perfusion defects are evident. Overall, there is no appreciable  ventilation/perfusion mismatch. IMPRESSION: No segmental perfusion defects or appreciable ventilation/perfusion mismatch. Mild decreased ventilation and perfusion in the left lower lobe compared to other areas is noted. This study by PIOPED II criteria constitutes a low probability of pulmonary embolus. Electronically Signed   By: Bretta BangWilliam  Woodruff III M.D.   On: 07/30/2017 14:09      Subjective: Pt says he feels a lot better, says he feels well enough to manage at home.   Discharge Exam: Vitals:   07/31/17 0630 07/31/17 1018  BP: (!) 179/84   Pulse:    Resp:    Temp:    SpO2:  100%   Vitals:   07/31/17 0312 07/31/17 0534 07/31/17 0630 07/31/17 1018  BP:  (!) 204/104 (!) 179/84   Pulse:  64    Resp:  20    Temp:  98.4 F (36.9 C)    TempSrc:  Oral    SpO2:  100%  100%  Weight: 83.3 kg (183 lb 10.3 oz)     Height:        General: Pt is alert, awake, not in acute distress Cardiovascular: RRR, S1/S2 +, no rubs, no gallops Respiratory: better air movement bilateral, no wheezing, no rhonchi Abdominal: Soft, NT, ND, bowel sounds + Extremities: no edema, no cyanosis   The results of significant diagnostics from this hospitalization (including imaging, microbiology, ancillary and laboratory) are listed below for reference.     Microbiology: No results found for this or any previous visit (from the past 240 hour(s)).   Labs: BNP (last 3 results) Recent Labs    07/02/17 1823 07/30/17 0511 07/31/17 0545  BNP 395.0* 852.0* 525.0*   Basic Metabolic Panel: Recent Labs  Lab 07/29/17 1307 07/30/17 0511 07/31/17 0545  NA 140 144 138  K 3.9 3.9 4.0  CL 94* 97* 87*  CO2 34* 36* 36*  GLUCOSE 121* 101* 146*  BUN 22* 22* 34*  CREATININE 1.82* 1.62* 1.86*  CALCIUM 11.3* 10.9* 11.3*  MG  --   --  1.7   Liver Function Tests: Recent Labs  Lab 07/29/17 1307  AST 27  ALT 17  ALKPHOS 52  BILITOT 0.9  PROT 7.5  ALBUMIN 3.9   No results for input(s): LIPASE, AMYLASE in  the last 168 hours. No results for input(s): AMMONIA in the last 168 hours. CBC: Recent Labs  Lab 07/29/17 1307 07/30/17 0511  WBC 8.1 6.6  NEUTROABS 4.3  --   HGB 10.0* 9.0*  HCT 33.6* 30.9*  MCV 96.8 97.5  PLT 169 148*   Cardiac Enzymes: Recent Labs  Lab 07/29/17 1307 07/29/17 1657  TROPONINI 0.06* 0.05*   BNP: Invalid input(s): POCBNP CBG: Recent Labs  Lab 07/29/17 1230  GLUCAP 123*   D-Dimer Recent Labs    07/29/17 1323  DDIMER 0.82*   Hgb A1c No results for input(s): HGBA1C in the last 72 hours. Lipid Profile No results for input(s): CHOL, HDL, LDLCALC, TRIG, CHOLHDL, LDLDIRECT in the last 72 hours. Thyroid function studies No results for input(s): TSH, T4TOTAL, T3FREE, THYROIDAB in the last 72 hours.  Invalid input(s): FREET3 Anemia work up No results for input(s): VITAMINB12, FOLATE, FERRITIN, TIBC, IRON, RETICCTPCT in the last 72 hours. Urinalysis    Component Value Date/Time   COLORURINE YELLOW 10/12/2011 1019   APPEARANCEUR CLEAR 10/12/2011 1019   LABSPEC >1.030 (H) 10/12/2011 1019   PHURINE 6.0 10/12/2011 1019   GLUCOSEU NEGATIVE 10/12/2011 1019   HGBUR LARGE (A) 10/12/2011 1019   BILIRUBINUR NEGATIVE 10/12/2011 1019   KETONESUR NEGATIVE 10/12/2011 1019   PROTEINUR 100 (A) 10/12/2011 1019   UROBILINOGEN 0.2 10/12/2011 1019   NITRITE NEGATIVE 10/12/2011 1019   LEUKOCYTESUR NEGATIVE 10/12/2011 1019   Sepsis Labs Invalid input(s): PROCALCITONIN,  WBC,  LACTICIDVEN Microbiology No results found for this or any previous visit (from the past 240 hour(s)).  Time coordinating discharge: 32 mins  SIGNED:  Standley Dakins, MD  [facility] 07/31/2017, 10:56 AM Pager [phone]  If 7PM-7AM, please contact night-coverage [email or web address] Password [email or web address]

## 2017-07-31 NOTE — Discharge Instructions (Signed)
Seek medical care or return to emergency room if symptoms recur, worsen or new problems develop.  Please make sure and see your primary care physician as soon as possible to be rechecked.  Please take medications as prescribed.   Follow with Primary MD  Samuel JesterButler, Cynthia, DO  and other consultants as instructed your Hospitalist MD  Please get a complete blood count and chemistry panel checked by your Primary MD at your next visit, and again as instructed by your Primary MD.  Get Medicines reviewed and adjusted: Please take all your medications with you for your next visit with your Primary MD  Laboratory/radiological data: Please request your Primary MD to go over all hospital tests and procedure/radiological results at the follow up, please ask your Primary MD to get all Hospital records sent to his/her office.  In some cases, they will be blood work, cultures and biopsy results pending at the time of your discharge. Please request that your primary care M.D. follows up on these results.  Also Note the following: If you experience worsening of your admission symptoms, develop shortness of breath, life threatening emergency, suicidal or homicidal thoughts you must seek medical attention immediately by calling 911 or calling your MD immediately  if symptoms less severe.  You must read complete instructions/literature along with all the possible adverse reactions/side effects for all the Medicines you take and that have been prescribed to you. Take any new Medicines after you have completely understood and accpet all the possible adverse reactions/side effects.   Do not drive when taking Pain medications or sleeping medications (Benzodaizepines)  Do not take more than prescribed Pain, Sleep and Anxiety Medications. It is not advisable to combine anxiety,sleep and pain medications without talking with your primary care practitioner  Special Instructions: If you have smoked or chewed Tobacco  in  the last 2 yrs please stop smoking, stop any regular Alcohol  and or any Recreational drug use.  Wear Seat belts while driving.  Please note: You were cared for by a hospitalist during your hospital stay. Once you are discharged, your primary care physician will handle any further medical issues. Please note that NO REFILLS for any discharge medications will be authorized once you are discharged, as it is imperative that you return to your primary care physician (or establish a relationship with a primary care physician if you do not have one) for your post hospital discharge needs so that they can reassess your need for medications and monitor your lab values.      Chronic Obstructive Pulmonary Disease Exacerbation Chronic obstructive pulmonary disease (COPD) is a common lung problem. In COPD, the flow of air from the lungs is limited. COPD exacerbations are times that breathing gets worse and you need extra treatment. Without treatment they can be life threatening. If they happen often, your lungs can become more damaged. If your COPD gets worse, your doctor may treat you with:  Medicines.  Oxygen.  Different ways to clear your airway, such as using a mask.  Follow these instructions at home:  Do not smoke.  Avoid tobacco smoke and other things that bother your lungs.  If given, take your antibiotic medicine as told. Finish the medicine even if you start to feel better.  Only take medicines as told by your doctor.  Drink enough fluids to keep your pee (urine) clear or pale yellow (unless your doctor has told you not to).  Use a cool mist machine (vaporizer).  If you use oxygen  or a machine that turns liquid medicine into a mist (nebulizer), continue to use them as told.  Keep up with shots (vaccinations) as told by your doctor.  Exercise regularly.  Eat healthy foods.  Keep all doctor visits as told. Get help right away if:  You are very short of breath and it gets  worse.  You have trouble talking.  You have bad chest pain.  You have blood in your spit (sputum).  You have a fever.  You keep throwing up (vomiting).  You feel weak, or you pass out (faint).  You feel confused.  You keep getting worse. This information is not intended to replace advice given to you by your health care provider. Make sure you discuss any questions you have with your health care provider. Document Released: 03/13/2011 Document Revised: 08/30/2015 Document Reviewed: 11/26/2012 Elsevier Interactive Patient Education  2017 Elsevier Inc.   Cough, Adult A cough helps to clear your throat and lungs. A cough may last only 2-3 weeks (acute), or it may last longer than 8 weeks (chronic). Many different things can cause a cough. A cough may be a sign of an illness or another medical condition. Follow these instructions at home:  Pay attention to any changes in your cough.  Take medicines only as told by your doctor. ? If you were prescribed an antibiotic medicine, take it as told by your doctor. Do not stop taking it even if you start to feel better. ? Talk with your doctor before you try using a cough medicine.  Drink enough fluid to keep your pee (urine) clear or pale yellow.  If the air is dry, use a cold steam vaporizer or humidifier in your home.  Stay away from things that make you cough at work or at home.  If your cough is worse at night, try using extra pillows to raise your head up higher while you sleep.  Do not smoke, and try not to be around smoke. If you need help quitting, ask your doctor.  Do not have caffeine.  Do not drink alcohol.  Rest as needed. Contact a doctor if:  You have new problems (symptoms).  You cough up yellow fluid (pus).  Your cough does not get better after 2-3 weeks, or your cough gets worse.  Medicine does not help your cough and you are not sleeping well.  You have pain that gets worse or pain that is not helped  with medicine.  You have a fever.  You are losing weight and you do not know why.  You have night sweats. Get help right away if:  You cough up blood.  You have trouble breathing.  Your heartbeat is very fast. This information is not intended to replace advice given to you by your health care provider. Make sure you discuss any questions you have with your health care provider. Document Released: 12/05/2010 Document Revised: 08/30/2015 Document Reviewed: 05/31/2014 Elsevier Interactive Patient Education  2018 [employer].   COPD Action Plan Introduction A COPD action plan is a description of what to do when you have a flare (exacerbation) of chronic obstructive pulmonary disease (COPD). Your action plan is a color-coded plan that lists the symptoms that indicate whether or not your condition is under control and what actions to take.  If you have symptoms in the green zone, it means you are doing well that day.  If you have symptoms in the yellow zone, it means you are having a bad day  or an exacerbation.  If you have symptoms in the red zone, you need urgent medical care.  Follow the plan you and your health care provider developed. Review your plan with your health care provider at each visit. Red zone  Symptoms in this zone mean that you should get medical help right away. They include:  Feeling very short of breath, even when you are resting.  Not being able to do any activities because of poor breathing.  Not being able to sleep because of poor breathing.  Fever or shaking chills.  Feeling confused or very sleepy.  Chest pain.  Coughing up blood.  If you have any of these symptoms, call emergency services (911 in the [place] or go to the nearest emergency room. Yellow zone  Symptoms in this zone mean that your condition may be getting worse. They include:  Feeling more short of breath than usual.  Having less energy for daily activities than  usual.  Phlegm or mucus that is thicker than usual.  Needing to use your rescue inhaler or nebulizer more often than usual.  More ankle swelling than usual.  Coughing more than usual.  Feeling like you have a chest cold.  Trouble sleeping due to COPD symptoms.  Decreased appetite.  COPD medicines not helping as much as usual.  If you experience any "yellow" symptoms:  Keep taking your daily medicines as directed.  Use your quick-relief inhaler as told by your health care provider.  If you were prescribed steroid medicine to take by mouth (oralmedicine), start taking it as told by your health care provider.  If you were prescribed an antibiotic, start taking it as told by your health care provider. Do not stop taking the antibiotic even if you start to feel better.  Use oxygen as told by your health care provider.  Get more rest.  Do your pursed-lip breathing exercises.  Do not smoke. Avoid any irritants in the air.  If your signs and symptoms do not improve after taking these steps, call your health care provider right away. Green zone  Symptoms in this zone mean that you are doing well. They include:  Being able to do your usual activities and exercise.  Having the usual amount of coughing, including the same amount of phlegm or mucus.  Being able to sleep well.  Having a good appetite.  Follow these instructions at home:  Continue taking your daily medicines as told by your health care provider.  Make sure you receive all the immunizations that your health care provider recommends, especially the pneumococcal and influenza vaccines.  Wash your hands often with soap and water. Have family members wash their hands too. Regular hand washing can help prevent infections.  Follow your usual exercise and diet plan.  Avoid irritants in the air, such as smoke.  Do not use any products that contain nicotine or tobacco, such as cigarettes and e-cigarettes. If you  need help quitting, ask your health care provider. Where to find more information: You can find more information about COPD from:  American Lung Association, My COPD Action Plan: [place].pdf  COPD Foundation: www.copdfoundation.org  National Heart, Lung, & Blood Institute: InsuranceSquad.es  This information is not intended to replace advice given to you by your health care provider. Make sure you discuss any questions you have with your health care provider. Document Released: 08/06/2016 Document Revised: 08/06/2016 Document Reviewed: 08/06/2016 Elsevier Interactive Patient Education  2018 Elsevier Inc.   Pursed Lip Breathing Pursed lip breathing  is a technique to relieve the feeling of being short of breath. Some long-term respiratory conditions, like chronic obstructive pulmonary disease (COPD) and severe asthma, can make it hard to breathe out (exhale) all of the air in your lungs. This can make air that has less oxygen than normal build up in your lungs (air trapping). Trapped air means your lungs fill with less fresh air when you breathe in (inhale). As a result, you feel short of breath. Pursed lip breathing keeps your airways open longer when you exhale and empties more air from your lungs. This makes more space for fresh air when you inhale. Pursed lip breathing can also slow down your breathing and help your body not have to work so hard to breathe. Over time, pursed lip breathing may help you be able to be more physically active and do more activities. How to perform pursed lip breathing Being short of breath can make you tense and anxious. Before you start this breathing exercise, take a minute to relax your shoulders and close your eyes. Then: 1. Start the exercise by closing your mouth. 2. Breathe in through your nose, taking a normal breath. You can do this at your normal rate of breathing. If you feel you are  not getting enough air, breathe in while slowly counting to 2 or 3. 3. Pucker (purse) your lips as if you were going to whistle. 4. Gently tighten your abdomen muscles or press on your belly to help push the air out. 5. Breathe out slowly through your pursed lips. Take at least twice as long to breathe out as it takes you to breathe in. 6. Make sure that you breathe out all of the air, but do not force air out. 7. Repeat the exercise until your breathing improves. Ask your health care provider how often and how long to do this exercise.  Follow these instructions at home:  Take over-the-counter and prescription medicines only as told by your health care provider.  Return to your normal activities as told by your health care provider. Ask your health care provider what activities are safe for you.  Do not use any products that contain nicotine or tobacco, such as cigarettes and e-cigarettes. If you need help quitting, ask your health care provider.  Keep all follow-up visits as told by your health care provider. This is important. Contact a health care provider if:  Your shortness of breath gets worse.  You become less able to exercise or be active.  You develop a cough.  You develop a fever. Get help right away if:  You are struggling to breathe.  Your shortness of breath prevents you from engaging in any activity. Summary  Pursed lip breathing is a breathing technique that helps to remove trapped air from your lungs. It helps you get more oxygen into your lungs and makes your body have to work less hard to breathe.  Pursed lip breathing can gradually make you more able to be physically active.  You can do pursed lip breathing on your own at home.  Ask your health care provider how often and how long you should do pursed lip breathing. This information is not intended to replace advice given to you by your health care provider. Make sure you discuss any questions you have with  your health care provider. Document Released: 01/01/2008 Document Revised: 02/14/2016 Document Reviewed: 02/14/2016 Elsevier Interactive Patient Education  2017 [employer].   Shortness of Breath, Adult Shortness of breath means  you have trouble breathing. Your lungs are organs for breathing. Follow these instructions at home: Pay attention to any changes in your symptoms. Take these actions to help with your condition:  Do not smoke. Smoking can cause shortness of breath. If you need help to quit smoking, ask your doctor.  Avoid things that can make it harder to breathe, such as: ? Mold. ? Dust. ? Air pollution. ? Chemical smells. ? Things that can cause allergy symptoms (allergens), if you have allergies.  Keep your living space clean and free of mold and dust.  Rest as needed. Slowly return to your usual activities.  Take over-the-counter and prescription medicines, including oxygen and inhaled medicines, only as told by your doctor.  Keep all follow-up visits as told by your doctor. This is important.  Contact a doctor if:  Your condition does not get better as soon as expected.  You have a hard time doing your normal activities, even after you rest.  You have new symptoms. Get help right away if:  You have trouble breathing when you are resting.  You feel light-headed or you faint.  You have a cough that is not helped by medicines.  You cough up blood.  You have pain with breathing.  You have pain in your chest, arms, shoulders, or belly (abdomen).  You have a fever.  You cannot walk up stairs.  You cannot exercise the way you normally do. This information is not intended to replace advice given to you by your health care provider. Make sure you discuss any questions you have with your health care provider. Document Released: 09/10/2007 Document Revised: 04/10/2016 Document Reviewed: 04/10/2016 Elsevier Interactive Patient Education  2017 [employer].

## 2017-07-31 NOTE — Evaluation (Signed)
Occupational Therapy Evaluation Patient Details Name: Harold Johnson MRN: [id number] DOB: [birth year] Today's Date: 07/31/2017    History of Present Illness Harold Johnson is a 83 y.o. male with medical history significant for COPD on chronic 2 L nasal cannula at home, grade 1 diastolic congestive heart failure with moderate LVH and EF 55 to 60% on echo 06/2017, hypertension, dyslipidemia, and CKD stage III who presented to the emergency department with a presyncopal event where he felt as though he was going to pass out earlier today.  This is in the setting of worsening bilateral lower extremity edema as well as some dyspnea on exertion that has been progressive over the last couple weeks.  EMS had noted oxygen saturation to be 77% on arrival.  She seems to think that his home oxygen equipment may not be adequate for him anymore.  He does not appear to have any symptoms at rest.  He currently lives at home with his wife.   Clinical Impression   Pt received supine in bed, agreeable to OT evaluation. Pt is independent in ADLs, increased time required for SOB. Pt provided with informational handout and educated on energy conservation strategies to implement during daily tasks for reducing fatigue and improving functioning during ADLs. Pt mainly concerned about having oxygen to go home with. No further OT services required as pt it functioning at baseline with ADLs.     Follow Up Recommendations  No OT follow up    Equipment Recommendations  Tub/shower seat       Precautions / Restrictions Precautions Precautions: Fall Restrictions Weight Bearing Restrictions: No      Mobility Bed Mobility Overal bed mobility: Independent                Transfers Overall transfer level: Modified independent                        ADL either performed or assessed with clinical judgement   ADL Overall ADL's : Modified independent;At baseline                                       General ADL Comments: Increased time for SOB     Vision Baseline Vision/History: Wears glasses Wears Glasses: At all times Patient Visual Report: No change from baseline Vision Assessment?: No apparent visual deficits            Pertinent Vitals/Pain Pain Assessment: No/denies pain     Hand Dominance Right   Extremity/Trunk Assessment Upper Extremity Assessment Upper Extremity Assessment: Overall WFL for tasks assessed   Lower Extremity Assessment Lower Extremity Assessment: Defer to PT evaluation   Cervical / Trunk Assessment Cervical / Trunk Assessment: Normal   Communication Communication Communication: No difficulties   Cognition Arousal/Alertness: Awake/alert Behavior During Therapy: WFL for tasks assessed/performed Overall Cognitive Status: Within Functional Limits for tasks assessed                                                Home Living Family/patient expects to be discharged to:: Private residence Living Arrangements: Spouse/significant other Available Help at Discharge: Family Type of Home: House Home Access: Stairs to enter Secretary/administrator of Steps: 2   Home Layout: One level     Bathroom  Shower/Tub: Research scientist (medical)Tub/shower unit     Bathroom Accessibility: Yes   Home Equipment: None          Prior Functioning/Environment Level of Independence: Independent        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

## 2017-09-05 DEATH — deceased

## 2019-02-22 IMAGING — DX DG CHEST 2V
2 series · 2 of 2 positions shown · non-contrast
Comparison: 10/23/2011

CLINICAL DATA: Sudden onset discordant of breath with wheezing.
Productive cough and left-sided chest pain 2 weeks. Hypoxia.

EXAM:
CHEST  2 VIEW

[chest pa]
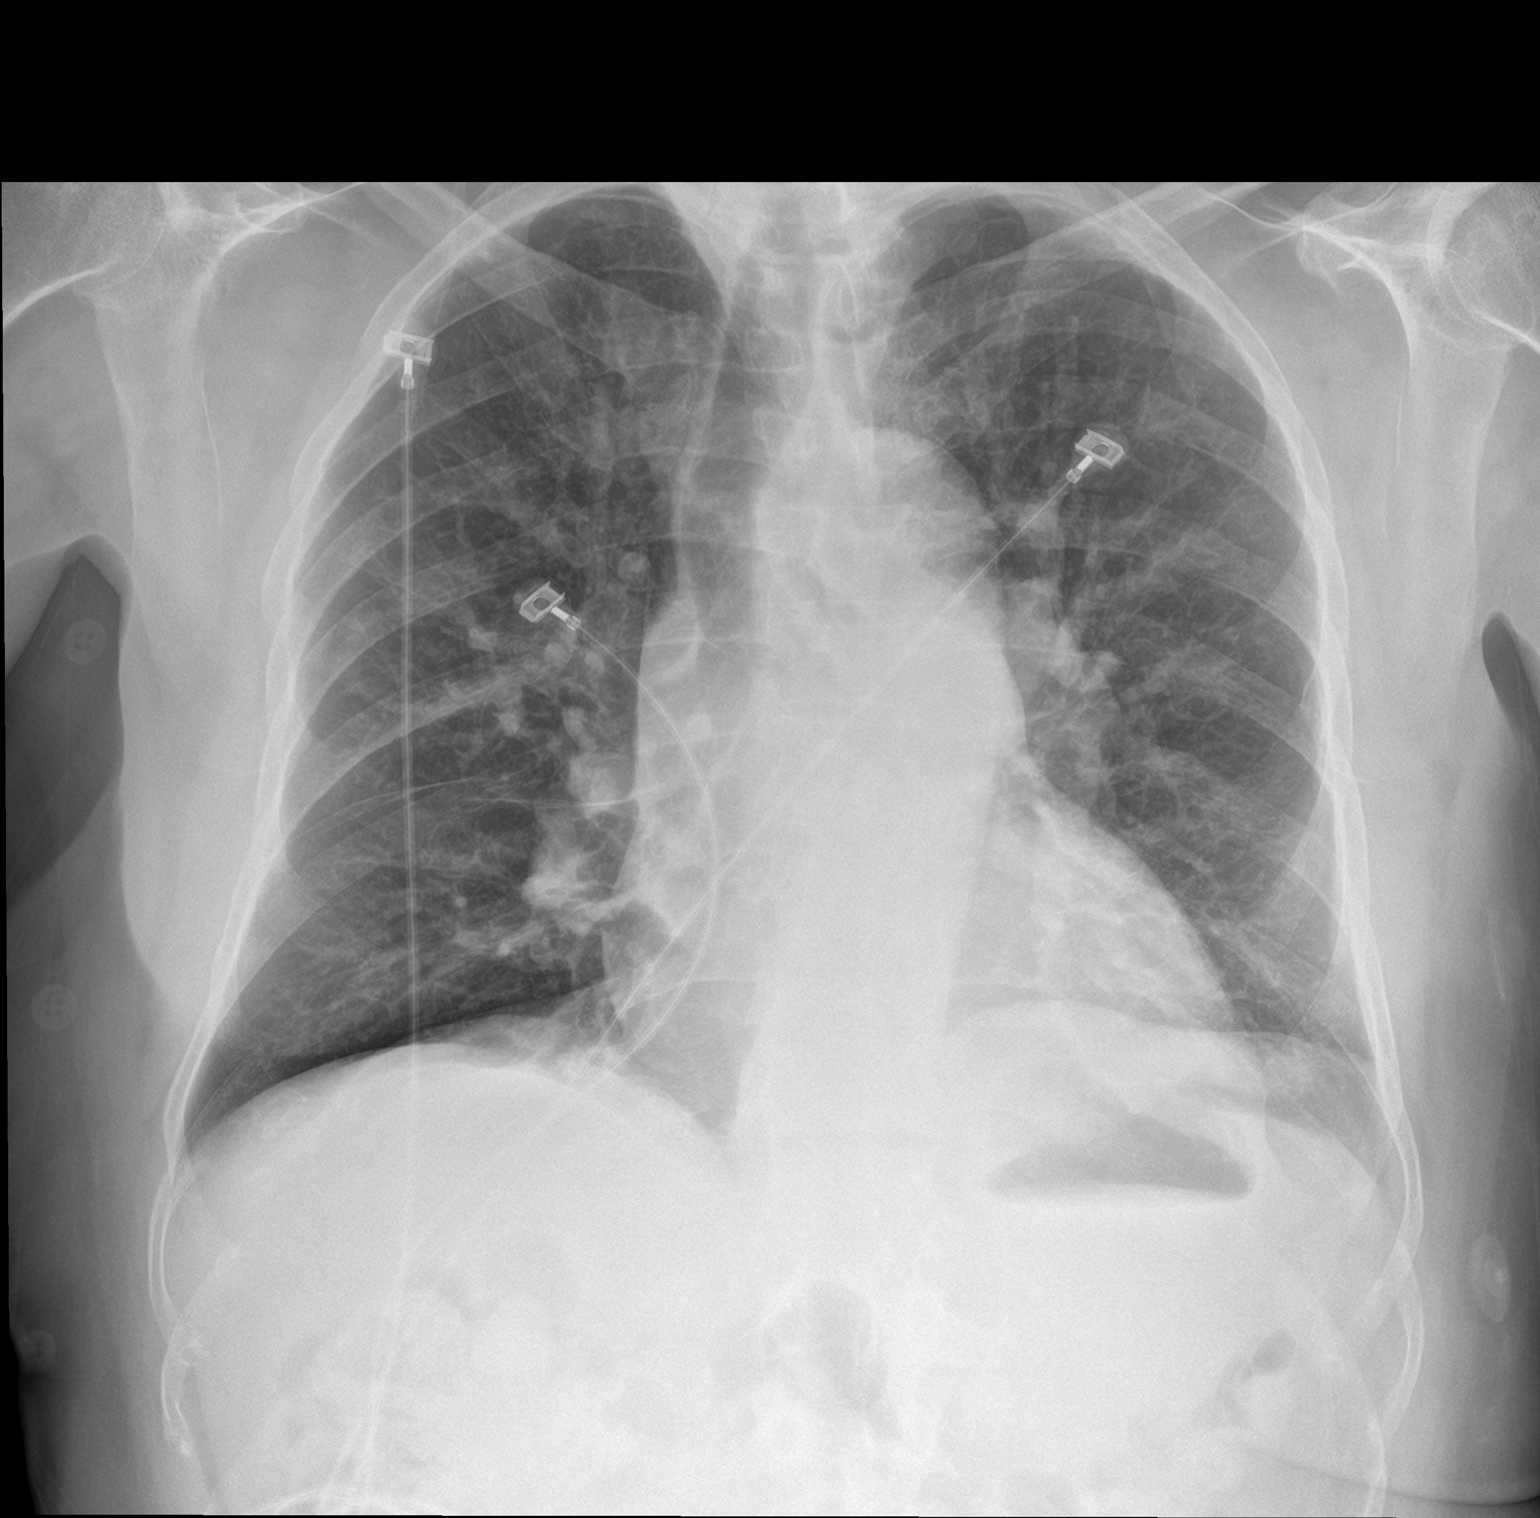

[chest lat]
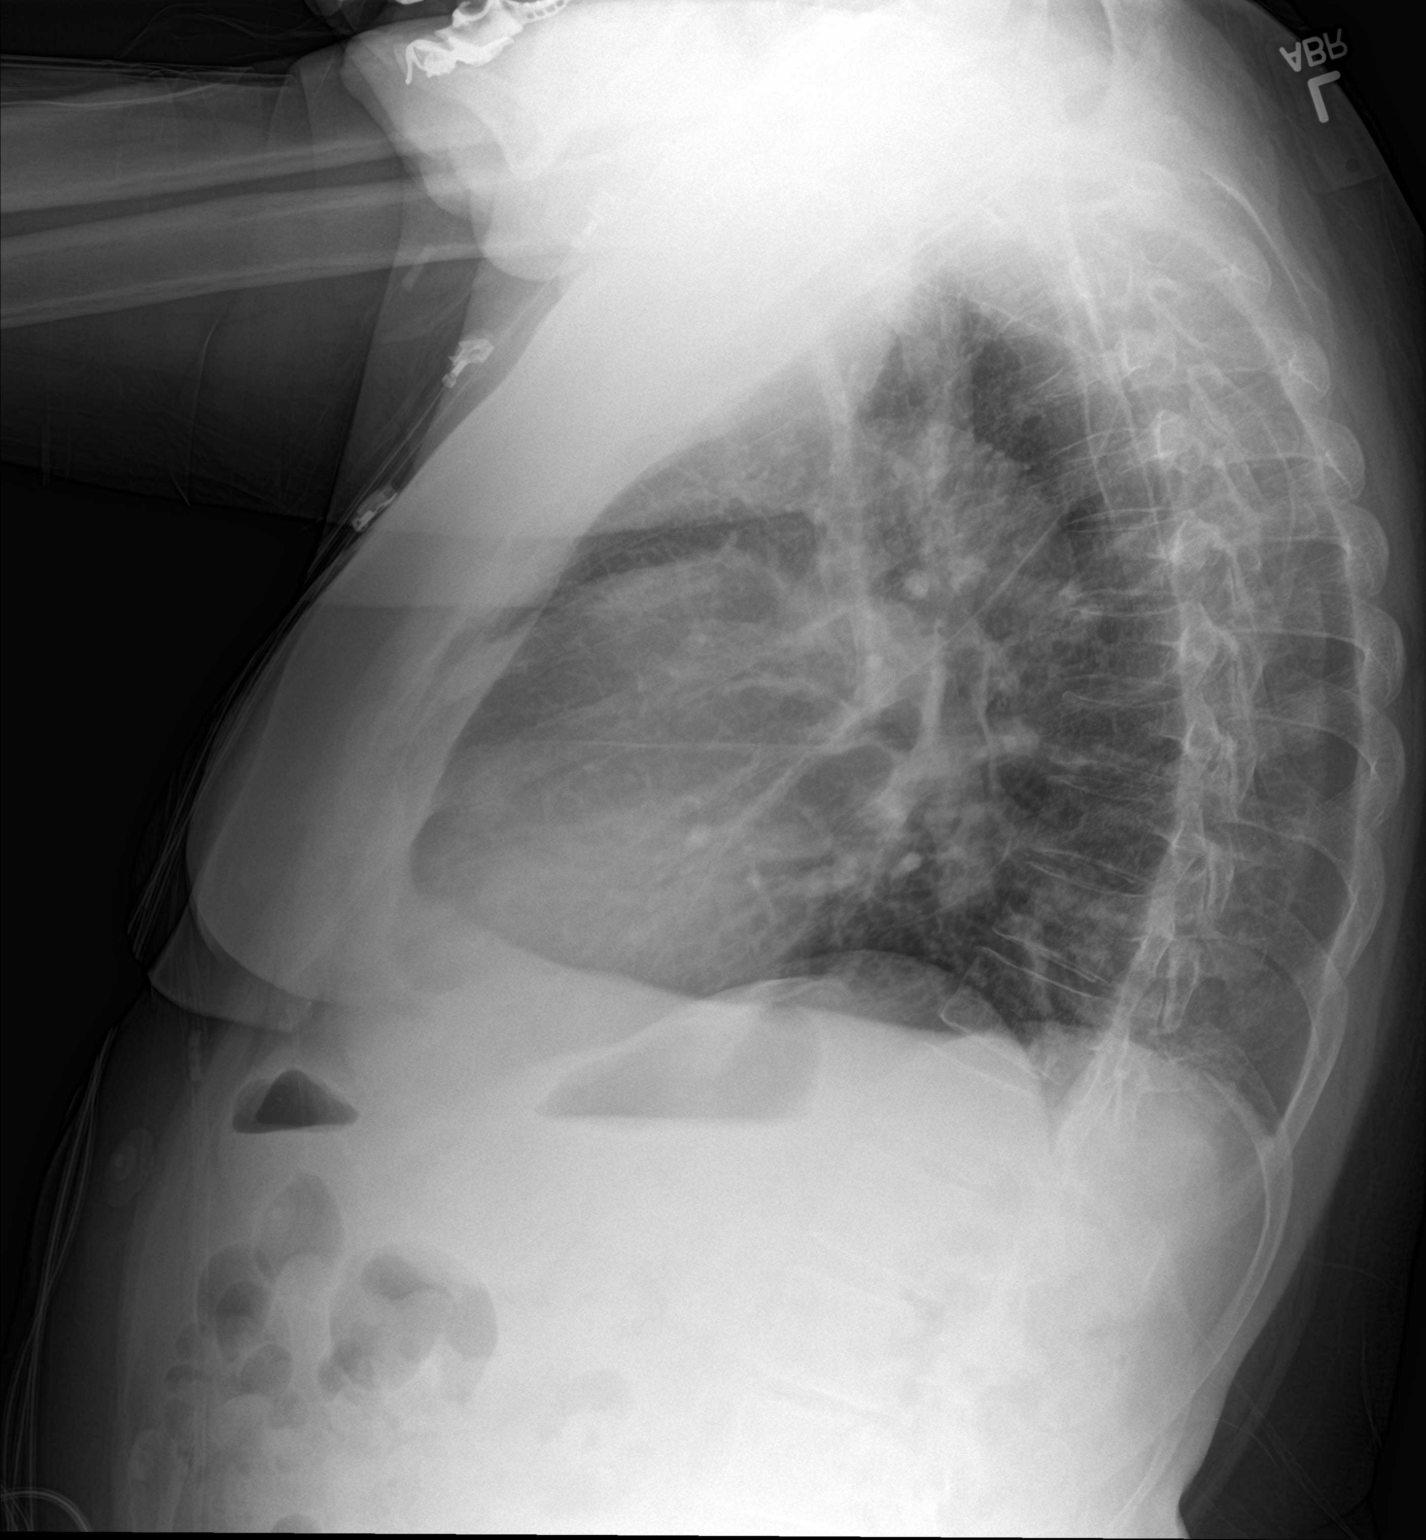

[2 of 2 positions shown; findings below may reference images not displayed]

FINDINGS: Lungs are adequately inflated with possible mild opacification over
the anterior upper lobes on the lateral film which may be due to
atelectasis or infection. No evidence of effusion. Cardiomediastinal
silhouette and remainder of the exam is unchanged.
IMPRESSION: Possible mild opacification over the anterior upper lungs on the
lateral film which may be due to atelectasis or infection.

## 2020-02-23 IMAGING — US US ABDOMEN LIMITED
1 series · 14 of 25 positions shown · non-contrast
Comparison: None.

CLINICAL DATA: Unspecified disorder of liver function.
Transaminasemia.

EXAM:
ULTRASOUND ABDOMEN LIMITED RIGHT UPPER QUADRANT

[Series 1: us abdomen limited · 0.18mm/px · 14 of 53 slices shown]
[im 1/53]
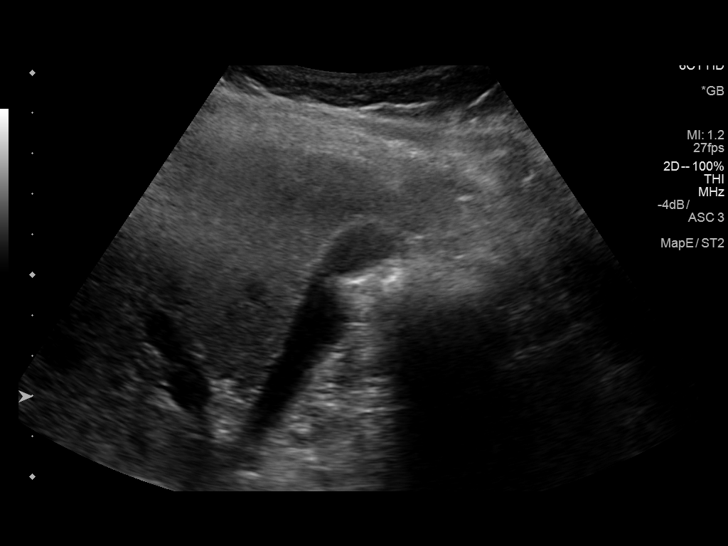
[im 5/53]
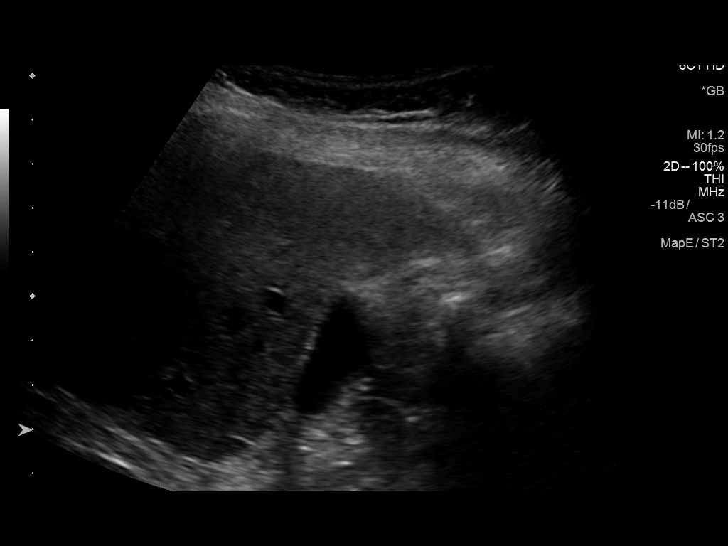
[im 9/53]
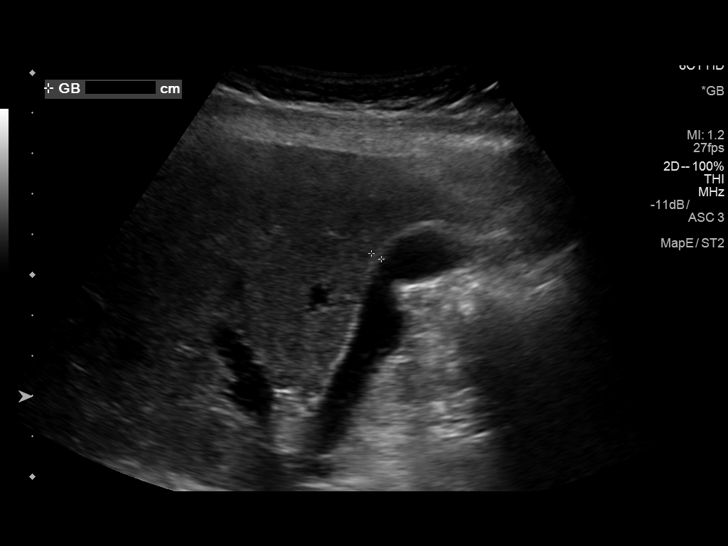
[im 14/53]
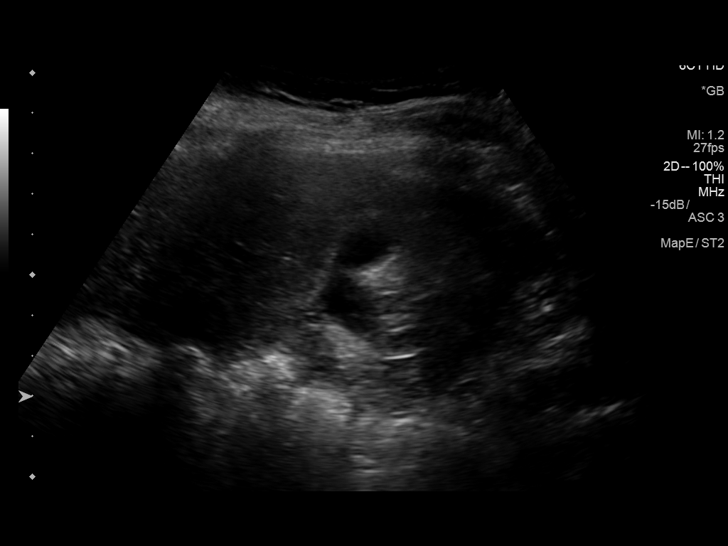
[im 18/53]
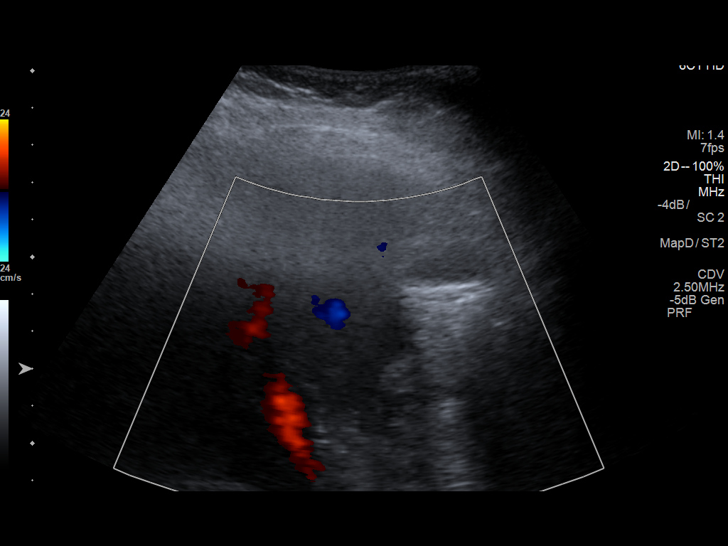
[im 20/53]
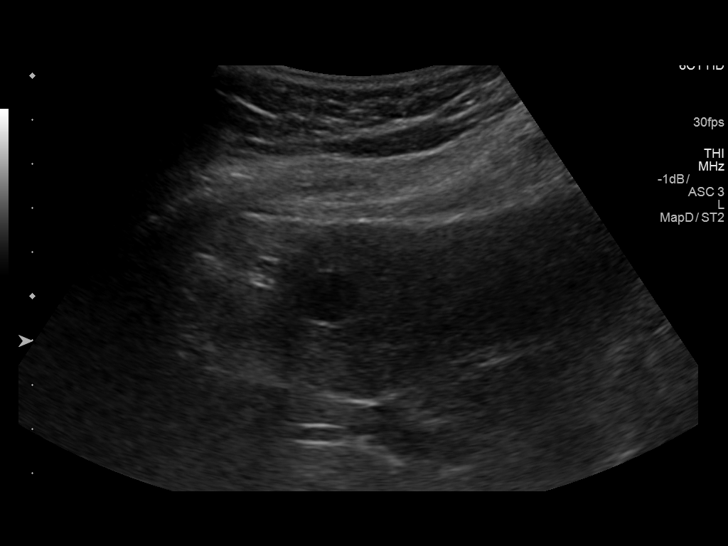
[im 24/53]
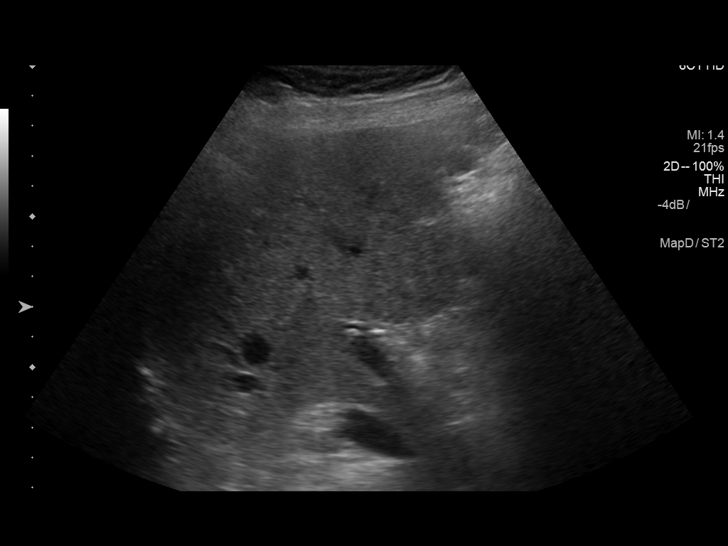
[im 29/53]
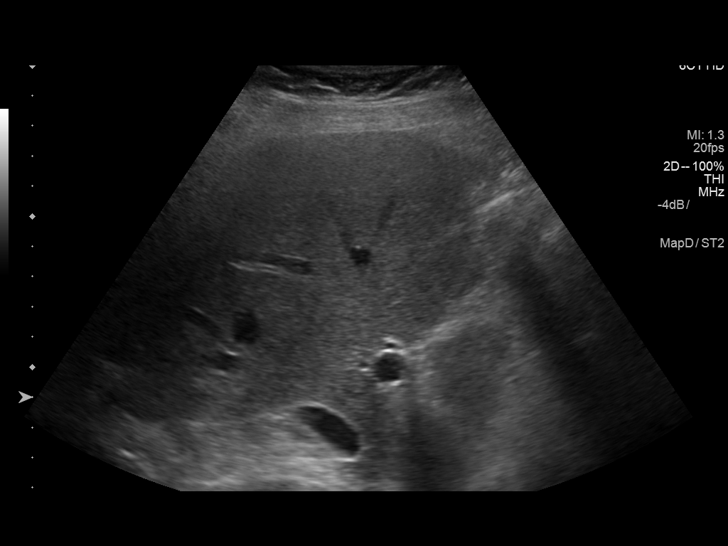
[im 33/53]
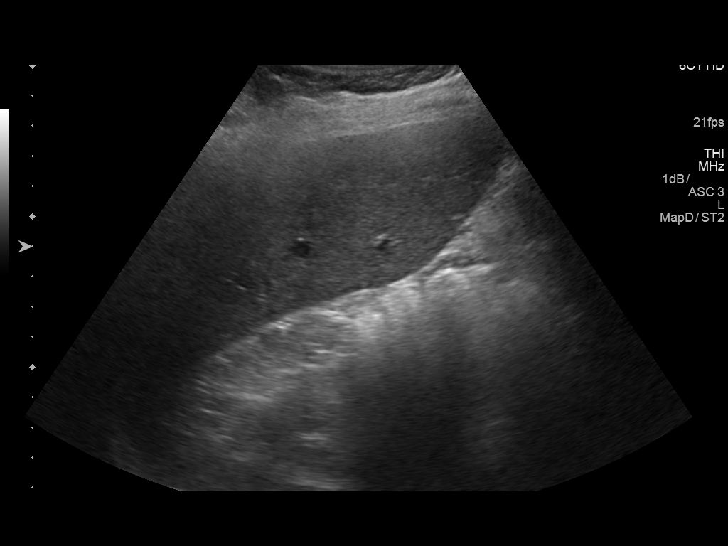
[im 35/53]
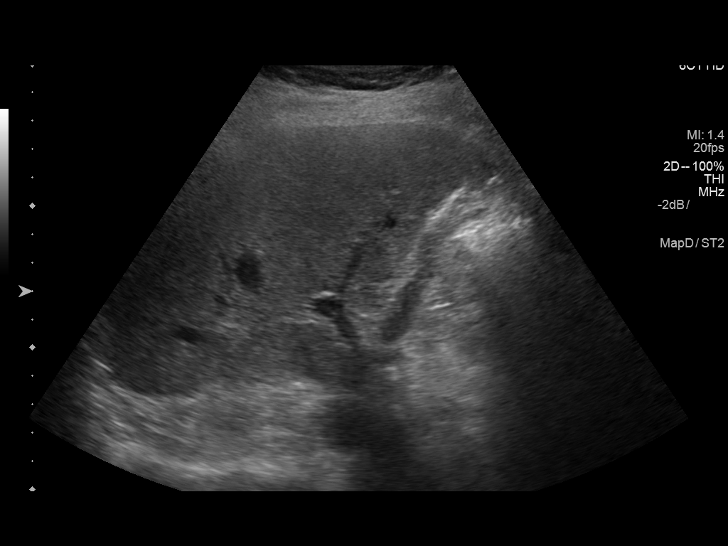
[im 40/53]
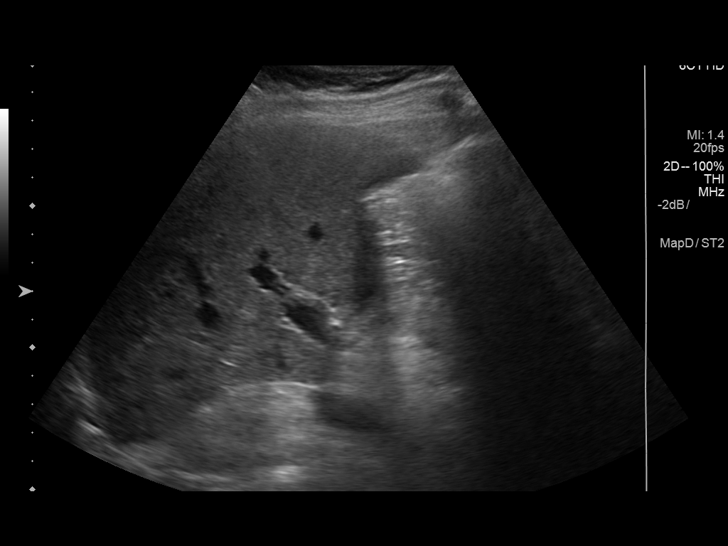
[im 44/53]
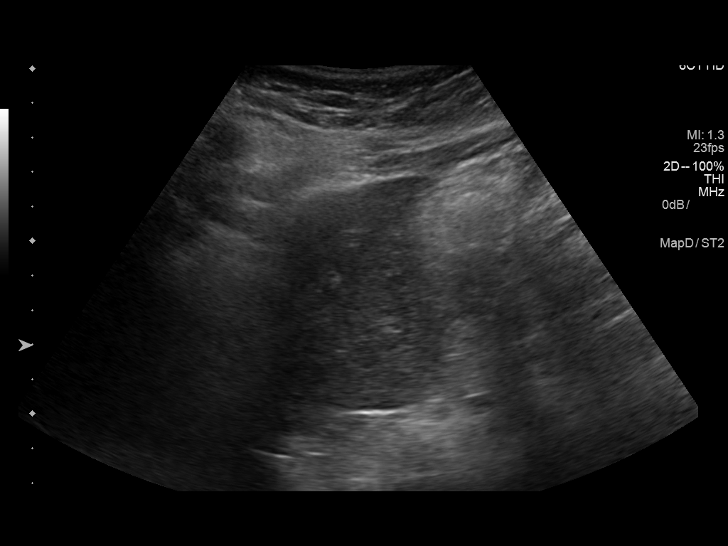
[im 48/53]
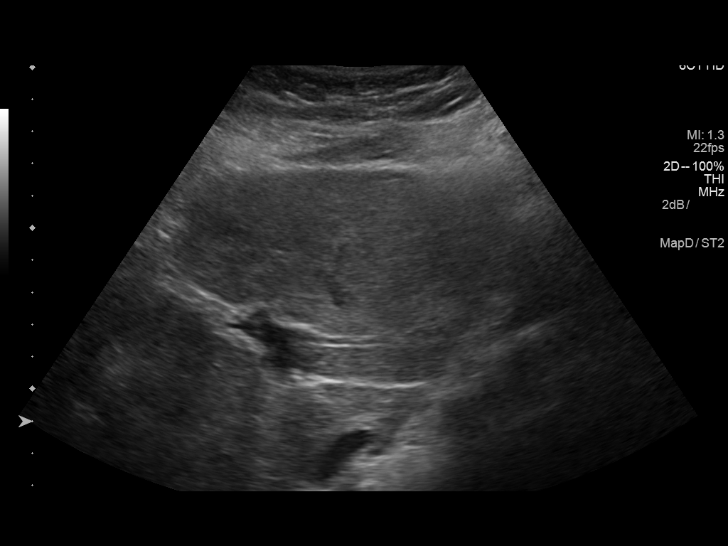
[im 53/53]
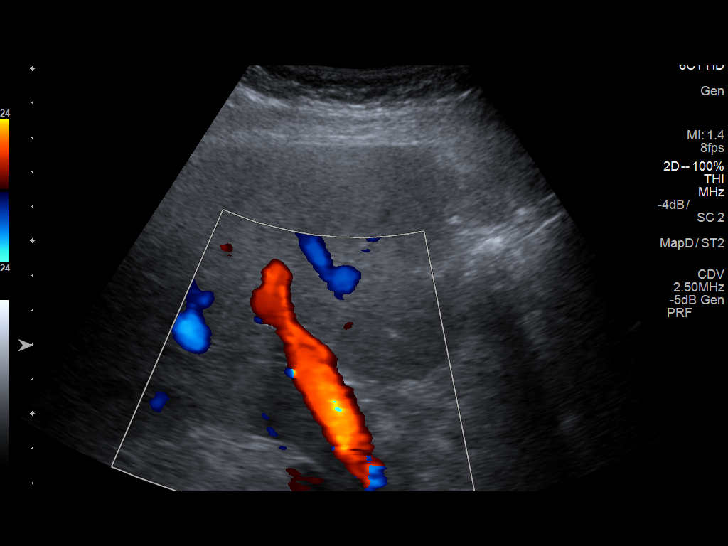

[14 of 25 positions shown; findings below may reference images not displayed]

FINDINGS: Gallbladder:

No gallstones or wall thickening visualized. The gallbladder is
contracted in appearance. No sonographic Murphy sign noted by
sonographer.

Common bile duct:

Diameter: 2.5 mm

Liver:

Anechoic well-circumscribed cyst in the right hepatic lobe measuring
1.3 cm in diameter. No solid-appearing masses. No biliary dilatation
is identified. Echogenic liver parenchyma.. Portal vein is patent on
color Doppler imaging with normal direction of blood flow towards
the liver.
IMPRESSION: 1. Anechoic simple appearing right hepatic cyst measuring 1.3 cm
diameter.
2. Mild diffuse increase in echogenicity of the liver parenchyma is
noted compatible with fatty infiltration.

## 2020-04-06 IMAGING — US US CAROTID DUPLEX BILAT
1 series · 13 of 24 positions shown · non-contrast
Comparison: None.

CLINICAL DATA: Syncope, shortness of breath. Hypertension, coronary
artery disease, previous tobacco abuse

EXAM:
BILATERAL CAROTID DUPLEX ULTRASOUND
TECHNIQUE: Gray scale imaging, color Doppler and duplex ultrasound was
performed of bilateral carotid and vertebral arteries in the neck.
TECHNIQUE: Quantification of carotid stenosis is based on velocity parameters
that correlate the residual internal carotid diameter with
NASCET-based stenosis levels, using the diameter of the distal
internal carotid lumen as the denominator for stenosis measurement.

[Series 1: us carotid duplex bilat · 0.08mm/px · 13 of 68 slices shown]
[im 1/68]
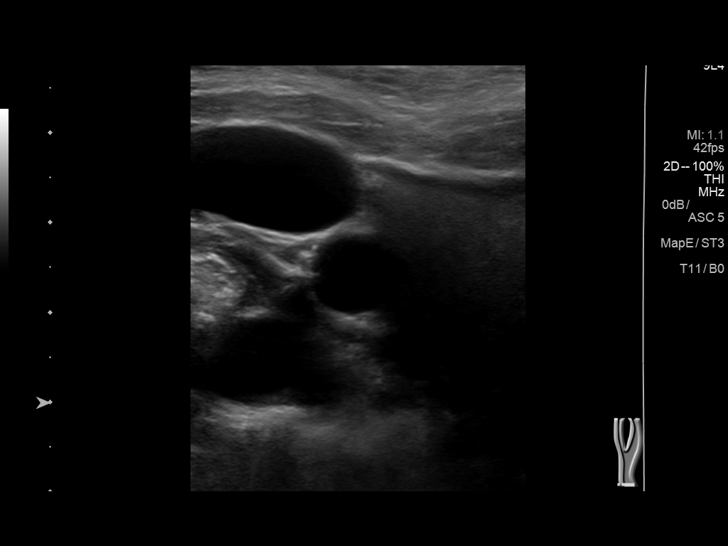
[im 6/68]
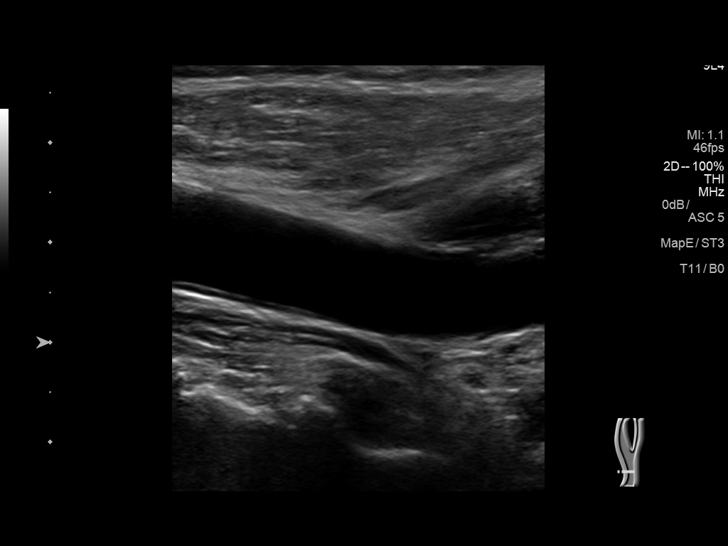
[im 12/68]
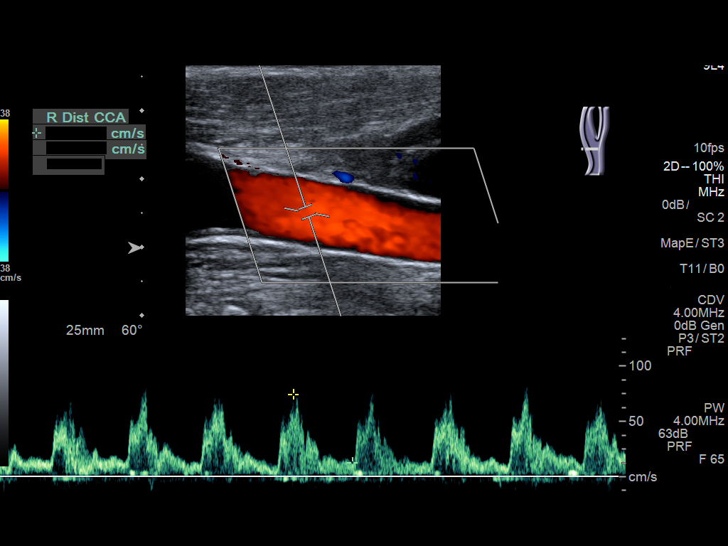
[im 18/68]
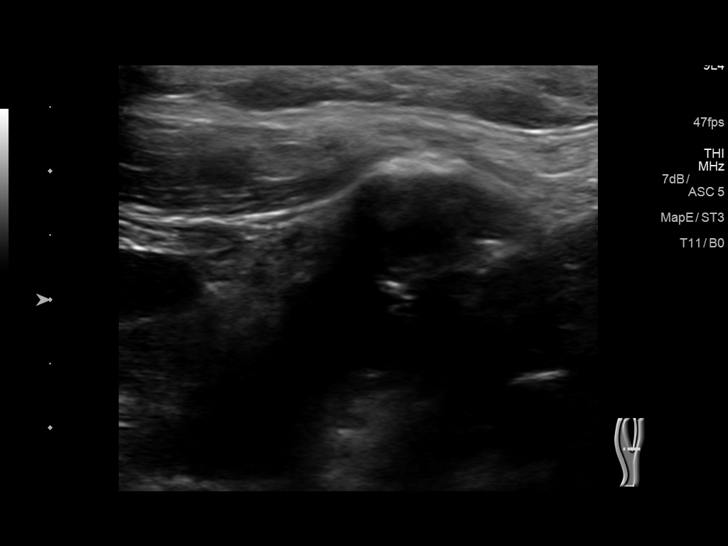
[im 24/68]
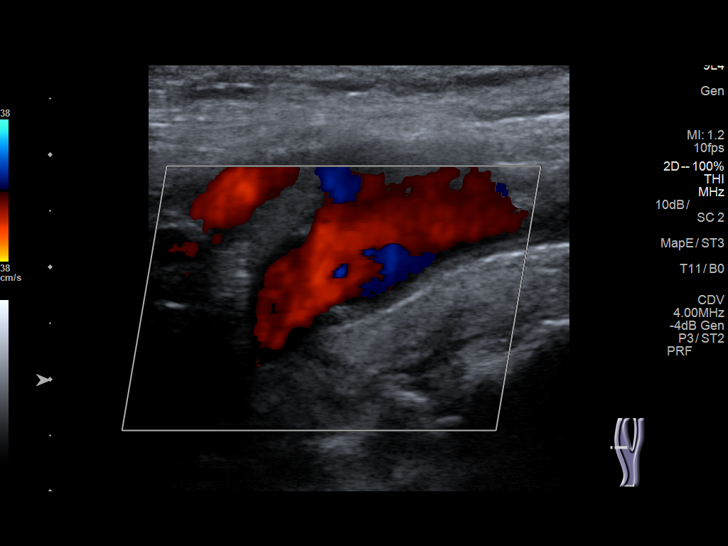
[im 30/68]
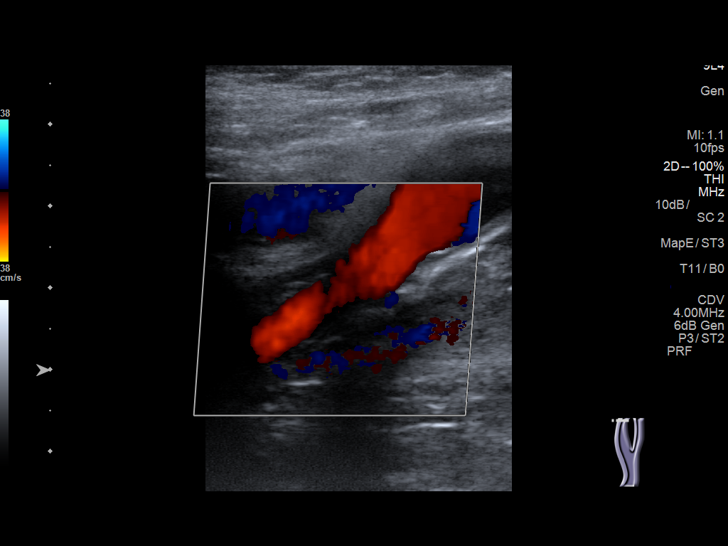
[im 35/68]
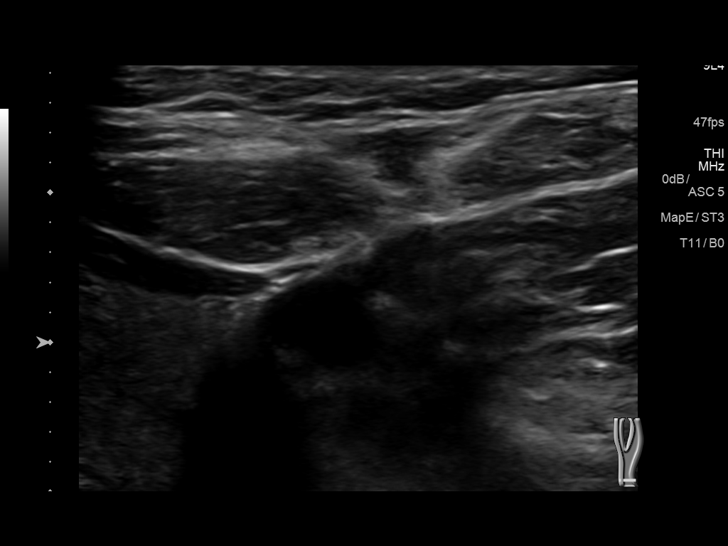
[im 38/68]
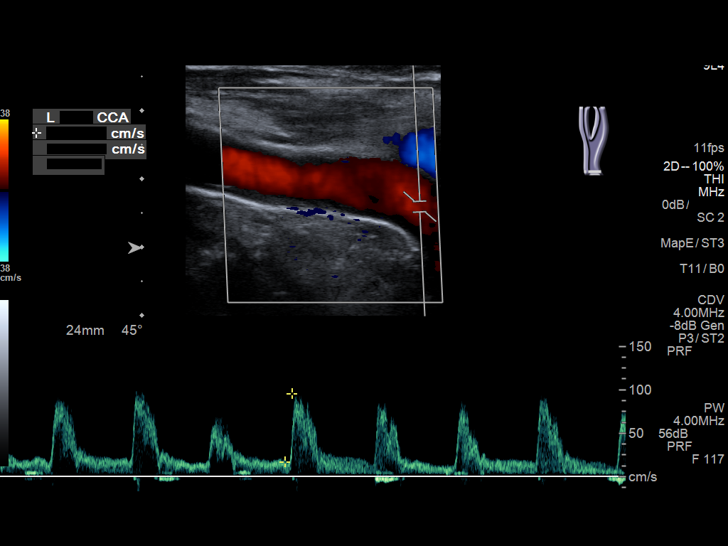
[im 44/68]
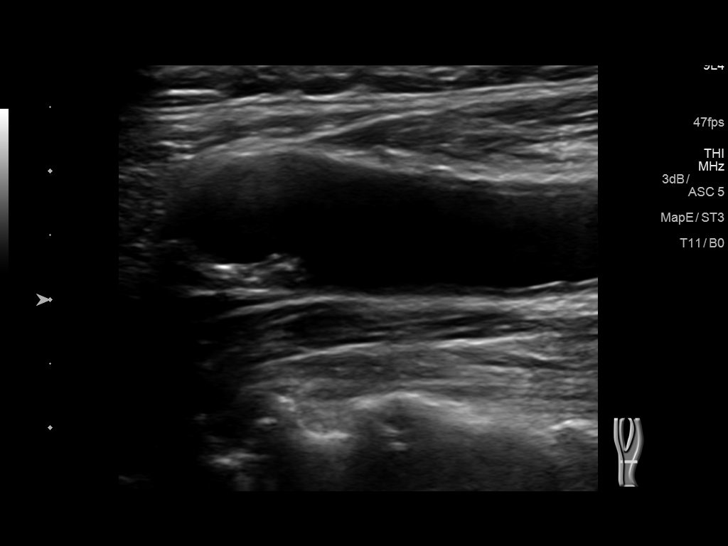
[im 50/68]
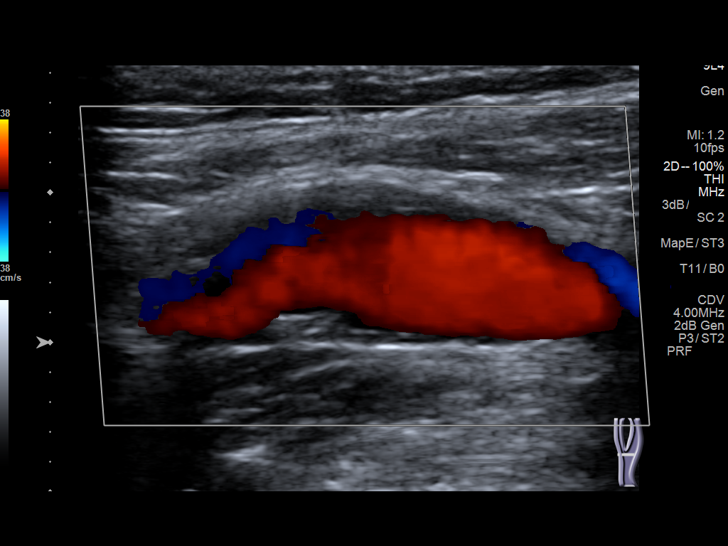
[im 56/68]
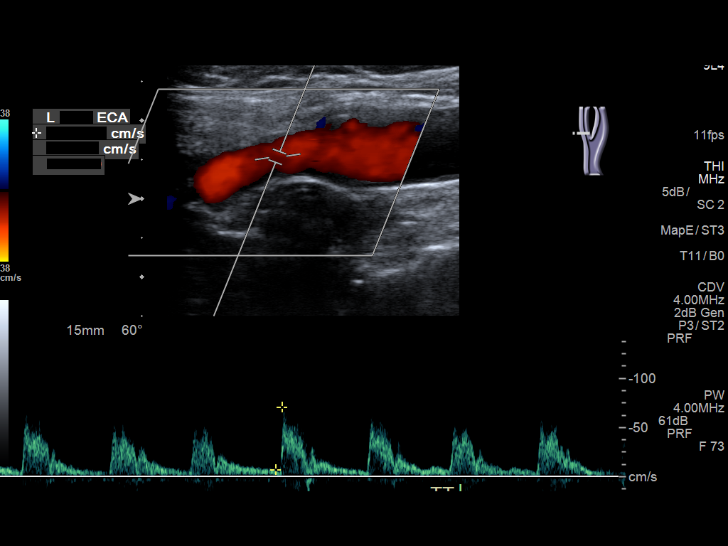
[im 62/68]
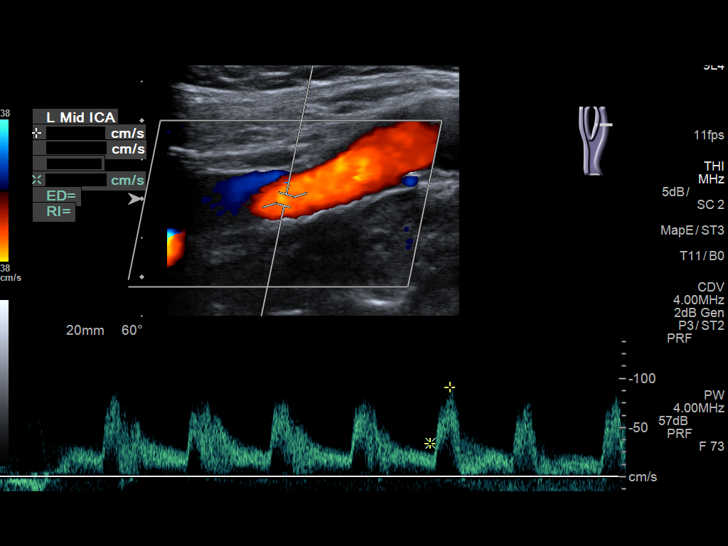
[im 68/68]
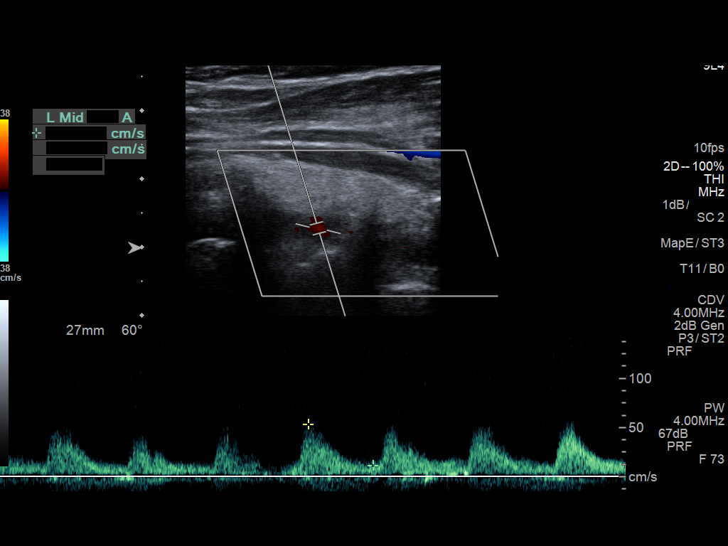

[13 of 24 positions shown; findings below may reference images not displayed]

The following velocity measurements were obtained:

PEAK SYSTOLIC/END DIASTOLIC

RIGHT

ICA:                     78/24cm/sec

CCA:                     68/14cm/sec

SYSTOLIC ICA/CCA RATIO:

ECA:                     83cm/sec

LEFT

ICA:                     134/21cm/sec

CCA:                     65/11cm/sec

SYSTOLIC ICA/CCA RATIO:

ECA:                     71cm/sec
FINDINGS: RIGHT CAROTID ARTERY: Mild nonocclusive plaque in the carotid bulb
and proximal ICA. No high-grade stenosis. Normal waveforms and color
Doppler signal.

RIGHT VERTEBRAL ARTERY:  Normal flow direction and waveform.

LEFT CAROTID ARTERY: Intimal thickening in the common carotid
artery. Eccentric partially calcified plaque in the bulb at the ICA
origin without high-grade stenosis. Normal waveforms and color
Doppler signal.

LEFT VERTEBRAL ARTERY: Normal flow direction and waveform.
IMPRESSION: 1. Mild bilateral carotid bifurcation and proximal ICA plaque
resulting in less than 50% diameter stenosis.
2.  Antegrade bilateral vertebral arterial flow.
